# Patient Record
Sex: Female | Born: 1965 | Race: White | Hispanic: No | Marital: Married | Smoking: Former smoker
Health system: Southern US, Community
[De-identification: ages and names within clinical notes are randomized; demographics above are authoritative.]

## PROBLEM LIST (undated history)

## (undated) DIAGNOSIS — R55 Syncope and collapse: Secondary | ICD-10-CM

## (undated) DIAGNOSIS — F32A Depression, unspecified: Secondary | ICD-10-CM

## (undated) DIAGNOSIS — G4700 Insomnia, unspecified: Secondary | ICD-10-CM

## (undated) DIAGNOSIS — M1990 Unspecified osteoarthritis, unspecified site: Secondary | ICD-10-CM

## (undated) DIAGNOSIS — E7800 Pure hypercholesterolemia, unspecified: Secondary | ICD-10-CM

## (undated) DIAGNOSIS — R569 Unspecified convulsions: Secondary | ICD-10-CM

## (undated) DIAGNOSIS — E039 Hypothyroidism, unspecified: Secondary | ICD-10-CM

## (undated) DIAGNOSIS — C539 Malignant neoplasm of cervix uteri, unspecified: Secondary | ICD-10-CM

## (undated) DIAGNOSIS — F419 Anxiety disorder, unspecified: Secondary | ICD-10-CM

## (undated) DIAGNOSIS — Z923 Personal history of irradiation: Secondary | ICD-10-CM

## (undated) DIAGNOSIS — M19079 Primary osteoarthritis, unspecified ankle and foot: Secondary | ICD-10-CM

## (undated) HISTORY — PX: ABDOMINAL HYSTERECTOMY: SHX81

## (undated) HISTORY — DX: Personal history of irradiation: Z92.3

## (undated) HISTORY — PX: WISDOM TOOTH EXTRACTION: SHX21

## (undated) HISTORY — PX: ANKLE SURGERY: SHX546

## (undated) HISTORY — DX: Depression, unspecified: F32.A

## (undated) HISTORY — DX: Insomnia, unspecified: G47.00

## (undated) HISTORY — DX: Primary osteoarthritis, unspecified ankle and foot: M19.079

## (undated) HISTORY — DX: Anxiety disorder, unspecified: F41.9

## (undated) HISTORY — DX: Pure hypercholesterolemia, unspecified: E78.00

## (undated) HISTORY — DX: Hypothyroidism, unspecified: E03.9

## (undated) HISTORY — DX: Syncope and collapse: R55

---

## 1998-03-06 ENCOUNTER — Other Ambulatory Visit: Admission: RE | Admit: 1998-03-06 | Discharge: 1998-03-06 | Payer: Self-pay | Admitting: Gynecology

## 2000-02-20 ENCOUNTER — Other Ambulatory Visit: Admission: RE | Admit: 2000-02-20 | Discharge: 2000-02-20 | Payer: Self-pay | Admitting: Gynecology

## 2001-03-11 ENCOUNTER — Encounter: Payer: Self-pay | Admitting: Internal Medicine

## 2001-03-11 ENCOUNTER — Encounter: Admission: RE | Admit: 2001-03-11 | Discharge: 2001-03-11 | Payer: Self-pay | Admitting: Internal Medicine

## 2001-11-03 ENCOUNTER — Other Ambulatory Visit: Admission: RE | Admit: 2001-11-03 | Discharge: 2001-11-03 | Payer: Self-pay | Admitting: Gynecology

## 2003-03-15 ENCOUNTER — Other Ambulatory Visit: Admission: RE | Admit: 2003-03-15 | Discharge: 2003-03-15 | Payer: Self-pay | Admitting: Gynecology

## 2004-04-11 ENCOUNTER — Other Ambulatory Visit: Admission: RE | Admit: 2004-04-11 | Discharge: 2004-04-11 | Payer: Self-pay | Admitting: Gynecology

## 2005-02-14 ENCOUNTER — Encounter: Admission: RE | Admit: 2005-02-14 | Discharge: 2005-02-14 | Payer: Self-pay | Admitting: Internal Medicine

## 2008-08-08 ENCOUNTER — Ambulatory Visit: Payer: Self-pay | Admitting: Internal Medicine

## 2010-04-25 ENCOUNTER — Encounter: Admission: RE | Admit: 2010-04-25 | Discharge: 2010-04-25 | Payer: Self-pay | Admitting: Obstetrics and Gynecology

## 2010-06-12 ENCOUNTER — Encounter: Payer: Self-pay | Admitting: Internal Medicine

## 2010-11-17 ENCOUNTER — Encounter (HOSPITAL_COMMUNITY): Payer: Self-pay | Admitting: Obstetrics and Gynecology

## 2010-11-26 NOTE — Miscellaneous (Signed)
Summary: Orders/[facility]  Orders/[facility]   Imported By: Lester  06/17/2010 08:42:47  _____________________________________________________________________  External Attachment:    Type:   Image     Comment:   External Document

## 2011-04-14 ENCOUNTER — Ambulatory Visit (INDEPENDENT_AMBULATORY_CARE_PROVIDER_SITE_OTHER): Payer: BC Managed Care – PPO | Admitting: Internal Medicine

## 2011-04-14 ENCOUNTER — Encounter: Payer: Self-pay | Admitting: Internal Medicine

## 2011-04-14 VITALS — BP 124/86 | HR 82 | Temp 97.7°F | Ht 69.0 in | Wt 167.0 lb

## 2011-04-14 DIAGNOSIS — G4700 Insomnia, unspecified: Secondary | ICD-10-CM

## 2011-04-23 ENCOUNTER — Encounter: Payer: Self-pay | Admitting: Internal Medicine

## 2011-04-23 DIAGNOSIS — G4700 Insomnia, unspecified: Secondary | ICD-10-CM | POA: Insufficient documentation

## 2011-04-23 NOTE — Progress Notes (Signed)
  Subjective:    Patient ID: Ashley Hammond, female    DOB: 1966-02-24, 45 y.o.   MRN: [id number]  HPI not seen since 2009. In today with complaint of cough. No documented fever or chills. Started with recent respiratory infection. Patient initially thought it could be allergy related. Over-the-counter medications have not helped. Cough is nonproductive. Has been going on a couple of weeks. Also continues to have trouble with insomnia. Asking for refill on Ambien    Review of Systems     Objective:   Physical Exam HEENT: Pharynx is clear TMs are clear neck is supple without adenopathy chest clear to auscultation without rales or wheezing        Assessment & Plan:  Bronchitis  Insomnia  Samples of Zutipro cough syrup- one tsp hs for 7-10 days as needed. Prescription for Biaxin 500 mg bid for 10 days. Refill Ambien 10mg  (#30) with no refill 1/2-1 tab hs prn sleep. Pharmacy is [facility] [phone]

## 2011-04-23 NOTE — Patient Instructions (Signed)
Take meds as prescribed- call if not better in 2 weeks or sooner for

## 2013-07-26 ENCOUNTER — Encounter: Payer: Self-pay | Admitting: Family Medicine

## 2013-07-26 ENCOUNTER — Ambulatory Visit (INDEPENDENT_AMBULATORY_CARE_PROVIDER_SITE_OTHER): Payer: No Typology Code available for payment source | Admitting: Family Medicine

## 2013-07-26 VITALS — BP 147/75 | HR 94 | Ht 69.0 in | Wt 175.0 lb

## 2013-07-26 DIAGNOSIS — M25579 Pain in unspecified ankle and joints of unspecified foot: Secondary | ICD-10-CM

## 2013-07-26 DIAGNOSIS — M25571 Pain in right ankle and joints of right foot: Secondary | ICD-10-CM

## 2013-07-26 NOTE — Patient Instructions (Addendum)
You have ankle instability. Do home ankle strengthening exercises 3 sets of 10 once a day at least the next 6 weeks. Icing as needed 15 minutes at a time 3-4 times a day. Ibuprofen 600mg  three times a day with food OR aleve 2 tabs twice a day with food for pain and inflammation. Wear orthotics regularly when up and walking around. Small heel lift on right side - see if this makes a difference over the next few weeks. If the cyst is bothering you we can aspirate and inject it. Consider Dr. Jari Sportsman active series insoles.  We could also make you new custom orthotics when these ones break down (I suspect it will be about 6 more months before they break down). Consider formal physical therapy. Follow up with me as needed.

## 2013-07-27 ENCOUNTER — Encounter: Payer: Self-pay | Admitting: Family Medicine

## 2013-07-27 DIAGNOSIS — M25571 Pain in right ankle and joints of right foot: Secondary | ICD-10-CM | POA: Insufficient documentation

## 2013-07-27 NOTE — Assessment & Plan Note (Signed)
exam benign currently except for instability.  Pain is likely related to this, mild overpronation with ankle subluxation.  Start by using orthotics regularly (current ones ok but likely to break down over next 4-6 months).  Home exercise program reviewed for ankle.  Small heel lift for right shoe.  Nsaids, icing, rest as much as possible.  MSK u/s showed swollen area is a cyst, not bony.  Consider formal PT, new orthotics if not improving.

## 2013-07-27 NOTE — Progress Notes (Signed)
Patient ID: Ashley Hammond, female   DOB: 07/12/1966, 47 y.o.   MRN: [id number]  PCP: No primary provider on file.  Subjective:   HPI: Patient is a 47 y.o. female here for right ankle pain.  Patient reports she has had over 4 years of right ankle/foot pain. She stands a lot at work and notices pain is worse after work but not in one specific place. Can be in different locations of foot/ankle. New swollen area medial ankle past 2 months but not hurting here currently. Was having knee pain this side also but this has resolved. Wears custom orthotics made June 2013 - some benefit with these. Has not tried physical therapy, other treatments.  History reviewed. No pertinent past medical history.  No current outpatient prescriptions on file prior to visit.   No current facility-administered medications on file prior to visit.    History reviewed. No pertinent past surgical history.  No Known Allergies  History   Social History  . Marital Status: Single    Spouse Name: N/A    Number of Children: N/A  . Years of Education: N/A   Occupational History  . Not on file.   Social History Main Topics  . Smoking status: Current Every Day Smoker -- 0.30 packs/day    Types: Cigarettes  . Smokeless tobacco: Never Used  . Alcohol Use: 4.2 oz/week    7 Glasses of wine per week  . Drug Use: Not on file  . Sexual Activity: Not on file   Other Topics Concern  . Not on file   Social History Narrative  . No narrative on file    Family History  Problem Relation Age of Onset  . Diabetes Brother   . Sudden death Neg Hx   . Hyperlipidemia Neg Hx   . Heart attack Neg Hx   . Hypertension Neg Hx     BP 147/75  Pulse 94  Ht 5\' 9"  (1.753 m)  Wt 175 lb (79.379 kg)  BMI 25.83 kg/m2  Review of Systems: See HPI above.    Objective:  Physical Exam:  Gen: NAD  Right ankle/foot: Firm circumferential swollen area over medial malleolus.  No other bruising, deformity.  Ankle with  mild subluxation when standing.  Mild overpronation. FROM without pain.  5/5 strength. No TTP currently throughout foot/ankle. 1+ ant drawer and talar tilt.   Negative syndesmotic compression. Thompsons test negative. NV intact distally. Right leg 92cm, left 92.5cm    Assessment & Plan:  1. Right ankle/foot pain - exam benign currently except for instability.  Pain is likely related to this, mild overpronation with ankle subluxation.  Start by using orthotics regularly (current ones ok but likely to break down over next 4-6 months).  Home exercise program reviewed for ankle.  Small heel lift for right shoe.  Nsaids, icing, rest as much as possible.  MSK u/s showed swollen area is a cyst, not bony.  Consider formal PT, new orthotics if not improving.

## 2014-01-17 ENCOUNTER — Ambulatory Visit: Payer: Self-pay | Admitting: Internal Medicine

## 2014-01-17 ENCOUNTER — Telehealth: Payer: Self-pay | Admitting: Internal Medicine

## 2014-01-17 NOTE — Telephone Encounter (Signed)
Not seen since 2012. Made appt today for sinus infection symptoms then called later in the morning to cancel appt.

## 2017-05-04 ENCOUNTER — Ambulatory Visit
Admission: RE | Admit: 2017-05-04 | Discharge: 2017-05-04 | Disposition: A | Discharge status: Home / Self Care | Payer: 59 | Source: Ambulatory Visit | Attending: Sports Medicine | Admitting: Sports Medicine

## 2017-05-04 ENCOUNTER — Encounter: Payer: Self-pay | Admitting: Sports Medicine

## 2017-05-04 ENCOUNTER — Ambulatory Visit (INDEPENDENT_AMBULATORY_CARE_PROVIDER_SITE_OTHER): Payer: 59 | Admitting: Sports Medicine

## 2017-05-04 VITALS — BP 132/88 | Ht 69.0 in | Wt 180.0 lb

## 2017-05-04 DIAGNOSIS — M25512 Pain in left shoulder: Secondary | ICD-10-CM

## 2017-05-04 DIAGNOSIS — M25571 Pain in right ankle and joints of right foot: Secondary | ICD-10-CM

## 2017-05-04 NOTE — Progress Notes (Signed)
Subjective:    Patient ID: Ashley Hammond , female   DOB: September 27, 1966 , 51 y.o..   MRN: [id number]  HPI  CYANNA NEACE is here for:  1. Right ankle pain/swelling: Chronic and intermittent. Patient has a history of intermittient right foot/ankle pain and swelling x 8 years. No previous trauma. Was last seen in 2014 for right ankle pain and was given shoe inserts. She has not ever tried any physical therapy. Patient notes that at one point she had a cyst on the medial aspect of her ankle but this disappeared years ago. She notes that sometimes she feels like her foot catches and goes out that she has never actually lost her balance. Her pain and swelling is better with rest and ice. 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 She does wear shoe inserts in her sneakers but these are very old and she doesn't think that they help anymore. Denies any weakness, tingling, numbness.  2. Left great Toe deviation: Started 1 year ago. Patient notes that the left great toe has been gradually moving medially. At one point her toe was rubbing against her shoes often that her toenail fell off. She denies any pain, weakness, numbness, tingling. She was just worried that all of a sudden her toe started moving away from the rest.  3. Left shoulder pain: Started about 1 year ago. Patient notes that the pain is intermittent and occurs several times a week. No trauma or inciting event. 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 She notes that the pain will worsen with increased level of activity. The pain gets better when she is not lifting heavy objects. Admits to some intermittent neck pain but is not clearly related to the shoulder. Denies any weakness, numbness, tingling.  Review of Systems: Per HPI.   Past Medical History: Patient Active Problem List   Diagnosis Date Noted  . Right ankle pain 07/27/2013  . Insomnia 04/23/2011     Medications: None  Social Hx:  reports that she has been smoking Cigarettes.  She has been smoking about 0.30 packs per day. She has never used smokeless tobacco.   Objective:   BP 132/88   Ht 5\' 9"  (1.753 m)   Wt 180 lb (81.6 kg)   BMI 26.58 kg/m  Physical Exam  Gen: NAD, alert, cooperative with exam, well-appearing  Left Shoulder: Inspection reveals no abnormalities, atrophy or asymmetry. Palpation is normal with no tenderness over AC joint or bicipital groove. ROM is full in all planes. Rotator cuff strength normal throughout with exception of 4-5 strength on external rotation  No signs of impingement with negative Neer and Hawkin's tests, empty can sign. Speeds and Yergason's tests normal. Normal scapular function observed. No painful arc and no drop arm sign.  Right Ankle & Foot: Visible swelling in medial and lateral aspects of ankle. Foot tends to supinate. No ecchymosis, erythema, ulcers, calluses, blister Arch: Slight Pes cavus  Toes: no claw or hammer deformity  Nontender throughout Full in plantarflexion, dorsiflexion, inversion, and eversion of the foot; flexion and extension of the toes Strength: 5/5 in all directions. Sensation: intact Stable lateral and medial ligaments; Negative Anterior drawer test Vascular: intact w/ dorsalis pedis & posterior tibialis pulses 2+  Left Ankle & Foot: Great toe medially deviated. Foot tends to supinate.  No visible swelling, ecchymosis, erythema, ulcers, calluses, blister Arch: Slight  Pes cavus  Toes: no claw or hammer deformity  Nontender throughout Toes: no claw or hammer deformity  Nontender throughout Full in plantarflexion, dorsiflexion, inversion, and eversion of the foot; flexion and extension of the toes Strength: 5/5 in all directions. Sensation: intact Stable lateral and medial ligaments; Negative Anterior drawer test Vascular: intact w/ dorsalis pedis & posterior tibialis pulses 2+  Assessment & Plan:     1. Right ankle pain and swelling: Exam showing swelling in medial and lateral compartments with a slightly supinated foot. Suspect that this pain and swelling is coming from foot supination deviation.  -Patient will need custom foot orthotics. Will come in to the office in 1 month to receive custom orthotic pads with bilateral fifth ray post's  - We'll obtain a right ankle x-ray -Discussed wearing supportive shoes -Right ankle compression sleeve  -NSAIDS PRN  2. Left great toe medial deviation: Patient has collapsible transverse arch on left foot causing left great toe medial deviation.  This could be due to her slight supination deviation of the foot but unclear.  -Patient will need custom foot orthotics. Will come in to the office in 1 month to receive custom orthotic pads with bilateral fifth ray post's as well as a left metatarsal pad.  -Discussed wearing supportive shoes  3. Left shoulder pain: Likely secondary to some infraspinatus tendinopathy -NSAIDS PRN -Avoid heavy lifting if possible -Discussed strengthening exercises -Return in 1 month  Smitty Cords, MD [place], PGY-3  Patient seen and evaluated with the resident. I agree with the above plan of care. We will get an x-ray of her right ankle. She'll try a compression sleeve and she will follow-up in one month for custom orthotics. We will need to do a gait analysis as I suspect she supinates and will need bilateral fifth ray posts. Her left shoulder pain is likely secondary to some infraspinatus tendinopathy. We've given her a Jobe home exercise program and we will reevaluate this again at follow-up.

## 2017-05-27 ENCOUNTER — Encounter: Payer: 59 | Admitting: Family Medicine

## 2017-06-01 ENCOUNTER — Encounter: Payer: 59 | Admitting: Sports Medicine

## 2017-06-15 ENCOUNTER — Ambulatory Visit (INDEPENDENT_AMBULATORY_CARE_PROVIDER_SITE_OTHER): Payer: 59 | Admitting: Sports Medicine

## 2017-06-15 ENCOUNTER — Encounter: Payer: Self-pay | Admitting: Sports Medicine

## 2017-06-15 VITALS — BP 118/86 | Ht 69.0 in | Wt 180.0 lb

## 2017-06-15 DIAGNOSIS — M25571 Pain in right ankle and joints of right foot: Secondary | ICD-10-CM

## 2017-06-15 DIAGNOSIS — G8929 Other chronic pain: Secondary | ICD-10-CM

## 2017-06-15 NOTE — Progress Notes (Signed)
   [facility] [address] [place], [place] [address] Phone: [phone] Fax: [phone]   Patient Name: Ashley Hammond Date of Birth: 23-Apr-1966 Medical Record Number: [id number] Gender: female Date of Encounter: 06/15/2017  History of Present Illness:  Ashley Hammond is a 51 y.o. very pleasant female patient who presents with the following:  Right ankle pain: Patient was seen in July for +23yr history of right ankle swelling and pain. At the time, plan was to obtain Xray of R ankle, use NSAIDs PRN and use a R ankle compression sleeve. XR showed lateral ligamentous disruption. She returns today for f/u and for custom orthotics. Patient states that she has had custom orthotics for the last couple of years which do provide some relief of her pain - she wears them even in the house to provide relief with daily activities. Patient infrequently uses NSAIDs with minimal relief. She has not used ice consistently. She did not tolerate the ankle compression sleeve. She denies injury to ankle since last visit or significant change.   Past Medical, Surgical, Social, and Family History Reviewed. Medications and Allergies reviewed and all updated if necessary.  Review of Systems:  Per HPI  Physical Examination: Vitals:   06/15/17 1346  BP: 118/86   Vitals:   06/15/17 1346  Weight: 180 lb (81.6 kg)  Height: 5\' 9"  (1.753 m)   Body mass index is 26.58 kg/m.  Constitutional: NAD, pleasant Left LE: 1+ talar tilt; arch collapse Right LE: mild soft tissue swelling at ankle; no point tenderness; negative anterior drawer test at ATF; 1+ talar tilt  Assessment and Plan:  Right ankle pain and swelling: Patient with multiple years of right ankle pain and swelling with XR evidence of lateral ligamentous laxity, though exam is much less remarkable. At this time, patient's symptoms and exam do not warrant surgical intervention, however we did discuss  possibility in the future if symptoms were to become intolerable with supportive care. She was fitted with size 10 custom orthotics today in office with added cushion.  Plan: --advised on consistent use of orthotics and supportive shoes when able --advised on use of ice --f/u PRN  Alphonzo Grieve, MD   Patient seen and evaluated with the resident. I agree with the above plan of care. Custom orthotics were constructed for the patient today. A total of 30 minutes was spent with the patient with greater than 50% of the time spent in face-to-face consultation discussing orthotic construction, instruction, and fitting. Patient's x-ray shows evidence of chronic lateral ankle laxity/instability but she does not have significant lateral instability on clinical exam. She is not interested in surgery. Hopefully the custom orthotics will help. Follow-up as needed.

## 2017-06-15 NOTE — Assessment & Plan Note (Signed)
Patient with multiple years of right ankle pain and swelling with XR evidence of lateral ligamentous laxity, though exam is much less remarkable. At this time, patient's symptoms and exam do not warrant surgical intervention, however we did discuss possibility in the future if symptoms were to become intolerable with supportive care. She was fitted with size 10 custom orthotics today in office with added cushion.  Plan: --advised on consistent use of orthotics and supportive shoes when able --advised on use of ice --f/u PRN

## 2018-03-02 ENCOUNTER — Telehealth: Payer: Self-pay

## 2018-03-02 NOTE — Telephone Encounter (Signed)
CB# [phone] (M)

## 2018-03-02 NOTE — Telephone Encounter (Signed)
Patient called is wanting to come in to see Dr. Renold Genta she c/o fatigue and "is having trouble taking in a deep breath". Spoke with Dr. Renold Genta about it and she agreed to see her tomorrow at 4:00pm called patient with no answer.

## 2018-03-10 ENCOUNTER — Ambulatory Visit (INDEPENDENT_AMBULATORY_CARE_PROVIDER_SITE_OTHER): Payer: No Typology Code available for payment source | Admitting: Internal Medicine

## 2018-03-10 ENCOUNTER — Encounter: Payer: Self-pay | Admitting: Internal Medicine

## 2018-03-10 VITALS — BP 126/98 | HR 87 | Ht 69.0 in

## 2018-03-10 DIAGNOSIS — R748 Abnormal levels of other serum enzymes: Secondary | ICD-10-CM

## 2018-03-10 DIAGNOSIS — E559 Vitamin D deficiency, unspecified: Secondary | ICD-10-CM | POA: Diagnosis not present

## 2018-03-10 DIAGNOSIS — R7989 Other specified abnormal findings of blood chemistry: Secondary | ICD-10-CM | POA: Diagnosis not present

## 2018-03-10 DIAGNOSIS — Z0000 Encounter for general adult medical examination without abnormal findings: Secondary | ICD-10-CM

## 2018-03-10 DIAGNOSIS — E7800 Pure hypercholesterolemia, unspecified: Secondary | ICD-10-CM

## 2018-03-10 DIAGNOSIS — Z1231 Encounter for screening mammogram for malignant neoplasm of breast: Secondary | ICD-10-CM | POA: Diagnosis not present

## 2018-03-10 DIAGNOSIS — R5383 Other fatigue: Secondary | ICD-10-CM

## 2018-03-10 DIAGNOSIS — G4709 Other insomnia: Secondary | ICD-10-CM

## 2018-03-10 MED ORDER — BUPROPION HCL ER (XL) 150 MG PO TB24: 150.0000 mg | ORAL_TABLET | Freq: Every day | ORAL | 0 refills | Status: DC

## 2018-03-10 NOTE — Progress Notes (Signed)
   Subjective:    Patient ID: Ashley Hammond, female    DOB: 1966/01/31, 52 y.o.   MRN: [id number]  HPI 52 year old White Female not seen here since June 2012 in today for physical examination and evaluation of fatigue.  She also has chronic right ankle issues.  Says her husband is retired.  He previously worked for [employer].  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  She has been in business for about 26 years.  Says husband had onset of heart failure last fall and was hospitalized.  They had large medical bills as a result despite having medical insurance.  Has seen Dr. Gaetano Net for GYN exam in the past..  No surgeries.  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  Completed 4 years of college.  History of smoking about 5 cigarettes daily.  Social alcohol consumption.  Family history: One brother with history of diabetes.  Another brother alive and well.  She has a daughter and a son in good health.      Review of Systems issues recently with fatigue and malaise which she thinks may be due to depression and the fact that her ankle hurts.  She cannot exercise as much as she would like to.  Wants to see a specialist regarding ankle issue.  Issues with insomnia.  Her husband has had some heart issues and she thinks that also contributed to stress and depression.  They had a large hospital bill and had to pay out of pocket.     Objective:   Physical Exam        Assessment & Plan:  Fatigue-possibly depression-trial of Wellbutrin and follow-up in 4 to 6 weeks  Chronic right ankle pain-patient thinks she would like to see Dr. Doran [facility] at [facility]  Mild elevation of SGOT/SGPT.  Could be due to alcohol consumption.  Does not take excessive amount of analgesics.  Elevated LDL cholesterol-watch diet.  Cannot exercise due to ankle  pain.  Will need follow-up in 6 months.  She has a high HDL cholesterol  Health maintenance-have mammogram  Insomnia-prescription for Ambien 10 mg at bedtime  Vitamin D deficiency-take 2000 units vitamin D3 daily  Chronic right ankle pain-is seen Dr. Micheline Chapman.  Was prescribed custom orthotics but has not helped her right ankle pain.  X-ray of ankle July 2018 was unremarkable other than mild soft tissue swelling medially and laterally.  Thought to have lateral ligamentous disruption.

## 2018-03-12 ENCOUNTER — Other Ambulatory Visit: Payer: Self-pay | Admitting: Internal Medicine

## 2018-03-12 DIAGNOSIS — R5383 Other fatigue: Secondary | ICD-10-CM

## 2018-03-15 LAB — COMPLETE METABOLIC PANEL WITH GFR
AG RATIO: 1.7 (calc) (ref 1.0–2.5)
ALBUMIN MSPROF: 4.6 g/dL (ref 3.6–5.1)
ALT: 74 U/L — ABNORMAL HIGH (ref 6–29)
AST: 46 U/L — AB (ref 10–35)
Alkaline phosphatase (APISO): 93 U/L (ref 33–130)
BUN: 10 mg/dL (ref 7–25)
CALCIUM: 9.8 mg/dL (ref 8.6–10.4)
CHLORIDE: 103 mmol/L (ref 98–110)
CO2: 27 mmol/L (ref 20–32)
Creat: 0.67 mg/dL (ref 0.50–1.05)
GFR, EST NON AFRICAN AMERICAN: 100 mL/min/{1.73_m2} (ref >=60)
GFR, Est African American: 116 mL/min/{1.73_m2} (ref >=60)
GLOBULIN: 2.7 g/dL (ref 1.9–3.7)
Glucose, Bld: 100 mg/dL — ABNORMAL HIGH (ref 65–99)
Potassium: 4.6 mmol/L (ref 3.5–5.3)
SODIUM: 141 mmol/L (ref 135–146)
Total Bilirubin: 0.7 mg/dL (ref 0.2–1.2)
Total Protein: 7.3 g/dL (ref 6.1–8.1)

## 2018-03-15 LAB — CBC WITH DIFFERENTIAL/PLATELET
BASOS ABS: 47 {cells}/uL (ref 0–200)
Basophils Relative: 0.7 %
EOS ABS: 101 {cells}/uL (ref 15–500)
EOS PCT: 1.5 %
HEMATOCRIT: 46.4 % — AB (ref 35.0–45.0)
HEMOGLOBIN: 16.1 g/dL — AB (ref 11.7–15.5)
LYMPHS ABS: 2104 {cells}/uL (ref 850–3900)
MCH: 32.7 pg (ref 27.0–33.0)
MCHC: 34.7 g/dL (ref 32.0–36.0)
MCV: 94.1 fL (ref 80.0–100.0)
MPV: 10.3 fL (ref 7.5–12.5)
Monocytes Relative: 8.4 %
NEUTROS ABS: 3886 {cells}/uL (ref 1500–7800)
NEUTROS PCT: 58 %
Platelets: 244 10*3/uL (ref 140–400)
RBC: 4.93 10*6/uL (ref 3.80–5.10)
RDW: 11.4 % (ref 11.0–15.0)
Total Lymphocyte: 31.4 %
WBC: 6.7 10*3/uL (ref 3.8–10.8)
WBCMIX: 563 {cells}/uL (ref 200–950)

## 2018-03-15 LAB — LIPID PANEL
CHOL/HDL RATIO: 2.4 (calc) (ref <5.0)
CHOLESTEROL: 270 mg/dL — AB (ref <200)
HDL: 112 mg/dL (ref >50)
LDL Cholesterol (Calc): 142 mg/dL (calc) — ABNORMAL HIGH
Non-HDL Cholesterol (Calc): 158 mg/dL (calc) — ABNORMAL HIGH (ref <130)
Triglycerides: 72 mg/dL (ref <150)

## 2018-03-15 LAB — TEST AUTHORIZATION 2

## 2018-03-15 LAB — VITAMIN B12: Vitamin B-12: 382 pg/mL (ref 200–1100)

## 2018-03-15 LAB — TSH: TSH: 3.82 m[IU]/L

## 2018-03-15 LAB — VITAMIN D 25 HYDROXY (VIT D DEFICIENCY, FRACTURES): VIT D 25 HYDROXY: 22 ng/mL — AB (ref 30–100)

## 2018-03-24 ENCOUNTER — Encounter: Payer: Self-pay | Admitting: Internal Medicine

## 2018-03-24 NOTE — Patient Instructions (Addendum)
Take Ambien 10 mg at bedtime for sleep and start Wellbutrin XL 150 mg daily.  Follow-up in 4 to 6 weeks.  Take 2000 units vitamin D3 daily.  We need to repeat liver functions upon your return.

## 2018-04-02 DIAGNOSIS — M19071 Primary osteoarthritis, right ankle and foot: Secondary | ICD-10-CM | POA: Insufficient documentation

## 2018-04-02 DIAGNOSIS — M2032 Hallux varus (acquired), left foot: Secondary | ICD-10-CM | POA: Insufficient documentation

## 2018-04-05 ENCOUNTER — Telehealth: Payer: Self-pay

## 2018-04-05 NOTE — Telephone Encounter (Signed)
Patient walked in this afternoon.  States that she was at her eye doctor and thought it would just be better if she just came by to state what she needed.  States that she is thinking she may need an increase in her Wellbutrin XL 150mg .  She has never been on this medication before.  She is still really tired and we are almost 1 month into the medication.    Provided med appointment for Thursday, 6/13 @ 4pm.

## 2018-04-08 ENCOUNTER — Encounter: Payer: Self-pay | Admitting: Internal Medicine

## 2018-04-08 ENCOUNTER — Ambulatory Visit (INDEPENDENT_AMBULATORY_CARE_PROVIDER_SITE_OTHER): Payer: No Typology Code available for payment source | Admitting: Internal Medicine

## 2018-04-08 VITALS — BP 128/90 | HR 74 | Temp 98.6°F | Wt 200.0 lb

## 2018-04-08 DIAGNOSIS — M25571 Pain in right ankle and joints of right foot: Secondary | ICD-10-CM

## 2018-04-08 DIAGNOSIS — G4709 Other insomnia: Secondary | ICD-10-CM

## 2018-04-08 DIAGNOSIS — E7800 Pure hypercholesterolemia, unspecified: Secondary | ICD-10-CM | POA: Diagnosis not present

## 2018-04-08 DIAGNOSIS — F324 Major depressive disorder, single episode, in partial remission: Secondary | ICD-10-CM | POA: Diagnosis not present

## 2018-04-08 DIAGNOSIS — R5383 Other fatigue: Secondary | ICD-10-CM | POA: Diagnosis not present

## 2018-04-08 DIAGNOSIS — G8929 Other chronic pain: Secondary | ICD-10-CM

## 2018-04-08 MED ORDER — BUPROPION HCL ER (XL) 300 MG PO TB24: 300.0000 mg | ORAL_TABLET | Freq: Every day | ORAL | 1 refills | Status: DC

## 2018-04-08 NOTE — Progress Notes (Signed)
   Subjective:    Patient ID: Ashley Hammond, female    DOB: 01-Mar-1966, 53 y.o.   MRN: [id number]  HPI Patient presented for physical exam and evaluation of anxiety depression in May.  She has been having chronic ankle pain for some time and recently saw Dr. Doran Durand who is referring her to do you to see a specialist regarding arthroplasty of foot and ankle.   At last visit, she was started on Wellbutrin 150 mg XL and began to feel better fairly quickly.  She takes  Ambien for sleep.  She is looking into getting different medical insurance which will cover her employees as well as she and her husband.  She would like to go up on the Wellbutrin.  She was also found to have significant hyperlipidemia with total cholesterol of 270 and LDL cholesterol of 142.  She feels this is due to inactivity due to her ankle pain.  Mentioned to her she can take Crestor 3 times weekly but she declines at this time.   Review of Systems     Objective:   Physical Exam  Not examined but spent 20 minutes speaking with her about these issues      Assessment & Plan:  Chronic right ankle pain-will be referred to [facility] by local orthopedist  Insomnia-continue with Ambien  Depression-improved as well as fatigue with Wellbutrin XL 150 mg daily so we will go up to XL 300 mg daily.  She will let me know how things are going by phone.  Pure hypercholesterolemia- she cannot exercise as much as she would like.  We will recheck lipid panel in 6 months.  She does not want to be on statin medication at this time.

## 2018-04-24 DIAGNOSIS — E7800 Pure hypercholesterolemia, unspecified: Secondary | ICD-10-CM | POA: Insufficient documentation

## 2018-04-24 NOTE — Patient Instructions (Signed)
Continue to work on diet and try to exercise when possible.  Increase Wellbutrin XL to 300 mg daily.  Repeat lipid panel in 6 months.

## 2018-06-04 ENCOUNTER — Other Ambulatory Visit: Payer: Self-pay | Admitting: Internal Medicine

## 2018-09-01 ENCOUNTER — Telehealth: Payer: Self-pay | Admitting: Internal Medicine

## 2018-09-01 NOTE — Telephone Encounter (Signed)
Verbal order to refill patients 300mg  Wellbutrin.  However, patient last seen in June, 2019.  Patient needs to have 6 month visit with Lipid panel done for pure hypercholersterolemia.  And 6 month OV.    Thank you.

## 2018-09-01 NOTE — Telephone Encounter (Signed)
LM for patient to call the office to set up appointment for 6 month follow up and fasting labs.  Once she does so, we are happy to refill medication.

## 2018-09-02 MED ORDER — BUPROPION HCL ER (XL) 300 MG PO TB24: 300.0000 mg | ORAL_TABLET | Freq: Every day | ORAL | 0 refills | Status: DC

## 2018-09-02 NOTE — Telephone Encounter (Signed)
Sent electronically 

## 2018-09-02 NOTE — Telephone Encounter (Signed)
Patient called back to book 6 month recheck.  Labs 12/3 @ 11:30 and 6 month recheck on 12/5 @ 3:45 p.m.    Please send medication to her pharmacy (enough to get her to her appointment).    Thank you.

## 2018-09-02 NOTE — Addendum Note (Signed)
Addended by: Mady Haagensen on: 09/02/2018 03:24 PM   Modules accepted: Orders

## 2018-09-28 ENCOUNTER — Other Ambulatory Visit: Payer: No Typology Code available for payment source | Admitting: Internal Medicine

## 2018-09-28 DIAGNOSIS — E7800 Pure hypercholesterolemia, unspecified: Secondary | ICD-10-CM

## 2018-09-28 LAB — LIPID PANEL
Cholesterol: 242 mg/dL — ABNORMAL HIGH (ref <200)
HDL: 121 mg/dL (ref >50)
LDL CHOLESTEROL (CALC): 106 mg/dL — AB
Non-HDL Cholesterol (Calc): 121 mg/dL (calc) (ref <130)
Total CHOL/HDL Ratio: 2 (calc) (ref <5.0)
Triglycerides: 61 mg/dL (ref <150)

## 2018-10-01 ENCOUNTER — Encounter: Payer: Self-pay | Admitting: Internal Medicine

## 2018-10-01 ENCOUNTER — Ambulatory Visit (INDEPENDENT_AMBULATORY_CARE_PROVIDER_SITE_OTHER): Payer: No Typology Code available for payment source | Admitting: Internal Medicine

## 2018-10-01 VITALS — BP 140/90 | HR 90 | Ht 69.0 in | Wt 188.0 lb

## 2018-10-01 DIAGNOSIS — G4709 Other insomnia: Secondary | ICD-10-CM | POA: Diagnosis not present

## 2018-10-01 DIAGNOSIS — F324 Major depressive disorder, single episode, in partial remission: Secondary | ICD-10-CM

## 2018-10-01 DIAGNOSIS — F411 Generalized anxiety disorder: Secondary | ICD-10-CM

## 2018-10-01 DIAGNOSIS — R5383 Other fatigue: Secondary | ICD-10-CM | POA: Diagnosis not present

## 2018-10-01 MED ORDER — ZOLPIDEM TARTRATE 10 MG PO TABS: ORAL_TABLET | ORAL | 2 refills | Status: DC

## 2018-10-01 MED ORDER — ALPRAZOLAM 0.5 MG PO TABS: 0.5000 mg | ORAL_TABLET | Freq: Every evening | ORAL | 0 refills | Status: DC | PRN

## 2018-10-01 MED ORDER — FLUOXETINE HCL 10 MG PO CAPS: 10.0000 mg | ORAL_CAPSULE | Freq: Every day | ORAL | 0 refills | Status: DC

## 2018-10-01 NOTE — Patient Instructions (Signed)
Xanax 0.5 mg twice a day as needed for anxiety surrounding surgery and labile hypertension. Continue Wellbutrin. Add Prozac 10 mg daily. Call with progress report around week of Christmas.

## 2018-10-25 NOTE — Progress Notes (Signed)
   Subjective:    Patient ID: Ashley Hammond, female    DOB: 04-16-1966, 52 y.o.   MRN: [id number]  HPI 52 year old Female in today to follow-up on several medical issues.  She is anxious about upcoming right ankle surgery at [facility].  Wellbutrin has worked well for her.  She felt more energetic and less depressed but would like to try something for anxiety.  Have given her Xanax to take for anxiety.  She will be started on Prozac 10 mg daily.  This SSRI might will help anxiety.  Ambien has been refilled.    Review of Systems remarks that her blood pressures been elevated in doctor's offices over the past several visits.  Likely due to anxiety.  Xanax may help with diet if she will take it prior to office visits.     Objective:   Physical Exam  Spent 15 minutes speaking with her about these issues and deciding on SSRI.  She wants to avoid weight gain.      Assessment & Plan:  Anxiety regarding upcoming ankle surgery at [facility].  Xanax prescribed  Depression-stable on Wellbutrin but will add Prozac for anxiety issues  Insomnia-has Ambien for sleep  Plan: She will call  with progress report regarding Prozac around week of Christmas.  Otherwise plan to see her May 2020.  Continue Wellbutrin.  Watch blood pressure.

## 2018-10-27 ENCOUNTER — Other Ambulatory Visit: Payer: Self-pay | Admitting: Internal Medicine

## 2018-10-27 DIAGNOSIS — C539 Malignant neoplasm of cervix uteri, unspecified: Secondary | ICD-10-CM

## 2018-10-27 HISTORY — DX: Malignant neoplasm of cervix uteri, unspecified: C53.9

## 2018-11-25 ENCOUNTER — Telehealth: Payer: Self-pay | Admitting: Internal Medicine

## 2018-11-25 ENCOUNTER — Encounter: Payer: Self-pay | Admitting: Internal Medicine

## 2018-11-25 MED ORDER — FLUOXETINE HCL 10 MG PO CAPS: ORAL_CAPSULE | ORAL | 1 refills | Status: DC

## 2018-11-25 NOTE — Telephone Encounter (Signed)
Generic Prozac refilled x 6 months

## 2018-12-31 ENCOUNTER — Other Ambulatory Visit: Payer: Self-pay | Admitting: Internal Medicine

## 2019-01-02 ENCOUNTER — Other Ambulatory Visit: Payer: Self-pay | Admitting: Internal Medicine

## 2019-01-02 NOTE — Telephone Encounter (Signed)
Needs CPE after May 15. Please book before refilling

## 2019-01-03 NOTE — Telephone Encounter (Signed)
Left message letting her know that she needs to schedule CPE before we can approve this.

## 2019-02-01 ENCOUNTER — Other Ambulatory Visit: Payer: Self-pay

## 2019-02-01 MED ORDER — ALPRAZOLAM 0.5 MG PO TABS: 0.5000 mg | ORAL_TABLET | Freq: Every evening | ORAL | 1 refills | Status: DC | PRN

## 2019-02-01 NOTE — Telephone Encounter (Signed)
Patient called back to schedule CPE in May and is requesting a refill.

## 2019-02-02 ENCOUNTER — Other Ambulatory Visit: Payer: Self-pay

## 2019-02-02 MED ORDER — BUPROPION HCL ER (XL) 300 MG PO TB24: ORAL_TABLET | ORAL | 2 refills | Status: DC

## 2019-03-22 ENCOUNTER — Other Ambulatory Visit: Payer: Self-pay

## 2019-03-22 ENCOUNTER — Other Ambulatory Visit: Payer: No Typology Code available for payment source | Admitting: Internal Medicine

## 2019-03-22 VITALS — Temp 97.9°F

## 2019-03-22 DIAGNOSIS — E7800 Pure hypercholesterolemia, unspecified: Secondary | ICD-10-CM

## 2019-03-22 DIAGNOSIS — E559 Vitamin D deficiency, unspecified: Secondary | ICD-10-CM

## 2019-03-22 DIAGNOSIS — G8929 Other chronic pain: Secondary | ICD-10-CM

## 2019-03-22 DIAGNOSIS — M25571 Pain in right ankle and joints of right foot: Secondary | ICD-10-CM

## 2019-03-22 DIAGNOSIS — F411 Generalized anxiety disorder: Secondary | ICD-10-CM

## 2019-03-22 DIAGNOSIS — Z0000 Encounter for general adult medical examination without abnormal findings: Secondary | ICD-10-CM

## 2019-03-22 DIAGNOSIS — F325 Major depressive disorder, single episode, in full remission: Secondary | ICD-10-CM

## 2019-03-22 DIAGNOSIS — G4709 Other insomnia: Secondary | ICD-10-CM

## 2019-03-24 ENCOUNTER — Ambulatory Visit (INDEPENDENT_AMBULATORY_CARE_PROVIDER_SITE_OTHER): Payer: 59 | Admitting: Internal Medicine

## 2019-03-24 ENCOUNTER — Other Ambulatory Visit: Payer: Self-pay

## 2019-03-24 ENCOUNTER — Encounter: Payer: Self-pay | Admitting: Internal Medicine

## 2019-03-24 VITALS — BP 140/90 | HR 104 | Ht 69.0 in | Wt 171.0 lb

## 2019-03-24 DIAGNOSIS — R748 Abnormal levels of other serum enzymes: Secondary | ICD-10-CM

## 2019-03-24 DIAGNOSIS — F419 Anxiety disorder, unspecified: Secondary | ICD-10-CM

## 2019-03-24 DIAGNOSIS — E039 Hypothyroidism, unspecified: Secondary | ICD-10-CM

## 2019-03-24 DIAGNOSIS — Z0000 Encounter for general adult medical examination without abnormal findings: Secondary | ICD-10-CM

## 2019-03-24 DIAGNOSIS — F32A Depression, unspecified: Secondary | ICD-10-CM

## 2019-03-24 DIAGNOSIS — F329 Major depressive disorder, single episode, unspecified: Secondary | ICD-10-CM

## 2019-03-24 DIAGNOSIS — E7800 Pure hypercholesterolemia, unspecified: Secondary | ICD-10-CM

## 2019-03-24 LAB — POCT URINALYSIS DIPSTICK
Appearance: NEGATIVE
Bilirubin, UA: NEGATIVE
Blood, UA: NEGATIVE
Glucose, UA: NEGATIVE
Ketones, UA: NEGATIVE
Leukocytes, UA: NEGATIVE
Nitrite, UA: NEGATIVE
Odor: NEGATIVE
Protein, UA: NEGATIVE
Spec Grav, UA: 1.015 (ref 1.010–1.025)
Urobilinogen, UA: 0.2 E.U./dL
pH, UA: 6.5 (ref 5.0–8.0)

## 2019-03-24 NOTE — Progress Notes (Signed)
   Subjective:    Patient ID: Ashley Hammond, female    DOB: April 28, 1966, 53 y.o.   MRN: [id number]  HPI 53 year old Female for health maintenance exam and evaluation of medical issues.  In January, had right ankle lateral ligament reconstruction surgery at [facility].  He has done well postoperatively.  No known drug allergies  History of depression, insomnia and vitamin D deficiency.  Recently went to the beach and consumed alcohol.  Now vitamin D level is high at 111.  Needs to cut back on amount of supplementation.  Her liver functions are elevated.  SGOT is 181 and SGPT is 162 and previously these were 46 and 74 a year ago.  She has significant hyperlipidemia with total cholesterol 273 and LDL cholesterol 117.  She has an elevated TSH of 4.54 and will be started on thyroid replacement therapy.  Hepatitis C antibody is negative.  CBC is within normal limits.  Was started on Prozac 10 mg daily in December 2019 because she was anxious about upcoming surgery.  Wellbutrin worked well for her fatigue and depression.  She is on 300 mg XL daily.  She has Ambien for insomnia.    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  24 years of college.  History of smoking about 5 cigarettes daily.  Family history: 1 brother with history of diabetes.  Another brother alive and.   Review of Systems continued issues with fatigue and malaise     Objective:   Physical Exam Vital signs reviewed.  Skin warm and dry.  Nodes none.  TMs and pharynx are clear.  Neck is supple without thyromegaly.  No carotid bruits.  Chest clear to auscultation.  Cardiac exam regular rate and rhythm normal S1 and S2 2.  Abdomen no hepatosplenomegaly masses or tenderness.  Extremities without edema.  Neuro no gross focal deficits.       Assessment & Plan:  Elevated liver functions-Hepatitis C  antibody obtained and is negative.  I am assuming her elevated liver functions are due to her recent beach trip with alcohol consumption and apparently was fairly heavy.  Follow-up in late June.  Cut back on alcohol consumption.  Discussion about this today.  Hypothyroidism-has elevated TSH and will be started on thyroid replacement therapy with follow-up in late June.  Anxiety and depression- continue current medications  Insomnia-has Ambien for sleep  Pure hypercholesterolemia-has not wanted to be on statin therapy.  May need to consider statin therapy if no improvement.  Needs to watch diet and exercise.  Status post right ankle reconstruction surgery at [facility] which appears to have been successful.  Elevated vitamin D level-cut back on vitamin D replacement.  Need to repeat level.    Plan: Follow-up here late June with repeat TSH, liver functions, lipid panel and office visit.  If depression not improving, she may need counseling.  Watch alcohol consumption.  Counseling provided.

## 2019-03-25 LAB — CBC WITH DIFFERENTIAL/PLATELET
Absolute Monocytes: 600 cells/uL (ref 200–950)
Basophils Absolute: 48 cells/uL (ref 0–200)
Basophils Relative: 0.6 %
Eosinophils Absolute: 88 cells/uL (ref 15–500)
Eosinophils Relative: 1.1 %
HCT: 44.8 % (ref 35.0–45.0)
Hemoglobin: 15.3 g/dL (ref 11.7–15.5)
Lymphs Abs: 1584 cells/uL (ref 850–3900)
MCH: 34.1 pg — ABNORMAL HIGH (ref 27.0–33.0)
MCHC: 34.2 g/dL (ref 32.0–36.0)
MCV: 99.8 fL (ref 80.0–100.0)
MPV: 10.3 fL (ref 7.5–12.5)
Monocytes Relative: 7.5 %
Neutro Abs: 5680 cells/uL (ref 1500–7800)
Neutrophils Relative %: 71 %
Platelets: 227 10*3/uL (ref 140–400)
RBC: 4.49 10*6/uL (ref 3.80–5.10)
RDW: 11.9 % (ref 11.0–15.0)
Total Lymphocyte: 19.8 %
WBC: 8 10*3/uL (ref 3.8–10.8)

## 2019-03-25 LAB — LIPID PANEL
Cholesterol: 273 mg/dL — ABNORMAL HIGH (ref <200)
HDL: 140 mg/dL (ref >=50)
LDL Cholesterol (Calc): 117 mg/dL (calc) — ABNORMAL HIGH
Non-HDL Cholesterol (Calc): 133 mg/dL (calc) — ABNORMAL HIGH (ref <130)
Total CHOL/HDL Ratio: 2 (calc) (ref <5.0)
Triglycerides: 68 mg/dL (ref <150)

## 2019-03-25 LAB — COMPLETE METABOLIC PANEL WITH GFR
AG Ratio: 1.8 (calc) (ref 1.0–2.5)
ALT: 162 U/L — ABNORMAL HIGH (ref 6–29)
AST: 181 U/L — ABNORMAL HIGH (ref 10–35)
Albumin: 4.5 g/dL (ref 3.6–5.1)
Alkaline phosphatase (APISO): 86 U/L (ref 37–153)
BUN: 9 mg/dL (ref 7–25)
CO2: 29 mmol/L (ref 20–32)
Calcium: 9.8 mg/dL (ref 8.6–10.4)
Chloride: 103 mmol/L (ref 98–110)
Creat: 0.75 mg/dL (ref 0.50–1.05)
GFR, Est African American: 105 mL/min/{1.73_m2} (ref >=60)
GFR, Est Non African American: 90 mL/min/{1.73_m2} (ref >=60)
Globulin: 2.5 g/dL (calc) (ref 1.9–3.7)
Glucose, Bld: 83 mg/dL (ref 65–99)
Potassium: 4.2 mmol/L (ref 3.5–5.3)
Sodium: 141 mmol/L (ref 135–146)
Total Bilirubin: 0.5 mg/dL (ref 0.2–1.2)
Total Protein: 7 g/dL (ref 6.1–8.1)

## 2019-03-25 LAB — TEST AUTHORIZATION

## 2019-03-25 LAB — VITAMIN D 25 HYDROXY (VIT D DEFICIENCY, FRACTURES): Vit D, 25-Hydroxy: 111 ng/mL — ABNORMAL HIGH (ref 30–100)

## 2019-03-25 LAB — HEPATITIS C ANTIBODY
Hepatitis C Ab: NONREACTIVE
SIGNAL TO CUT-OFF: 0.01 (ref <1.00)

## 2019-03-25 LAB — TSH: TSH: 4.56 mIU/L — ABNORMAL HIGH

## 2019-03-28 ENCOUNTER — Telehealth: Payer: Self-pay | Admitting: Certified Nurse Midwife

## 2019-03-28 NOTE — Telephone Encounter (Signed)
Called and left a message for patient to call back to schedule a new patient doctor referral appointment with our office to see ANY PROVIDER re: Pelvic Exam.

## 2019-03-29 ENCOUNTER — Telehealth: Payer: Self-pay | Admitting: Internal Medicine

## 2019-03-29 MED ORDER — LEVOTHYROXINE SODIUM 50 MCG PO TABS: 50.0000 ug | ORAL_TABLET | Freq: Every day | ORAL | 0 refills | Status: DC

## 2019-03-29 NOTE — Telephone Encounter (Signed)
Pt called in about Gynecology referral and wanted to know the name of the Dr that Dr Renold Genta recommend as well as she said Dr Renold Genta said something about starting thyroid medicine but didn't send anything

## 2019-03-29 NOTE — Telephone Encounter (Signed)
Levothyroxine was e-scribed, left message for patient.

## 2019-03-29 NOTE — Addendum Note (Signed)
Addended by: Mady Haagensen on: 03/29/2019 03:35 PM   Modules accepted: Orders

## 2019-03-29 NOTE — Telephone Encounter (Signed)
Call in Levothyroxine 0.05 mg daily #90 Take on empty stomach. The GYN is Dr. Talbert Nan [phone]

## 2019-04-06 NOTE — Telephone Encounter (Signed)
Called and left a message for patient to call back to schedule a new patient doctor referral appointment with our office to see ANY PROVIDER re: Pelvic Exam.

## 2019-04-17 DIAGNOSIS — R748 Abnormal levels of other serum enzymes: Secondary | ICD-10-CM | POA: Insufficient documentation

## 2019-04-17 DIAGNOSIS — E039 Hypothyroidism, unspecified: Secondary | ICD-10-CM | POA: Insufficient documentation

## 2019-04-17 DIAGNOSIS — F32A Depression, unspecified: Secondary | ICD-10-CM | POA: Insufficient documentation

## 2019-04-17 DIAGNOSIS — F329 Major depressive disorder, single episode, unspecified: Secondary | ICD-10-CM | POA: Insufficient documentation

## 2019-04-17 NOTE — Patient Instructions (Addendum)
Liver functions are elevated.  Watch alcohol consumption.  Follow-up in late June.  Begin thyroid replacement therapy.  May need to consider statin therapy for elevated cholesterol

## 2019-04-18 ENCOUNTER — Ambulatory Visit: Payer: 59 | Admitting: Obstetrics and Gynecology

## 2019-04-22 ENCOUNTER — Other Ambulatory Visit: Payer: 59 | Admitting: Internal Medicine

## 2019-04-22 ENCOUNTER — Other Ambulatory Visit: Payer: Self-pay

## 2019-04-22 DIAGNOSIS — E039 Hypothyroidism, unspecified: Secondary | ICD-10-CM

## 2019-04-22 DIAGNOSIS — R748 Abnormal levels of other serum enzymes: Secondary | ICD-10-CM

## 2019-04-22 DIAGNOSIS — E559 Vitamin D deficiency, unspecified: Secondary | ICD-10-CM

## 2019-04-22 NOTE — Progress Notes (Signed)
Labs only

## 2019-04-23 LAB — COMPLETE METABOLIC PANEL WITH GFR
AG Ratio: 1.8 (calc) (ref 1.0–2.5)
ALT: 20 U/L (ref 6–29)
AST: 27 U/L (ref 10–35)
Albumin: 4.4 g/dL (ref 3.6–5.1)
Alkaline phosphatase (APISO): 71 U/L (ref 37–153)
BUN: 10 mg/dL (ref 7–25)
CO2: 25 mmol/L (ref 20–32)
Calcium: 9.9 mg/dL (ref 8.6–10.4)
Chloride: 102 mmol/L (ref 98–110)
Creat: 1.01 mg/dL (ref 0.50–1.05)
GFR, Est African American: 73 mL/min/{1.73_m2} (ref >=60)
GFR, Est Non African American: 63 mL/min/{1.73_m2} (ref >=60)
Globulin: 2.5 g/dL (calc) (ref 1.9–3.7)
Glucose, Bld: 81 mg/dL (ref 65–99)
Potassium: 4.1 mmol/L (ref 3.5–5.3)
Sodium: 139 mmol/L (ref 135–146)
Total Bilirubin: 0.3 mg/dL (ref 0.2–1.2)
Total Protein: 6.9 g/dL (ref 6.1–8.1)

## 2019-04-23 LAB — T4, FREE: Free T4: 1.4 ng/dL (ref 0.8–1.8)

## 2019-04-23 LAB — VITAMIN D 25 HYDROXY (VIT D DEFICIENCY, FRACTURES): Vit D, 25-Hydroxy: 108 ng/mL — ABNORMAL HIGH (ref 30–100)

## 2019-04-23 LAB — TSH: TSH: 2.2 mIU/L

## 2019-04-25 ENCOUNTER — Telehealth: Payer: Self-pay

## 2019-04-25 ENCOUNTER — Telehealth: Payer: Self-pay | Admitting: Internal Medicine

## 2019-04-25 DIAGNOSIS — Z20822 Contact with and (suspected) exposure to covid-19: Secondary | ICD-10-CM

## 2019-04-25 NOTE — Telephone Encounter (Signed)
Called [facility] to have them scheduled COVID testing and also set up virtual visit.

## 2019-04-25 NOTE — Telephone Encounter (Signed)
Schedule Covid 19 testing and virtual visit

## 2019-04-25 NOTE — Telephone Encounter (Signed)
Ashley Hammond [phone]  [name] called to say she seen her lab results on Mychart, so does she still need to come in for her office visit, however while talking with her she stated that she has loss of smell, congestion and productive cough.

## 2019-04-25 NOTE — Telephone Encounter (Signed)
Ashley Hammond, Emergency planning/management officer called to refer the patient from covid testing for Dr. Cresenciano Lick. [name]. I called the patient and advised of the request for testing, she verbalized understanding. Appointment scheduled for tomorrow at 52 at [facility], advised of location and to wear a mask for everyone in the vehicle, she verbalized understanding. Order placed.

## 2019-04-26 ENCOUNTER — Ambulatory Visit (INDEPENDENT_AMBULATORY_CARE_PROVIDER_SITE_OTHER): Payer: 59 | Admitting: Internal Medicine

## 2019-04-26 ENCOUNTER — Other Ambulatory Visit: Payer: 59

## 2019-04-26 ENCOUNTER — Other Ambulatory Visit: Payer: Self-pay

## 2019-04-26 DIAGNOSIS — U071 COVID-19: Secondary | ICD-10-CM | POA: Diagnosis not present

## 2019-04-26 DIAGNOSIS — E7800 Pure hypercholesterolemia, unspecified: Secondary | ICD-10-CM

## 2019-04-26 DIAGNOSIS — Z20822 Contact with and (suspected) exposure to covid-19: Secondary | ICD-10-CM

## 2019-04-26 DIAGNOSIS — E039 Hypothyroidism, unspecified: Secondary | ICD-10-CM

## 2019-04-26 DIAGNOSIS — F329 Major depressive disorder, single episode, unspecified: Secondary | ICD-10-CM

## 2019-04-26 DIAGNOSIS — R432 Parageusia: Secondary | ICD-10-CM | POA: Diagnosis not present

## 2019-04-26 DIAGNOSIS — F419 Anxiety disorder, unspecified: Secondary | ICD-10-CM | POA: Diagnosis not present

## 2019-04-26 DIAGNOSIS — J22 Unspecified acute lower respiratory infection: Secondary | ICD-10-CM

## 2019-04-26 DIAGNOSIS — R748 Abnormal levels of other serum enzymes: Secondary | ICD-10-CM

## 2019-04-26 DIAGNOSIS — Z20828 Contact with and (suspected) exposure to other viral communicable diseases: Secondary | ICD-10-CM

## 2019-04-26 MED ORDER — BENZONATATE 100 MG PO CAPS: 100.0000 mg | ORAL_CAPSULE | Freq: Three times a day (TID) | ORAL | 1 refills | Status: DC | PRN

## 2019-04-26 NOTE — Progress Notes (Signed)
   Subjective:    Patient ID: Ashley Hammond, female    DOB: [birth year], 53 y.o.   MRN: [id number]  HPI 2 Female seen by interactive audio and video telecommunications due to the coronavirus pandemic.  She is agreeable to visit in this format today.  She is identified using 2 identifiers as Ashley Hammond, a patient in this practice.  This format was chosen because patient called to say she had cough and dysgeusia.  She was originally scheduled to come in to discuss hypothyroidism, elevated vitamin D level, and elevated liver functions.  However due to concern for COVID-19 symptoms she was seen virtually.  She is agreeable to visit in this format.  Patient was here on June 26 and was not having any symptoms of COVID-19.  Apparently her daughter came to visit her from [place] and subsequently went on to a party at an apartment in [place].  Daughter subsequently went back to [place] and was tested and was found to be positive for COVID-19.  Patient now complaining of mild symptoms of dysgeusia and cough.  She did go into work for a short period of time and was in a business office with one other individual.  I recommended that she contact [place] for advice regarding her work situation and exposure of employees.  She is going to need to self quarantine for 10 days if positive.  With regard to vitamin D level, she decreased her dosage of oral supplementation and level went from 111 to 108.  We will recheck this in a few months.  With regard to liver functions being elevated, it was likely due to a beach trip with heavy alcohol consumption.  Liver functions are now normal.  With regard to elevated TSH 1 month ago at 4.56.  She was started on thyroid replacement and TSH is now normal.      Review of Systems complains of dysgeusia and cough     Objective:   Physical Exam Seen virtually in no acute distress.  Able to give a clear concise history.  Not  heard to be coughing.  Addendum: May 02, 2019- COVID-19 test is positive.  Patient informed.  Still remains basically asymptomatic.     Assessment & Plan:  COVID-19 exposure  COVID-19 positivity  Hypothyroidism-stable  Elevated vitamin D level-continue with lower dose of vitamin D supplement as this has improved over the past few weeks.  Elevated liver functions-now normal with decreasing alcohol consumption.  Dysgeusia-this is a symptom of COVID-19.  Plan: Patient will notify me if symptoms worsen.  Otherwise should follow-up here in 6 months for recheck on hypothyroidism.  She will take Zithromax Z-PAK for cough and will need to self quarantine for 10 days if COVID positive.  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  35 minutes spent in virtual visit, time spent arranging COVID-19 testing, time spent delivering results to patient and offering advice

## 2019-04-26 NOTE — Patient Instructions (Addendum)
COVID-19 testing arranged.  Zithromax Z-PAK take 2 p.o. day 1 followed by 1 p.o. days 2 through 5.  Patient will need to self quarantine for 10 days.  Advised her to contact health department for advice regarding possible employee exposure recently.

## 2019-04-27 ENCOUNTER — Telehealth: Payer: Self-pay

## 2019-04-27 MED ORDER — BUPROPION HCL ER (XL) 300 MG PO TB24: ORAL_TABLET | ORAL | 11 refills | Status: DC

## 2019-05-02 ENCOUNTER — Ambulatory Visit (INDEPENDENT_AMBULATORY_CARE_PROVIDER_SITE_OTHER): Payer: 59 | Admitting: Internal Medicine

## 2019-05-02 ENCOUNTER — Other Ambulatory Visit: Payer: Self-pay

## 2019-05-02 ENCOUNTER — Telehealth: Payer: Self-pay

## 2019-05-02 DIAGNOSIS — U071 COVID-19: Secondary | ICD-10-CM | POA: Diagnosis not present

## 2019-05-02 LAB — NOVEL CORONAVIRUS, NAA: SARS-CoV-2, NAA: DETECTED — AB

## 2019-05-02 NOTE — Telephone Encounter (Signed)
Set up virtual visit 

## 2019-05-02 NOTE — Telephone Encounter (Signed)
COVID is test positive per call center [place].

## 2019-05-02 NOTE — Telephone Encounter (Signed)
Patient needs to be verified.

## 2019-05-14 ENCOUNTER — Encounter: Payer: Self-pay | Admitting: Internal Medicine

## 2019-05-15 ENCOUNTER — Encounter: Payer: Self-pay | Admitting: Internal Medicine

## 2019-05-15 NOTE — Progress Notes (Signed)
   Subjective:    Patient ID: Ashley Hammond, female    DOB: Feb 10, 1966, 53 y.o.   MRN: [id number]  HPI 53 year old Female seen by interactive audio and video telecommunications due to the Coronavirus pandemic.  She is identified using 2 identifiers as Ashley Hammond, a patient in this practice.  She is agreeable to visit in this format today.  She was notified her COVID-19 test is positive.  She basically is asymptomatic.  She may have a slight headache but no fever, chills, nausea vomiting.  Has had dysgeusia.  Was placed on Zithromax June 30 because she had a slight cough.  She has been quarantining at home.  Her daughter apparently found out that she was COVID-19 positive when she returned to [place] from a visit here.  Daughter had attended a party at an apartment in [place]  while she was visiting here.      Review of Systems new complaints     Objective:   Physical Exam Reports she is afebrile.  Seen on video in no acute distress.       Assessment & Plan:  COVID-19 positive with minimal symptoms  Plan: Recommend 10-day quarantine from date of test.  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  She will call if symptoms worsen.

## 2019-05-15 NOTE — Patient Instructions (Signed)
Finish 10-day quarantine.  Call if any concerns.

## 2019-05-18 ENCOUNTER — Ambulatory Visit: Payer: 59

## 2019-05-23 ENCOUNTER — Other Ambulatory Visit: Payer: Self-pay | Admitting: Internal Medicine

## 2019-05-23 MED ORDER — FLUOXETINE HCL 10 MG PO CAPS: ORAL_CAPSULE | ORAL | 1 refills | Status: DC

## 2019-05-23 NOTE — Telephone Encounter (Signed)
Received Fax RX request from  [facility]  Medication - FLUoxetine (PROZAC) 10 MG capsule   Last Refill - 02-19-19  Last OV - 05-02-19  Last CPE - 03-24-19

## 2019-05-25 ENCOUNTER — Ambulatory Visit: Payer: 59 | Admitting: Obstetrics and Gynecology

## 2019-06-09 ENCOUNTER — Ambulatory Visit (INDEPENDENT_AMBULATORY_CARE_PROVIDER_SITE_OTHER): Payer: 59 | Admitting: Internal Medicine

## 2019-06-09 ENCOUNTER — Encounter: Payer: Self-pay | Admitting: Internal Medicine

## 2019-06-09 ENCOUNTER — Other Ambulatory Visit: Payer: 59 | Admitting: Internal Medicine

## 2019-06-09 ENCOUNTER — Other Ambulatory Visit: Payer: Self-pay

## 2019-06-09 VITALS — BP 120/80 | HR 91 | Temp 98.2°F | Ht 69.0 in | Wt 173.0 lb

## 2019-06-09 DIAGNOSIS — U071 COVID-19: Secondary | ICD-10-CM

## 2019-06-10 LAB — SAR COV2 SEROLOGY (COVID19)AB(IGG),IA: SARS CoV2 AB IGG: POSITIVE — AB

## 2019-06-10 NOTE — Progress Notes (Signed)
   Subjective:    Patient ID: Ashley Hammond, female    DOB: 1966/01/04, 53 y.o.   MRN: [id number]  HPI 53 year old Female exposed to Covid -43 by her daughter who tested positive in late June.  Patient was tested June 30,2020.  This was a PCR test done by [facility] and proved to be positive.  She was complaining of call.  Patient says she recovered within a short period of time and has not had any further symptoms.  Today she wants to be tested for COVID 19 antibody because she might consider donating plasma if it would be of some help to another patient.    Review of Systems no new complaints     Objective:   Physical Exam  Not examined spent 10 minutes speaking with her about this issue      Assessment & Plan:  COVID-19 exposure with positive PCR test.  Now wants COVID-19 antibody drawn.  This will be done in office today  Addendum: June 10, 2019.  Patient has COVID-19 IgG antibody.  Message left on voicemail.

## 2019-06-11 NOTE — Patient Instructions (Signed)
COVID-19 antibody testing performed and is positive

## 2019-06-27 ENCOUNTER — Other Ambulatory Visit: Payer: Self-pay

## 2019-06-27 MED ORDER — LEVOTHYROXINE SODIUM 50 MCG PO TABS: 50.0000 ug | ORAL_TABLET | Freq: Every day | ORAL | 1 refills | Status: DC

## 2019-06-29 ENCOUNTER — Other Ambulatory Visit: Payer: Self-pay

## 2019-06-29 ENCOUNTER — Ambulatory Visit
Admission: RE | Admit: 2019-06-29 | Discharge: 2019-06-29 | Disposition: A | Discharge status: Home / Self Care | Payer: 59 | Source: Ambulatory Visit | Attending: Internal Medicine | Admitting: Internal Medicine

## 2019-06-30 ENCOUNTER — Other Ambulatory Visit: Payer: Self-pay

## 2019-06-30 ENCOUNTER — Other Ambulatory Visit (HOSPITAL_COMMUNITY)
Admission: RE | Admit: 2019-06-30 | Discharge: 2019-06-30 | Disposition: A | Discharge status: Home / Self Care | Payer: 59 | Source: Ambulatory Visit | Attending: Obstetrics and Gynecology | Admitting: Obstetrics and Gynecology

## 2019-06-30 ENCOUNTER — Encounter: Payer: Self-pay | Admitting: Obstetrics and Gynecology

## 2019-06-30 ENCOUNTER — Ambulatory Visit (INDEPENDENT_AMBULATORY_CARE_PROVIDER_SITE_OTHER): Payer: 59 | Admitting: Obstetrics and Gynecology

## 2019-06-30 VITALS — BP 112/74 | HR 84 | Temp 97.2°F | Ht 69.0 in | Wt 174.0 lb

## 2019-06-30 DIAGNOSIS — Z01419 Encounter for gynecological examination (general) (routine) without abnormal findings: Secondary | ICD-10-CM

## 2019-06-30 DIAGNOSIS — Z30432 Encounter for removal of intrauterine contraceptive device: Secondary | ICD-10-CM

## 2019-06-30 DIAGNOSIS — Z1211 Encounter for screening for malignant neoplasm of colon: Secondary | ICD-10-CM | POA: Diagnosis not present

## 2019-06-30 DIAGNOSIS — N912 Amenorrhea, unspecified: Secondary | ICD-10-CM | POA: Diagnosis not present

## 2019-06-30 DIAGNOSIS — Z124 Encounter for screening for malignant neoplasm of cervix: Secondary | ICD-10-CM | POA: Diagnosis not present

## 2019-06-30 MED ORDER — MEDROXYPROGESTERONE ACETATE 5 MG PO TABS: ORAL_TABLET | ORAL | 0 refills | Status: DC

## 2019-06-30 NOTE — Progress Notes (Signed)
53 y.o. VS:[id number] Single White or Caucasian Not Hispanic or Latino female here for annual exam.  She has a mirena that is overdue for removal.  She has had the mirena for 7 years. She had an elevated FSH ~3-4 years ago. Not sexually active. She has only had mild vasomotor symptoms ever.      No LMP recorded. Patient is postmenopausal.          Sexually active: No.  The current method of family planning is post menopausal status,  Mirena IUD 53 years old Exercising: No.  The patient does not participate in regular exercise at present. Smoker:  no  Health Maintenance: Pap:  2017 WNL per patient History of abnormal Pap:  Yes, 25 years ago follow up was normal MMG:  06/29/2019 Birads 1 negative Colonoscopy: Never TDaP:  Unsure, declines today Gardasil: N/A   reports that she has never smoked. She has never used smokeless tobacco. She reports current alcohol use of about 10.0 standard drinks of alcohol per week. She reports that she does not use drugs. Kids are 24 and 25. She owns all pets consider.   History reviewed. No pertinent past medical history.  H/o depression/anxiety well controlled   Past Surgical History:  Procedure Laterality Date  . ANKLE SURGERY      Current Outpatient Medications  Medication Sig Dispense Refill  . ALPRAZolam (XANAX) 0.5 MG tablet Take 1 tablet (0.5 mg total) by mouth at bedtime as needed for anxiety. 60 tablet 1  . buPROPion (WELLBUTRIN XL) 300 MG 24 hr tablet TAKE 1 TABLET(300 MG) BY MOUTH DAILY 30 tablet 11  . FLUoxetine (PROZAC) 10 MG capsule TAKE 1 CAPSULE(10 MG) BY MOUTH DAILY 90 capsule 1  . levonorgestrel (MIRENA) 20 MCG/24HR IUD 1 each by Intrauterine route once.    Marland Kitchen levothyroxine (SYNTHROID) 50 MCG tablet Take 1 tablet (50 mcg total) by mouth daily. Take it on an empty stomach 30 minutes before breakfast. 90 tablet 1  . magnesium 30 MG tablet Take 30 mg by mouth 2 (two) times daily.    . pseudoephedrine (SUDAFED) 30 MG tablet Take 30 mg by mouth  every 4 (four) hours as needed for congestion.    Marland Kitchen zolpidem (AMBIEN) 10 MG tablet TK 1 T PO QHS PRN 30 tablet 2   No current facility-administered medications for this visit.     Family History  Problem Relation Age of Onset  . Diabetes Brother   . Heart attack Father   . Sudden death Neg Hx   . Hyperlipidemia Neg Hx   . Hypertension Neg Hx   . Breast cancer Neg Hx     Review of Systems  Constitutional: Negative.   HENT: Negative.   Eyes: Negative.   Respiratory: Negative.   Cardiovascular: Negative.   Gastrointestinal: Negative.   Endocrine: Negative.   Genitourinary: Negative.   Musculoskeletal: Negative.   Skin: Negative.   Allergic/Immunologic: Negative.   Neurological: Negative.   Hematological: Negative.   Psychiatric/Behavioral: Negative.     Exam:   BP 112/74 (BP Location: Right Arm, Patient Position: Sitting, Cuff Size: Large)   Pulse 84   Temp (!) 97.2 F (36.2 C) (Skin)   Ht 5\' 9"  (1.753 m)   Wt 174 lb (78.9 kg)   BMI 25.70 kg/m   Weight change: @WEIGHTCHANGE @ Height:   Height: 5\' 9"  (175.3 cm)  Ht Readings from Last 3 Encounters:  06/30/19 5\' 9"  (1.753 m)  06/09/19 5\' 9"  (1.753 m)  03/24/19 5\' 9"  (  1.753 m)    General appearance: alert, cooperative and appears stated age Head: Normocephalic, without obvious abnormality, atraumatic Neck: no adenopathy, supple, symmetrical, trachea midline and thyroid normal to inspection and palpation Lungs: clear to auscultation bilaterally Cardiovascular: regular rate and rhythm Breasts: normal appearance, no masses or tenderness Abdomen: soft, non-tender; non distended,  no masses,  no organomegaly Extremities: extremities normal, atraumatic, no cyanosis or edema Skin: Skin color, texture, turgor normal. No rashes or lesions Lymph nodes: Cervical, supraclavicular, and axillary nodes normal. No abnormal inguinal nodes palpated Neurologic: Grossly normal   Pelvic: External genitalia:  no lesions               Urethra:  normal appearing urethra with no masses, tenderness or lesions              Bartholins and Skenes: normal                 Vagina: very mildly atrophic appearing vagina with normal color and discharge, no lesions              Cervix: no lesions and IUD strings 4 cm. IUD removed with ringed forceps               Bimanual Exam:  Uterus:  normal size, contour, position, consistency, mobility, non-tender              Adnexa: no mass, fullness, tenderness               Rectovaginal: Confirms               Anus:  normal sphincter tone, no lesions  Chaperone was present for exam.  A:  Well Woman with normal exam  IUD removal  Peri vs postmenopausal    P:   Pap with hpv  Mammogram just done  Colonoscopy referral placed  Discussed breast self exam  Discussed calcium and vit D intake  FSH  Provera x 5 days in 11/20, call with or without bleeding

## 2019-06-30 NOTE — Patient Instructions (Addendum)
EXERCISE AND DIET:  We recommended that you start or continue a regular exercise program for good health. Regular exercise means any activity that makes your heart beat faster and makes you sweat.  We recommend exercising at least 30 minutes per day at least 3 days a week, preferably 4 or 5.  We also recommend a diet low in fat and sugar.  Inactivity, poor dietary choices and obesity can cause diabetes, heart attack, stroke, and kidney damage, among others.    ALCOHOL AND SMOKING:  Women should limit their alcohol intake to no more than 7 drinks/beers/glasses of wine (combined, not each!) per week. Moderation of alcohol intake to this level decreases your risk of breast cancer and liver damage. And of course, no recreational drugs are part of a healthy lifestyle.  And absolutely no smoking or even second hand smoke. Most people know smoking can cause heart and lung diseases, but did you know it also contributes to weakening of your bones? Aging of your skin?  Yellowing of your teeth and nails?  CALCIUM AND VITAMIN D:  Adequate intake of calcium and Vitamin D are recommended.  The recommendations for exact amounts of these supplements seem to change often, but generally speaking 1,200 mg of calcium (between diet and supplement) and 800 units of Vitamin D per day seems prudent. Certain women may benefit from higher intake of Vitamin D.  If you are among these women, your doctor will have told you during your visit.    PAP SMEARS:  Pap smears, to check for cervical cancer or precancers,  have traditionally been done yearly, although recent scientific advances have shown that most women can have pap smears less often.  However, every woman still should have a physical exam from her gynecologist every year. It will include a breast check, inspection of the vulva and vagina to check for abnormal growths or skin changes, a visual exam of the cervix, and then an exam to evaluate the size and shape of the uterus and  ovaries.  And after 55 years of age, a rectal exam is indicated to check for rectal cancers. We will also provide age appropriate advice regarding health maintenance, like when you should have certain vaccines, screening for sexually transmitted diseases, bone density testing, colonoscopy, mammograms, etc.   MAMMOGRAMS:  All women over 40 years old should have a yearly mammogram. Many facilities now offer a "3D" mammogram, which may cost around $50 extra out of pocket. If possible,  we recommend you accept the option to have the 3D mammogram performed.  It both reduces the number of women who will be called back for extra views which then turn out to be normal, and it is better than the routine mammogram at detecting truly abnormal areas.    COLON CANCER SCREENING: Now recommend starting at age 45. At this time colonoscopy is not covered for routine screening until 50. There are take home tests that can be done between 45-49.   COLONOSCOPY:  Colonoscopy to screen for colon cancer is recommended for all women at age 50.  We know, you hate the idea of the prep.  We agree, BUT, having colon cancer and not knowing it is worse!!  Colon cancer so often starts as a polyp that can be seen and removed at colonscopy, which can quite literally save your life!  And if your first colonoscopy is normal and you have no family history of colon cancer, most women don't have to have it again for   10 years.  Once every ten years, you can do something that may end up saving your life, right?  We will be happy to help you get it scheduled when you are ready.  Be sure to check your insurance coverage so you understand how much it will cost.  It may be covered as a preventative service at no cost, but you should check your particular policy.      Breast Self-Awareness Breast self-awareness means being familiar with how your breasts look and feel. It involves checking your breasts regularly and reporting any changes to your  health care provider. Practicing breast self-awareness is important. A change in your breasts can be a sign of a serious medical problem. Being familiar with how your breasts look and feel allows you to find any problems early, when treatment is more likely to be successful. All women should practice breast self-awareness, including women who have had breast implants. How to do a breast self-exam One way to learn what is normal for your breasts and whether your breasts are changing is to do a breast self-exam. To do a breast self-exam: Look for Changes  1. Remove all the clothing above your waist. 2. Stand in front of a mirror in a room with good lighting. 3. Put your hands on your hips. 4. Push your hands firmly downward. 5. Compare your breasts in the mirror. Look for differences between them (asymmetry), such as: ? Differences in shape. ? Differences in size. ? Puckers, dips, and bumps in one breast and not the other. 6. Look at each breast for changes in your skin, such as: ? Redness. ? Scaly areas. 7. Look for changes in your nipples, such as: ? Discharge. ? Bleeding. ? Dimpling. ? Redness. ? A change in position. Feel for Changes Carefully feel your breasts for lumps and changes. It is best to do this while lying on your back on the floor and again while sitting or standing in the shower or tub with soapy water on your skin. Feel each breast in the following way:  Place the arm on the side of the breast you are examining above your head.  Feel your breast with the other hand.  Start in the nipple area and make  inch (2 cm) overlapping circles to feel your breast. Use the pads of your three middle fingers to do this. Apply light pressure, then medium pressure, then firm pressure. The light pressure will allow you to feel the tissue closest to the skin. The medium pressure will allow you to feel the tissue that is a little deeper. The firm pressure will allow you to feel the tissue  close to the ribs.  Continue the overlapping circles, moving downward over the breast until you feel your ribs below your breast.  Move one finger-width toward the center of the body. Continue to use the  inch (2 cm) overlapping circles to feel your breast as you move slowly up toward your collarbone.  Continue the up and down exam using all three pressures until you reach your armpit.  Write Down What You Find  Write down what is normal for each breast and any changes that you find. Keep a written record with breast changes or normal findings for each breast. By writing this information down, you do not need to depend only on memory for size, tenderness, or location. Write down where you are in your menstrual cycle, if you are still menstruating. If you are having trouble noticing differences   in your breasts, do not get discouraged. With time you will become more familiar with the variations in your breasts and more comfortable with the exam. How often should I examine my breasts? Examine your breasts every month. If you are breastfeeding, the best time to examine your breasts is after a feeding or after using a breast pump. If you menstruate, the best time to examine your breasts is 5-7 days after your period is over. During your period, your breasts are lumpier, and it may be more difficult to notice changes. When should I see my health care provider? See your health care provider if you notice:  A change in shape or size of your breasts or nipples.  A change in the skin of your breast or nipples, such as a reddened or scaly area.  Unusual discharge from your nipples.  A lump or thick area that was not there before.  Pain in your breasts.  Anything that concerns you.   Menopause Menopause is the normal time of life when menstrual periods stop completely. It is usually confirmed by 12 months without a menstrual period. The transition to menopause (perimenopause) most often happens  between the ages of 45 and 55. During perimenopause, hormone levels change in your body, which can cause symptoms and affect your health. Menopause may increase your risk for:  Loss of bone (osteoporosis), which causes bone breaks (fractures).  Depression.  Hardening and narrowing of the arteries (atherosclerosis), which can cause heart attacks and strokes. What are the causes? This condition is usually caused by a natural change in hormone levels that happens as you get older. The condition may also be caused by surgery to remove both ovaries (bilateral oophorectomy). What increases the risk? This condition is more likely to start at an earlier age if you have certain medical conditions or treatments, including:  A tumor of the pituitary gland in the brain.  A disease that affects the ovaries and hormone production.  Radiation treatment for cancer.  Certain cancer treatments, such as chemotherapy or hormone (anti-estrogen) therapy.  Heavy smoking and excessive alcohol use.  Family history of early menopause. This condition is also more likely to develop earlier in women who are very thin. What are the signs or symptoms? Symptoms of this condition include:  Hot flashes.  Irregular menstrual periods.  Night sweats.  Changes in feelings about sex. This could be a decrease in sex drive or an increased comfort around your sexuality.  Vaginal dryness and thinning of the vaginal walls. This may cause painful intercourse.  Dryness of the skin and development of wrinkles.  Headaches.  Problems sleeping (insomnia).  Mood swings or irritability.  Memory problems.  Weight gain.  Hair growth on the face and chest.  Bladder infections or problems with urinating. How is this diagnosed? This condition is diagnosed based on your medical history, a physical exam, your age, your menstrual history, and your symptoms. Hormone tests may also be done. How is this treated? In some  cases, no treatment is needed. You and your health care provider should make a decision together about whether treatment is necessary. Treatment will be based on your individual condition and preferences. Treatment for this condition focuses on managing symptoms. Treatment may include:  Menopausal hormone therapy (MHT).  Medicines to treat specific symptoms or complications.  Acupuncture.  Vitamin or herbal supplements. Before starting treatment, make sure to let your health care provider know if you have a personal or family history of:  Heart   disease.  Breast cancer.  Blood clots.  Diabetes.  Osteoporosis. Follow these instructions at home: Lifestyle  Do not use any products that contain nicotine or tobacco, such as cigarettes and e-cigarettes. If you need help quitting, ask your health care provider.  Get at least 30 minutes of physical activity on 5 or more days each week.  Avoid alcoholic and caffeinated beverages, as well as spicy foods. This may help prevent hot flashes.  Get 7-8 hours of sleep each night.  If you have hot flashes, try: ? Dressing in layers. ? Avoiding things that may trigger hot flashes, such as spicy food, warm places, or stress. ? Taking slow, deep breaths when a hot flash starts. ? Keeping a fan in your home and office.  Find ways to manage stress, such as deep breathing, meditation, or journaling.  Consider going to group therapy with other women who are having menopause symptoms. Ask your health care provider about recommended group therapy meetings. Eating and drinking  Eat a healthy, balanced diet that contains whole grains, lean protein, low-fat dairy, and plenty of fruits and vegetables.  Your health care provider may recommend adding more soy to your diet. Foods that contain soy include tofu, tempeh, and soy milk.  Eat plenty of foods that contain calcium and vitamin D for bone health. Items that are rich in calcium include low-fat  milk, yogurt, beans, almonds, sardines, broccoli, and kale. Medicines  Take over-the-counter and prescription medicines only as told by your health care provider.  Talk with your health care provider before starting any herbal supplements. If prescribed, take vitamins and supplements as told by your health care provider. These may include: ? Calcium. Women age 51 and older should get 1,200 mg (milligrams) of calcium every day. ? Vitamin D. Women need 600-800 International Units of vitamin D each day. ? Vitamins B12 and B6. Aim for 50 micrograms of B12 and 1.5 mg of B6 each day. General instructions  Keep track of your menstrual periods, including: ? When they occur. ? How heavy they are and how long they last. ? How much time passes between periods.  Keep track of your symptoms, noting when they start, how often you have them, and how long they last.  Use vaginal lubricants or moisturizers to help with vaginal dryness and improve comfort during sex.  Keep all follow-up visits as told by your health care provider. This is important. This includes any group therapy or counseling. Contact a health care provider if:  You are still having menstrual periods after age 55.  You have pain during sex.  You have not had a period for 12 months and you develop vaginal bleeding. Get help right away if:  You have: ? Severe depression. ? Excessive vaginal bleeding. ? Pain when you urinate. ? A fast or irregular heart beat (palpitations). ? Severe headaches. ? Abdomen (abdominal) pain or severe indigestion.  You fell and you think you have a broken bone.  You develop leg or chest pain.  You develop vision problems.  You feel a lump in your breast. Summary  Menopause is the normal time of life when menstrual periods stop completely. It is usually confirmed by 12 months without a menstrual period.  The transition to menopause (perimenopause) most often happens between the ages of 45 and  55.  Symptoms can be managed through medicines, lifestyle changes, and complementary therapies such as acupuncture.  Eat a balanced diet that is rich in nutrients to promote bone health   and heart health and to manage symptoms during menopause. This information is not intended to replace advice given to you by your health care provider. Make sure you discuss any questions you have with your health care provider. Document Released: 01/03/2004 Document Revised: 09/25/2017 Document Reviewed: 11/15/2016 Elsevier Patient Education  2020 Elsevier Inc.  

## 2019-07-01 LAB — FOLLICLE STIMULATING HORMONE: FSH: 86.6 m[IU]/mL

## 2019-07-05 ENCOUNTER — Telehealth: Payer: Self-pay

## 2019-07-05 LAB — CYTOLOGY - PAP
Diagnosis: NEGATIVE
HPV 16/18/45 genotyping: NEGATIVE
HPV: DETECTED — AB

## 2019-07-05 NOTE — Telephone Encounter (Signed)
Left message to call Kaitlyn at [phone]. 

## 2019-07-05 NOTE — Telephone Encounter (Signed)
-----   Message from Salvadore Dom, MD sent at 07/05/2019  2:02 PM EDT ----- Please add on HPV 16/18/45 and let the patient know

## 2019-07-19 NOTE — Telephone Encounter (Signed)
Notes recorded by Gwendlyn Deutscher, RN on 07/18/2019 at 8:33 AM EDT  12 month recall entered.  ------   Notes recorded by Salvadore Dom, MD on 07/18/2019 at 8:02 AM EDT  12 recall  Results and any recommendations were sent via [place].    Hi Adelisa,  Your hpv subtyping is negative for the more aggressive forms of HPV. You should have another pap next year. Please call the office with any questions.  Sumner Boast

## 2019-07-25 ENCOUNTER — Telehealth: Payer: Self-pay | Admitting: Internal Medicine

## 2019-07-25 NOTE — Telephone Encounter (Signed)
Pt called and has questions about her levothyroxine (SYNTHROID) 50 MCG tablet prescription, she was told by the pharmacy to call us

## 2019-08-19 ENCOUNTER — Other Ambulatory Visit: Payer: 59 | Admitting: Internal Medicine

## 2019-08-23 ENCOUNTER — Ambulatory Visit: Payer: 59 | Admitting: Internal Medicine

## 2019-08-29 ENCOUNTER — Encounter: Payer: Self-pay | Admitting: Obstetrics and Gynecology

## 2019-09-09 ENCOUNTER — Other Ambulatory Visit: Payer: 59 | Admitting: Internal Medicine

## 2019-09-13 ENCOUNTER — Ambulatory Visit: Payer: 59 | Admitting: Internal Medicine

## 2019-10-27 NOTE — Progress Notes (Unsigned)
No show for labs in November. No follow up on book. Will have staff call her.

## 2019-10-31 ENCOUNTER — Telehealth: Payer: Self-pay | Admitting: Internal Medicine

## 2019-10-31 NOTE — Telephone Encounter (Signed)
LVM to CB and schedule 6 month follow up with labs prior, lipid, liver, TSH

## 2019-11-03 NOTE — Telephone Encounter (Signed)
Patient called back to schedule

## 2019-11-10 ENCOUNTER — Other Ambulatory Visit: Payer: Self-pay

## 2019-11-10 ENCOUNTER — Other Ambulatory Visit: Payer: 59 | Admitting: Internal Medicine

## 2019-11-10 DIAGNOSIS — E039 Hypothyroidism, unspecified: Secondary | ICD-10-CM

## 2019-11-10 DIAGNOSIS — E559 Vitamin D deficiency, unspecified: Secondary | ICD-10-CM

## 2019-11-10 DIAGNOSIS — E7800 Pure hypercholesterolemia, unspecified: Secondary | ICD-10-CM

## 2019-11-10 LAB — HEPATIC FUNCTION PANEL
AG Ratio: 1.8 (calc) (ref 1.0–2.5)
ALT: 45 U/L — ABNORMAL HIGH (ref 6–29)
AST: 36 U/L — ABNORMAL HIGH (ref 10–35)
Albumin: 4.3 g/dL (ref 3.6–5.1)
Alkaline phosphatase (APISO): 73 U/L (ref 37–153)
Bilirubin, Direct: 0.1 mg/dL (ref 0.0–0.2)
Globulin: 2.4 g/dL (calc) (ref 1.9–3.7)
Indirect Bilirubin: 0.4 mg/dL (calc) (ref 0.2–1.2)
Total Bilirubin: 0.5 mg/dL (ref 0.2–1.2)
Total Protein: 6.7 g/dL (ref 6.1–8.1)

## 2019-11-10 LAB — VITAMIN D 25 HYDROXY (VIT D DEFICIENCY, FRACTURES): Vit D, 25-Hydroxy: 44 ng/mL (ref 30–100)

## 2019-11-10 LAB — LIPID PANEL
Cholesterol: 270 mg/dL — ABNORMAL HIGH (ref <200)
HDL: 113 mg/dL (ref >=50)
LDL Cholesterol (Calc): 140 mg/dL (calc) — ABNORMAL HIGH
Non-HDL Cholesterol (Calc): 157 mg/dL (calc) — ABNORMAL HIGH (ref <130)
Total CHOL/HDL Ratio: 2.4 (calc) (ref <5.0)
Triglycerides: 77 mg/dL (ref <150)

## 2019-11-10 LAB — TSH: TSH: 3.03 mIU/L

## 2019-11-15 ENCOUNTER — Other Ambulatory Visit: Payer: Self-pay

## 2019-11-15 ENCOUNTER — Encounter: Payer: Self-pay | Admitting: Internal Medicine

## 2019-11-15 ENCOUNTER — Ambulatory Visit (INDEPENDENT_AMBULATORY_CARE_PROVIDER_SITE_OTHER): Payer: 59 | Admitting: Internal Medicine

## 2019-11-15 VITALS — BP 150/90 | HR 90 | Temp 98.0°F | Ht 69.0 in | Wt 175.0 lb

## 2019-11-15 DIAGNOSIS — E039 Hypothyroidism, unspecified: Secondary | ICD-10-CM

## 2019-11-15 DIAGNOSIS — F5101 Primary insomnia: Secondary | ICD-10-CM | POA: Diagnosis not present

## 2019-11-15 DIAGNOSIS — R7989 Other specified abnormal findings of blood chemistry: Secondary | ICD-10-CM

## 2019-11-15 DIAGNOSIS — Z0184 Encounter for antibody response examination: Secondary | ICD-10-CM

## 2019-11-15 DIAGNOSIS — Z8639 Personal history of other endocrine, nutritional and metabolic disease: Secondary | ICD-10-CM

## 2019-11-15 DIAGNOSIS — R768 Other specified abnormal immunological findings in serum: Secondary | ICD-10-CM

## 2019-11-15 DIAGNOSIS — E7800 Pure hypercholesterolemia, unspecified: Secondary | ICD-10-CM

## 2019-11-15 MED ORDER — BENZONATATE 100 MG PO CAPS: 100.0000 mg | ORAL_CAPSULE | Freq: Three times a day (TID) | ORAL | 0 refills | Status: DC

## 2019-11-15 MED ORDER — ZOLPIDEM TARTRATE 10 MG PO TABS: ORAL_TABLET | ORAL | 2 refills | Status: DC

## 2019-11-15 MED ORDER — ROSUVASTATIN CALCIUM 5 MG PO TABS: ORAL_TABLET | ORAL | 1 refills | Status: DC

## 2019-11-15 NOTE — Patient Instructions (Signed)
Begin Crestor 5 mg 3 times a week. Follow up at time of CPE [place] [address]. Ambien refilled. Watch diet, alcohol intake, and try to exercise more.

## 2019-11-28 ENCOUNTER — Telehealth: Payer: Self-pay | Admitting: Internal Medicine

## 2019-11-28 MED ORDER — LEVOTHYROXINE SODIUM 50 MCG PO TABS: 50.0000 ug | ORAL_TABLET | Freq: Every day | ORAL | 1 refills | Status: DC

## 2019-11-28 NOTE — Telephone Encounter (Signed)
Ashley Hammond [phone]  Ashley Hammond called to say that the pharmacy told her medicine was denied and she needed to contact PCP for below medication.  levothyroxine (SYNTHROID) 50 MCG tablet  [id number] - [place], [place] - [address] Phone:  [phone]  Fax:  [phone]

## 2019-11-29 ENCOUNTER — Other Ambulatory Visit: Payer: Self-pay

## 2019-11-29 MED ORDER — FLUOXETINE HCL 10 MG PO CAPS: ORAL_CAPSULE | ORAL | 1 refills | Status: DC

## 2019-12-20 NOTE — Progress Notes (Signed)
   Subjective:    Patient ID: Ashley Hammond, female    DOB: [birth year], 54 y.o.   MRN: [id number]  HPI 54 year old Female in today for 37-month follow-up.  History of depression, insomnia and vitamin D deficiency.  History of hyperlipidemia and hypothyroidism.  Tested positive for COVID-19 in June.  Had mild illness.  Has recovered without issues.  Has positive Covid antibody.  History of elevated liver enzymes.    Review of Systems     Objective:   Physical Exam Blood pressure 150/90, BMI 25.84, pulse 90, pulse oximetry 99% temperature 98 degrees orally weight 175 pounds  Skin warm and dry.  Nodes none.  Neck is supple.  No thyromegaly.  Chest clear to auscultation.  Cardiac exam regular rate and rhythm normal S1 and S2.  Extremities without edema.  Affect is normal.       Assessment & Plan:  History of vitamin D deficiency-level is 44.  Continue over-the-counter supplement.  History of elevated liver functions-these are only mildly elevated.  SGOT is 36 and SGPT 45.  Previously were much higher if 9 months ago but she has cut back on alcohol consumption.  Hyperlipidemia-LDL is 148 and total cholesterol is 270 with an HDL of 113.  She is being started on Crestor 5 mg 3 times a week with follow-up in June at time of physical examination.  Insomnia-takes Ambien at bedtime  Hypothyroidism-stable on levothyroxine 0.05 mg daily  History of COVID-19 infection and has antibody  Plan: Return June 2021 for health maintenance exam and fasting labs.  Continue current medications.  Watch diet and get exercise.  Begin Crestor 5 mg 3 times a week and follow-up at time of CPE.  Ambien refilled.

## 2020-01-16 ENCOUNTER — Encounter: Payer: Self-pay | Admitting: Certified Nurse Midwife

## 2020-04-12 ENCOUNTER — Other Ambulatory Visit: Payer: Self-pay

## 2020-04-12 ENCOUNTER — Other Ambulatory Visit: Payer: 59 | Admitting: Internal Medicine

## 2020-04-12 DIAGNOSIS — Z1321 Encounter for screening for nutritional disorder: Secondary | ICD-10-CM

## 2020-04-12 DIAGNOSIS — E039 Hypothyroidism, unspecified: Secondary | ICD-10-CM

## 2020-04-12 DIAGNOSIS — E7800 Pure hypercholesterolemia, unspecified: Secondary | ICD-10-CM

## 2020-04-12 DIAGNOSIS — F419 Anxiety disorder, unspecified: Secondary | ICD-10-CM

## 2020-04-12 DIAGNOSIS — Z0000 Encounter for general adult medical examination without abnormal findings: Secondary | ICD-10-CM

## 2020-04-13 ENCOUNTER — Ambulatory Visit (INDEPENDENT_AMBULATORY_CARE_PROVIDER_SITE_OTHER): Payer: 59 | Admitting: Internal Medicine

## 2020-04-13 ENCOUNTER — Encounter: Payer: Self-pay | Admitting: Internal Medicine

## 2020-04-13 ENCOUNTER — Other Ambulatory Visit: Payer: Self-pay

## 2020-04-13 VITALS — BP 120/80 | HR 78 | Ht 69.0 in | Wt 170.0 lb

## 2020-04-13 DIAGNOSIS — Z8616 Personal history of COVID-19: Secondary | ICD-10-CM | POA: Diagnosis not present

## 2020-04-13 DIAGNOSIS — Z0000 Encounter for general adult medical examination without abnormal findings: Secondary | ICD-10-CM | POA: Diagnosis not present

## 2020-04-13 DIAGNOSIS — F419 Anxiety disorder, unspecified: Secondary | ICD-10-CM

## 2020-04-13 DIAGNOSIS — E7800 Pure hypercholesterolemia, unspecified: Secondary | ICD-10-CM | POA: Diagnosis not present

## 2020-04-13 DIAGNOSIS — R7989 Other specified abnormal findings of blood chemistry: Secondary | ICD-10-CM

## 2020-04-13 DIAGNOSIS — F5101 Primary insomnia: Secondary | ICD-10-CM

## 2020-04-13 DIAGNOSIS — Z0184 Encounter for antibody response examination: Secondary | ICD-10-CM

## 2020-04-13 DIAGNOSIS — R748 Abnormal levels of other serum enzymes: Secondary | ICD-10-CM

## 2020-04-13 DIAGNOSIS — Z20822 Contact with and (suspected) exposure to covid-19: Secondary | ICD-10-CM

## 2020-04-13 DIAGNOSIS — Z8639 Personal history of other endocrine, nutritional and metabolic disease: Secondary | ICD-10-CM | POA: Diagnosis not present

## 2020-04-13 DIAGNOSIS — E039 Hypothyroidism, unspecified: Secondary | ICD-10-CM

## 2020-04-13 DIAGNOSIS — Z1211 Encounter for screening for malignant neoplasm of colon: Secondary | ICD-10-CM

## 2020-04-13 DIAGNOSIS — F329 Major depressive disorder, single episode, unspecified: Secondary | ICD-10-CM

## 2020-04-13 LAB — COMPLETE METABOLIC PANEL WITH GFR
AG Ratio: 1.9 (calc) (ref 1.0–2.5)
ALT: 23 U/L (ref 6–29)
AST: 21 U/L (ref 10–35)
Albumin: 4.6 g/dL (ref 3.6–5.1)
Alkaline phosphatase (APISO): 76 U/L (ref 37–153)
BUN: 9 mg/dL (ref 7–25)
CO2: 29 mmol/L (ref 20–32)
Calcium: 9.8 mg/dL (ref 8.6–10.4)
Chloride: 103 mmol/L (ref 98–110)
Creat: 0.7 mg/dL (ref 0.50–1.05)
GFR, Est African American: 113 mL/min/{1.73_m2} (ref >=60)
GFR, Est Non African American: 98 mL/min/{1.73_m2} (ref >=60)
Globulin: 2.4 g/dL (calc) (ref 1.9–3.7)
Glucose, Bld: 89 mg/dL (ref 65–99)
Potassium: 4.6 mmol/L (ref 3.5–5.3)
Sodium: 141 mmol/L (ref 135–146)
Total Bilirubin: 0.5 mg/dL (ref 0.2–1.2)
Total Protein: 7 g/dL (ref 6.1–8.1)

## 2020-04-13 LAB — CBC WITH DIFFERENTIAL/PLATELET
Absolute Monocytes: 533 cells/uL (ref 200–950)
Basophils Absolute: 50 cells/uL (ref 0–200)
Basophils Relative: 0.8 %
Eosinophils Absolute: 161 cells/uL (ref 15–500)
Eosinophils Relative: 2.6 %
HCT: 43.8 % (ref 35.0–45.0)
Hemoglobin: 15 g/dL (ref 11.7–15.5)
Lymphs Abs: 2065 cells/uL (ref 850–3900)
MCH: 33 pg (ref 27.0–33.0)
MCHC: 34.2 g/dL (ref 32.0–36.0)
MCV: 96.5 fL (ref 80.0–100.0)
MPV: 10.1 fL (ref 7.5–12.5)
Monocytes Relative: 8.6 %
Neutro Abs: 3391 cells/uL (ref 1500–7800)
Neutrophils Relative %: 54.7 %
Platelets: 286 10*3/uL (ref 140–400)
RBC: 4.54 10*6/uL (ref 3.80–5.10)
RDW: 11.8 % (ref 11.0–15.0)
Total Lymphocyte: 33.3 %
WBC: 6.2 10*3/uL (ref 3.8–10.8)

## 2020-04-13 LAB — TSH: TSH: 2.64 mIU/L

## 2020-04-13 LAB — LIPID PANEL
Cholesterol: 274 mg/dL — ABNORMAL HIGH (ref <200)
HDL: 104 mg/dL (ref >=50)
LDL Cholesterol (Calc): 148 mg/dL (calc) — ABNORMAL HIGH
Non-HDL Cholesterol (Calc): 170 mg/dL (calc) — ABNORMAL HIGH (ref <130)
Total CHOL/HDL Ratio: 2.6 (calc) (ref <5.0)
Triglycerides: 102 mg/dL (ref <150)

## 2020-04-13 NOTE — Patient Instructions (Addendum)
It was a pleasure to see you today.  Cholesterol remains elevated.  Follow-up in 6 months.  Medications refilled.  Continue same dose of thyroid medication.  Watch alcohol consumption.  Watch diet.  Exercise recommended.  COVID-19 antibody is negative.  You will need COVID-19 vaccine.

## 2020-04-16 ENCOUNTER — Telehealth: Payer: Self-pay | Admitting: Internal Medicine

## 2020-04-16 LAB — SARS-COV-2 ANTIBODY(IGG)SPIKE,SEMI-QUANTITATIVE: SARS COV1 AB(IGG)SPIKE,SEMI QN: <1 index (ref <1.00)

## 2020-04-16 MED ORDER — LEVOTHYROXINE SODIUM 50 MCG PO TABS: 50.0000 ug | ORAL_TABLET | Freq: Every day | ORAL | 0 refills | Status: DC

## 2020-04-16 NOTE — Telephone Encounter (Signed)
RX refilled, yes she said she got your message.

## 2020-04-16 NOTE — Telephone Encounter (Signed)
Ashley Hammond [phone]  [name] needs a 90 day supply sent to new pharmacy   New Pharmacy for these medications  [facility] [address], [place] Phone:  [phone]  Fax:  [phone]       levothyroxine (SYNTHROID) 50 MCG tablet 90 Day supply  zolpidem (AMBIEN) 10 MG tablet

## 2020-04-16 NOTE — Telephone Encounter (Signed)
Fill these for 90 days. Did she get my message? Has no Covid antibody and needs vaccine.

## 2020-04-22 ENCOUNTER — Encounter: Payer: Self-pay | Admitting: Internal Medicine

## 2020-04-22 MED ORDER — FLUOXETINE HCL 10 MG PO CAPS: ORAL_CAPSULE | ORAL | 3 refills | Status: DC

## 2020-04-22 MED ORDER — BUPROPION HCL ER (XL) 300 MG PO TB24: ORAL_TABLET | ORAL | 3 refills | Status: DC

## 2020-04-22 MED ORDER — ALPRAZOLAM 0.5 MG PO TABS: 0.5000 mg | ORAL_TABLET | Freq: Every evening | ORAL | 1 refills | Status: DC | PRN

## 2020-04-22 MED ORDER — ZOLPIDEM TARTRATE 10 MG PO TABS: ORAL_TABLET | ORAL | 5 refills | Status: DC

## 2020-04-22 NOTE — Progress Notes (Signed)
   Subjective:    Patient ID: Ashley Hammond, female    DOB: [birth year], 54 y.o.   MRN: [id number]  HPI 54 year old Female seen for health maintenance exam and evaluation of medical issues.  Tested positive for COVID-19 in June 2020.  She was seen here June 26 and was not having any symptoms of COVID-19.  Her daughter came to visit her from [place] and subsequently went on to a party and an apartment in [place].  Daughter subsequently tested positive for COVID-19 when she moved back to [place].  Patient was seen virtually here on June 30.  Was sent to test center and tested positive for COVID-19.  She would like to have antibody testing today.  Has not been vaccinated for COVID-19.  History of depression, insomnia and vitamin D deficiency.  No known drug allergies.  In January 2020 had right ankle lateral ligament reconstruction surgery at [facility] and has done well postoperatively.  History of hyperlipidemia.  Started on thyroid replacement in May 2020.  Had elevated TSH of 4.54.  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  History of smoking about 5 cigarettes daily.  Social alcohol consumption.  Family history: 1 brother with history of diabetes.  Another brother alive and well.    Review of Systems  Constitutional: Negative.   HENT: Negative.   Respiratory: Negative.   Cardiovascular: Negative.   Gastrointestinal: Negative.   Genitourinary: Negative.   Neurological: Negative.   Psychiatric/Behavioral:       History of anxiety depression and insomnia       Objective:   Physical Exam She is afebrile.  Blood pressure 120/80 pulse 78, pulse oximetry 98% weight 170 pounds BMI 25.10 height 5 feet 9 inches Skin warm and dry.  Nodes none.  TMs are clear.  Neck is supple without JVD thyromegaly or carotid bruits.  Chest clear to auscultation.  Breast without masses.  Cardiac exam regular rate and rhythm normal S1 and S2 without murmurs.   Abdomen soft nondistended without hepatosplenomegaly masses or tenderness.  No lower extremity edema.  Neuro intact without focal deficits.  Affect thought and judgment are normal.      Assessment & Plan:  History of vitamin D deficiency-vitamin D level is now 44 and stable on over-the-counter thyroid replacement  Insomnia treated with Ambien.  This has been refilled for 6 months.  Hypothyroidism-TSH normal on levothyroxine 50 mcg daily.  This can be refilled  Anxiety depression treated with Wellbutrin and Prozac  History of COVID-19 in June 2020.  Currently has not had COVID-19 vaccines.  She asked for antibody test and it proved to be negative so she will need to get vaccinated.  Pure hypercholesterolemia-has not wanted to be on statin medication.  Total cholesterol was 270 and LDL cholesterol 140.  Her HDL is  113 and triglycerides are normal at 77  Elevated liver functions-SGOT 36 and SGPT 45.  Previously were much higher in May 2020.  Seems to be related to alcohol consumption.  She will watch this.  Return to clinic in 6 months and will need liver functions at that time as well as lipid panel.

## 2020-05-12 ENCOUNTER — Other Ambulatory Visit: Payer: Self-pay | Admitting: Internal Medicine

## 2020-07-24 ENCOUNTER — Other Ambulatory Visit: Payer: Self-pay | Admitting: Internal Medicine

## 2020-08-29 ENCOUNTER — Telehealth: Payer: Self-pay | Admitting: Internal Medicine
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
## 2020-08-30 NOTE — Telephone Encounter (Signed)
Called patient and let her know she would need to go to Emergency room to be evaluated, she verbalized understanding.

## 2020-08-30 NOTE — Telephone Encounter (Signed)
Attempted to call patient back, her mail box was full, unable to leave voice mail.

## 2020-08-30 NOTE — Telephone Encounter (Signed)
Please go to ED where she can get proper emergency evaluation like imaging of her brain.
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
## 2020-09-12 ENCOUNTER — Ambulatory Visit (INDEPENDENT_AMBULATORY_CARE_PROVIDER_SITE_OTHER): Payer: 59 | Admitting: Internal Medicine

## 2020-09-12 ENCOUNTER — Encounter: Payer: Self-pay | Admitting: Internal Medicine

## 2020-09-12 ENCOUNTER — Other Ambulatory Visit: Payer: Self-pay

## 2020-09-12 VITALS — BP 120/70 | HR 94 | Ht 69.0 in | Wt 170.0 lb

## 2020-09-12 DIAGNOSIS — R55 Syncope and collapse: Secondary | ICD-10-CM

## 2020-09-12 NOTE — Patient Instructions (Signed)
Please do not drive long distances without someone in the car with you.  If you feel unwell in any way please pull over and do not continue to drive.  Neurology and Cardiology evaluations are being requested.  Labs drawn and are pending.
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
## 2020-09-13 LAB — COMPLETE METABOLIC PANEL WITH GFR
AG Ratio: 2.1 (calc) (ref 1.0–2.5)
ALT: 14 U/L (ref 6–29)
AST: 19 U/L (ref 10–35)
Albumin: 4.5 g/dL (ref 3.6–5.1)
Alkaline phosphatase (APISO): 68 U/L (ref 37–153)
BUN: 14 mg/dL (ref 7–25)
CO2: 30 mmol/L (ref 20–32)
Calcium: 10.2 mg/dL (ref 8.6–10.4)
Chloride: 104 mmol/L (ref 98–110)
Creat: 0.82 mg/dL (ref 0.50–1.05)
GFR, Est African American: 93 mL/min/{1.73_m2} (ref >=60)
GFR, Est Non African American: 80 mL/min/{1.73_m2} (ref >=60)
Globulin: 2.1 g/dL (calc) (ref 1.9–3.7)
Glucose, Bld: 110 mg/dL — ABNORMAL HIGH (ref 65–99)
Potassium: 5.5 mmol/L — ABNORMAL HIGH (ref 3.5–5.3)
Sodium: 142 mmol/L (ref 135–146)
Total Bilirubin: 0.4 mg/dL (ref 0.2–1.2)
Total Protein: 6.6 g/dL (ref 6.1–8.1)

## 2020-09-13 LAB — CBC WITH DIFFERENTIAL/PLATELET
Absolute Monocytes: 495 cells/uL (ref 200–950)
Basophils Absolute: 39 cells/uL (ref 0–200)
Basophils Relative: 0.7 %
Eosinophils Absolute: 143 cells/uL (ref 15–500)
Eosinophils Relative: 2.6 %
HCT: 43.3 % (ref 35.0–45.0)
Hemoglobin: 14.8 g/dL (ref 11.7–15.5)
Lymphs Abs: 1689 cells/uL (ref 850–3900)
MCH: 33.2 pg — ABNORMAL HIGH (ref 27.0–33.0)
MCHC: 34.2 g/dL (ref 32.0–36.0)
MCV: 97.1 fL (ref 80.0–100.0)
MPV: 10.1 fL (ref 7.5–12.5)
Monocytes Relative: 9 %
Neutro Abs: 3135 cells/uL (ref 1500–7800)
Neutrophils Relative %: 57 %
Platelets: 271 10*3/uL (ref 140–400)
RBC: 4.46 10*6/uL (ref 3.80–5.10)
RDW: 11.2 % (ref 11.0–15.0)
Total Lymphocyte: 30.7 %
WBC: 5.5 10*3/uL (ref 3.8–10.8)

## 2020-09-13 LAB — POCT URINALYSIS DIPSTICK
Appearance: NEGATIVE
Bilirubin, UA: NEGATIVE
Blood, UA: NEGATIVE
Glucose, UA: NEGATIVE
Ketones, UA: NEGATIVE
Leukocytes, UA: NEGATIVE
Nitrite, UA: NEGATIVE
Odor: NEGATIVE
Protein, UA: NEGATIVE
Spec Grav, UA: 1.01 (ref 1.010–1.025)
Urobilinogen, UA: 0.2 E.U./dL
pH, UA: 6.5 (ref 5.0–8.0)

## 2020-09-13 LAB — T4, FREE: Free T4: 1.3 ng/dL (ref 0.8–1.8)

## 2020-09-13 LAB — SEDIMENTATION RATE: Sed Rate: 9 mm/h (ref 0–30)

## 2020-09-13 LAB — TSH: TSH: 1.95 mIU/L (ref 0.40–4.50)

## 2020-09-15 LAB — DRUG MONITORING, PANEL 8 WITH CONFIRMATION, URINE
6 Acetylmorphine: NEGATIVE ng/mL (ref <10)
Alcohol Metabolites: POSITIVE ng/mL — AB
Amphetamines: NEGATIVE ng/mL (ref <500)
Benzodiazepines: NEGATIVE ng/mL (ref <100)
Buprenorphine, Urine: NEGATIVE ng/mL (ref <5)
Cocaine Metabolite: NEGATIVE ng/mL (ref <150)
Creatinine: 45.7 mg/dL
Ethyl Glucuronide (ETG): 19225 ng/mL — ABNORMAL HIGH (ref <500)
Ethyl Sulfate (ETS): 4407 ng/mL — ABNORMAL HIGH (ref <100)
MDMA: NEGATIVE ng/mL (ref <500)
Marijuana Metabolite: 139 ng/mL — ABNORMAL HIGH (ref <5)
Marijuana Metabolite: POSITIVE ng/mL — AB (ref <20)
Opiates: NEGATIVE ng/mL (ref <100)
Oxidant: NEGATIVE ug/mL
Oxycodone: NEGATIVE ng/mL (ref <100)
pH: 7.5 (ref 4.5–9.0)

## 2020-09-15 LAB — DM TEMPLATE
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
## 2020-10-02 ENCOUNTER — Encounter: Payer: Self-pay | Admitting: *Deleted

## 2020-10-02 ENCOUNTER — Other Ambulatory Visit: Payer: Self-pay

## 2020-10-02 ENCOUNTER — Encounter: Payer: Self-pay | Admitting: Cardiology

## 2020-10-02 ENCOUNTER — Encounter: Payer: Self-pay | Admitting: Neurology

## 2020-10-02 ENCOUNTER — Ambulatory Visit (INDEPENDENT_AMBULATORY_CARE_PROVIDER_SITE_OTHER): Payer: 59 | Admitting: Cardiology

## 2020-10-02 ENCOUNTER — Ambulatory Visit (INDEPENDENT_AMBULATORY_CARE_PROVIDER_SITE_OTHER): Payer: 59 | Admitting: Neurology

## 2020-10-02 VITALS — BP 143/89 | HR 82 | Ht 69.0 in | Wt 171.0 lb

## 2020-10-02 VITALS — BP 122/90 | HR 84 | Ht 69.0 in | Wt 172.4 lb

## 2020-10-02 DIAGNOSIS — R55 Syncope and collapse: Secondary | ICD-10-CM

## 2020-10-02 NOTE — Patient Instructions (Signed)
Medication Instructions:  Your physician recommends that you continue on your current medications as directed. Please refer to the Current Medication list given to you today.  *If you need a refill on your cardiac medications before your next appointment, please call your pharmacy*   Lab Work: none If you have labs (blood work) drawn today and your tests are completely normal, you will receive your results only by: Marland Kitchen MyChart Message (if you have MyChart) OR . A paper copy in the mail If you have any lab test that is abnormal or we need to change your treatment, we will call you to review the results.   Testing/Procedures: Your physician has requested that you have an echocardiogram. Echocardiography is a painless test that uses sound waves to create images of your heart. It provides your doctor with information about the size and shape of your heart and how well your heart's chambers and valves are working. This procedure takes approximately one hour. There are no restrictions for this procedure.   Your physician has recommended that you wear an event monitor. Event monitors are medical devices that record the heart's electrical activity. Doctors most often [place] these monitors to diagnose arrhythmias. Arrhythmias are problems with the speed or rhythm of the heartbeat. The monitor is a small, portable device. You can wear one while you do your normal daily activities. This is usually used to diagnose what is causing palpitations/syncope (passing out).  Dr Johney Frame has recommended you have a Calcium Score CT  Follow-Up: At [facility], you and your health needs are our priority.  As part of our continuing mission to provide you with exceptional heart care, we have created designated Provider Care Teams.  These Care Teams include your primary Cardiologist (physician) and Advanced Practice Providers (APPs -  Physician Assistants and Nurse Practitioners) who all work together to provide you with  the care you need, when you need it.  We recommend signing up for the patient portal called "MyChart".  Sign up information is provided on this After Visit Summary.  MyChart is used to connect with patients for Virtual Visits (Telemedicine).  Patients are able to view lab/test results, encounter notes, upcoming appointments, etc.  Non-urgent messages can be sent to your provider as well.   To learn more about what you can do with MyChart, go to [email or web address].    Your next appointment:   2 month(s)  The format for your next appointment:   In Person  Provider:   Gwyndolyn Kaufman, MD   Other Instructions   [employer] Cardiac Event Monitor Instructions Your physician has requested you wear your cardiac event monitor for _30____ days, (1-30). [employer] may call or text to confirm a shipping address. The monitor will be sent to a land address via UPS. [employer] will not ship a monitor to a PO BOX. It typically takes 3-5 days to receive your monitor after it has been enrolled. [employer] will assist with USPS tracking if your package is delayed. The telephone number for [employer] is [phone]. Once you have received your monitor, please review the enclosed instructions. Instruction tutorials can also be viewed under help and settings on the enclosed cell phone. Your monitor has already been registered assigning a specific monitor serial # to you.  Applying the monitor Remove cell phone from case and turn it on. The cell phone works as Dealer and needs to be within [employer] of you at all times. The cell phone will need to be charged on a  daily basis. We recommend you plug the cell phone into the enclosed charger at your bedside table every night.  Monitor batteries: You will receive two monitor batteries labelled #1 and #2. These are your recorders. Plug battery #2 onto the second connection on the enclosed charger. Keep one battery on the charger at all  times. This will keep the monitor battery deactivated. It will also keep it fully charged for when you need to switch your monitor batteries. A small light will be blinking on the battery emblem when it is charging. The light on the battery emblem will remain on when the battery is fully charged.  Open package of a Monitor strip. Insert battery #1 into black hood on strip and gently squeeze monitor battery onto connection as indicated in instruction booklet. Set aside while preparing skin.  Choose location for your strip, vertical or horizontal, as indicated in the instruction booklet. Shave to remove all hair from location. There cannot be any lotions, oils, powders, or colognes on skin where monitor is to be applied. Wipe skin clean with enclosed Saline wipe. Dry skin completely.  Peel paper labeled #1 off the back of the Monitor strip exposing the adhesive. Place the monitor on the chest in the vertical or horizontal position shown in the instruction booklet. One arrow on the monitor strip must be pointing upward. Carefully remove paper labeled #2, attaching remainder of strip to your skin. Try not to create any folds or wrinkles in the strip as you apply it.  Firmly press and release the circle in the center of the monitor battery. You will hear a small beep. This is turning the monitor battery on. The heart emblem on the monitor battery will light up every 5 seconds if the monitor battery in turned on and connected to the patient securely. Do not push and hold the circle down as this turns the monitor battery off. The cell phone will locate the monitor battery. A screen will appear on the cell phone checking the connection of your monitor strip. This may read poor connection initially but change to good connection within the next minute. Once your monitor accepts the connection you will hear a series of 3 beeps followed by a climbing crescendo of beeps. A screen will appear on the cell  phone showing the two monitor strip placement options. Touch the picture that demonstrates where you applied the monitor strip.  Your monitor strip and battery are waterproof. You are able to shower, bathe, or swim with the monitor on. They just ask you do not submerge deeper than 3 feet underwater. We recommend removing the monitor if you are swimming in a lake, river, or ocean.  Your monitor battery will need to be switched to a fully charged monitor battery approximately once a week. The cell phone will alert you of an action which needs to be made.  On the cell phone, tap for details to reveal connection status, monitor battery status, and cell phone battery status. The green dots indicates your monitor is in good status. A red dot indicates there is something that needs your attention.  To record a symptom, click the circle on the monitor battery. In 30-60 seconds a list of symptoms will appear on the cell phone. Select your symptom and tap save. Your monitor will record a sustained or significant arrhythmia regardless of you clicking the button. Some patients do not feel the heart rhythm irregularities. [employer] will notify us of any serious or critical events.  Refer to instruction booklet for instructions on switching batteries, changing strips, the Do not disturb or Pause features, or any additional questions.  Call [employer] at [phone], to confirm your monitor is transmitting and record your baseline. They will answer any questions you may have regarding the monitor instructions at that time.  Returning the monitor to [place] all equipment back into blue box. Peel off strip of paper to expose adhesive and close box securely. There is a prepaid UPS shipping label on this box. Drop in a UPS drop box, or at a UPS facility like [employer]. You may also contact [employer] to arrange UPS to pick up monitor package at your home.

## 2020-10-02 NOTE — Patient Instructions (Signed)
     Syncope Syncope is when you pass out (faint) for a short time. It is caused by a sudden decrease in blood flow to the brain. Signs that you may be about to pass out include:  Feeling dizzy or light-headed.  Feeling sick to your stomach (nauseous).  Seeing all white or all black.  Having cold, clammy skin. If you pass out, get help right away. Call your local emergency services (911 in the [place] Do not drive yourself to the hospital. Follow these instructions at home: Watch for any changes in your symptoms. Take these actions to stay safe and help with your symptoms: Lifestyle  Do not drive, use machinery, or play sports until your doctor says it is okay.  Do not drink alcohol.  Do not use any products that contain nicotine or tobacco, such as cigarettes and e-cigarettes. If you need help quitting, ask your doctor.  Drink enough fluid to keep your pee (urine) pale yellow. General instructions  Take over-the-counter and prescription medicines only as told by your doctor.  If you are taking blood pressure or heart medicine, sit up and stand up slowly. Spend a few minutes getting ready to sit and then stand. This can help you feel less dizzy.  Have someone stay with you until you feel stable.  If you start to feel like you might pass out, lie down right away and raise (elevate) your feet above the level of your heart. Breathe deeply and steadily. Wait until all of the symptoms are gone.  Keep all follow-up visits as told by your doctor. This is important. Get help right away if:  You have a very bad headache.  You pass out once or more than once.  You have pain in your chest, belly, or back.  You have a very fast or uneven heartbeat (palpitations).  It hurts to breathe.  You are bleeding from your mouth or your bottom (rectum).  You have black or tarry poop (stool).  You have jerky movements that you cannot control (seizure).  You are confused.  You have  trouble walking.  You are very weak.  You have vision problems. These symptoms may be an emergency. Do not wait to see if the symptoms will go away. Get medical help right away. Call your local emergency services (911 in the [place] Do not drive yourself to the hospital. Summary  Syncope is when you pass out (faint) for a short time. It is caused by a sudden decrease in blood flow to the brain.  Signs that you may be about to faint include feeling dizzy, light-headed, or sick to your stomach, seeing all white or all black, or having cold, clammy skin.  If you start to feel like you might pass out, lie down right away and raise (elevate) your feet above the level of your heart. Breathe deeply and steadily. Wait until all of the symptoms are gone. This information is not intended to replace advice given to you by your health care provider. Make sure you discuss any questions you have with your health care provider. Document Revised: 11/25/2017 Document Reviewed: 11/25/2017 Elsevier Patient Education  2020 Elsevier Inc.  

## 2020-10-02 NOTE — Progress Notes (Signed)
Patient ID: Ashley Hammond, female   DOB: 25-Sep-1966, 54 y.o.   MRN: [id number] Patient enrolled for [employer] to ship a 30 day cardiac event monitor to her home.
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
## 2020-10-03 ENCOUNTER — Telehealth: Payer: Self-pay | Admitting: Neurology

## 2020-10-03 ENCOUNTER — Ambulatory Visit (INDEPENDENT_AMBULATORY_CARE_PROVIDER_SITE_OTHER): Payer: 59 | Admitting: Neurology

## 2020-10-03 DIAGNOSIS — R55 Syncope and collapse: Secondary | ICD-10-CM

## 2020-10-03 NOTE — Telephone Encounter (Signed)
[employer] order sent to GI. They will obtain the auth and reach out to the patient to schedule.  

## 2020-10-04 NOTE — Progress Notes (Signed)
Thank you for your careful evaluation. Recent drug screen positive for alcohol and marijuana.

## 2020-10-04 NOTE — Progress Notes (Signed)
This is an unusual EEG recording : The bifrontal slowing was striking but is also symmetrical and not associated with epileptiform discharges , I would still consider it abnormal.  Bifrontal slowing can sometimes be seen after a hygroma formation over a previous frontal head injury, it can be the result of a high dose of GABAergic medication or long-term use of some substances.   At this time, I would like to correlate the EEG with a future MRI result.  Brain MRI with and without contrast is pending at this time.
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
## 2020-10-07 ENCOUNTER — Encounter (INDEPENDENT_AMBULATORY_CARE_PROVIDER_SITE_OTHER): Payer: 59

## 2020-10-07 DIAGNOSIS — R55 Syncope and collapse: Secondary | ICD-10-CM | POA: Diagnosis not present

## 2020-10-08 ENCOUNTER — Encounter: Payer: Self-pay | Admitting: Neurology

## 2020-10-22 ENCOUNTER — Other Ambulatory Visit: Payer: Self-pay | Admitting: Internal Medicine

## 2020-10-23 ENCOUNTER — Other Ambulatory Visit: Payer: Self-pay

## 2020-10-23 ENCOUNTER — Ambulatory Visit
Admission: RE | Admit: 2020-10-23 | Discharge: 2020-10-23 | Disposition: A | Discharge status: Home / Self Care | Payer: 59 | Source: Ambulatory Visit | Attending: Neurology | Admitting: Neurology

## 2020-10-23 DIAGNOSIS — R55 Syncope and collapse: Secondary | ICD-10-CM

## 2020-10-23 MED ORDER — GADOBENATE DIMEGLUMINE 529 MG/ML IV SOLN
15.0000 mL | Freq: Once | INTRAVENOUS | Status: AC | PRN
  Administered 2020-10-23: 15 mL via INTRAVENOUS

## 2020-10-25 ENCOUNTER — Ambulatory Visit (HOSPITAL_COMMUNITY): Payer: 59 | Attending: Cardiology

## 2020-10-25 ENCOUNTER — Other Ambulatory Visit: Payer: Self-pay

## 2020-10-25 ENCOUNTER — Ambulatory Visit
Admission: RE | Admit: 2020-10-25 | Discharge: 2020-10-25 | Disposition: A | Discharge status: Home / Self Care | Payer: Self-pay | Source: Ambulatory Visit | Attending: Cardiology | Admitting: Cardiology

## 2020-10-25 DIAGNOSIS — R55 Syncope and collapse: Secondary | ICD-10-CM | POA: Insufficient documentation

## 2020-10-25 LAB — ECHOCARDIOGRAM COMPLETE
Area-P 1/2: 3.1 cm2
P 1/2 time: 582 msec
S' Lateral: 2.9 cm

## 2020-10-29 ENCOUNTER — Telehealth: Payer: Self-pay

## 2020-10-29 ENCOUNTER — Telehealth: Payer: Self-pay | Admitting: Neurology

## 2020-10-29 DIAGNOSIS — E785 Hyperlipidemia, unspecified: Secondary | ICD-10-CM

## 2020-10-29 NOTE — Telephone Encounter (Signed)
Called and reviewed the MRI of the brain results. Advised there was small vessel disease changes noted. Reviewed the risk factors for the cause behind these changes. Advised there was no evidence of stroke, tumor or bleed. Patient verbalized understanding

## 2020-10-29 NOTE — Telephone Encounter (Signed)
-----   Message from Melvyn Novas, MD sent at 10/29/2020  1:50 PM EST ----- Remote scattered parenchymal changes, origin of small vessel disease is most likely. No stroke, bleed, tumor. No explanation for the syncope or LOC. Risk factors for small vessel disease are smoking, high BP, DM and migraine .

## 2020-10-29 NOTE — Telephone Encounter (Signed)
Left the patient a message to call office back regarding results.

## 2020-10-29 NOTE — Progress Notes (Signed)
Remote scattered parenchymal changes, origin of small vessel disease is most likely. No stroke, bleed, tumor. No explanation for the syncope or LOC. Risk factors for small vessel disease are smoking, high BP, DM and migraine .

## 2020-10-29 NOTE — Telephone Encounter (Signed)
-----   Message from Meriam Sprague, MD sent at 10/26/2020 11:06 AM EST ----- Ashley Hammond coronary calcium score is 94 which is the 94% for age and sex matched controls. This means that she has some plaque in Ashley Hammond heart arteries and we need to be more aggressive in lowering Ashley Hammond cholesterol levels to prevent this from getting worse. Can we start Ashley Hammond on Crestor 10mg  daily and repeat lipid panel in 6-8weeks? Otherwise there were no acute findings.

## 2020-10-30 MED ORDER — ROSUVASTATIN CALCIUM 10 MG PO TABS: 10.0000 mg | ORAL_TABLET | Freq: Every day | ORAL | 3 refills | Status: DC

## 2020-10-30 NOTE — Telephone Encounter (Signed)
Pt aware of CT Ca scoring and recommendations. She agrees to begin Crestor 10mg  qhs and repeat FLP's in March 2022. We also reviewed echo results and recommendations discussing regular BP monitoring and when to notify the office of HTN.   She had no additional needs or questions.

## 2020-10-30 NOTE — Telephone Encounter (Signed)
-----   Message from Heather E Pemberton, MD sent at 10/26/2020 11:06 AM EST ----- Her coronary calcium score is 94 which is the 94% for age and sex matched controls. This means that she has some plaque in her heart arteries and we need to be more aggressive in lowering her cholesterol levels to prevent this from getting worse. Can we start her on Crestor 10mg daily and repeat lipid panel in 6-8weeks? Otherwise there were no acute findings. 

## 2020-11-14 ENCOUNTER — Other Ambulatory Visit: Payer: 59
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
## 2020-12-04 ENCOUNTER — Ambulatory Visit (INDEPENDENT_AMBULATORY_CARE_PROVIDER_SITE_OTHER): Payer: 59 | Admitting: Cardiology

## 2020-12-04 ENCOUNTER — Other Ambulatory Visit: Payer: Self-pay

## 2020-12-04 ENCOUNTER — Encounter: Payer: Self-pay | Admitting: Cardiology

## 2020-12-04 VITALS — BP 134/82 | HR 87 | Ht 69.0 in | Wt 166.6 lb

## 2020-12-04 DIAGNOSIS — E785 Hyperlipidemia, unspecified: Secondary | ICD-10-CM

## 2020-12-04 DIAGNOSIS — I2510 Atherosclerotic heart disease of native coronary artery without angina pectoris: Secondary | ICD-10-CM

## 2020-12-04 DIAGNOSIS — I77810 Thoracic aortic ectasia: Secondary | ICD-10-CM | POA: Diagnosis not present

## 2020-12-04 DIAGNOSIS — R55 Syncope and collapse: Secondary | ICD-10-CM | POA: Diagnosis not present

## 2020-12-04 MED ORDER — ASPIRIN EC 81 MG PO TBEC: 81.0000 mg | DELAYED_RELEASE_TABLET | Freq: Every day | ORAL | 3 refills | Status: DC

## 2020-12-04 NOTE — Patient Instructions (Addendum)
Medication Instructions:  Your physician has recommended you make the following change in your medication: Start aspirin 81 mg by mouth daily  *If you need a refill on your cardiac medications before your next appointment, please call your pharmacy*   Lab Work: none If you have labs (blood work) drawn today and your tests are completely normal, you will receive your results only by: Marland Kitchen MyChart Message (if you have MyChart) OR . A paper copy in the mail If you have any lab test that is abnormal or we need to change your treatment, we will call you to review the results.   Testing/Procedures: none   Follow-Up: At [facility], you and your health needs are our priority.  As part of our continuing mission to provide you with exceptional heart care, we have created designated Provider Care Teams.  These Care Teams include your primary Cardiologist (physician) and Advanced Practice Providers (APPs -  Physician Assistants and Nurse Practitioners) who all work together to provide you with the care you need, when you need it.  We recommend signing up for the patient portal called "MyChart".  Sign up information is provided on this After Visit Summary.  MyChart is used to connect with patients for Virtual Visits (Telemedicine).  Patients are able to view lab/test results, encounter notes, upcoming appointments, etc.  Non-urgent messages can be sent to your provider as well.   To learn more about what you can do with MyChart, go to [email or web address].    Your next appointment:   6 month(s)  The format for your next appointment:   In Person  Provider:   You may see Freada Bergeron, MD or one of the following Advanced Practice Providers on your designated Care Team:    Richardson Dopp, PA-C  Robbie Lis, [place]    Other Instructions

## 2020-12-06 ENCOUNTER — Encounter: Payer: Self-pay | Admitting: Neurology

## 2020-12-06 ENCOUNTER — Ambulatory Visit (INDEPENDENT_AMBULATORY_CARE_PROVIDER_SITE_OTHER): Payer: 59 | Admitting: Neurology

## 2020-12-06 VITALS — BP 124/80 | HR 88 | Ht 69.0 in | Wt 166.0 lb

## 2020-12-06 DIAGNOSIS — R55 Syncope and collapse: Secondary | ICD-10-CM | POA: Diagnosis not present
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
## 2020-12-06 NOTE — Patient Instructions (Signed)
Seizure precautions:  Seizure, Adult A seizure is a sudden burst of abnormal electrical and chemical activity in the brain. Seizures usually last from 30 seconds to 2 minutes.  What are the causes? Common causes of this condition include:  Fever or infection.  Problems that affect the brain. These may include: ? A brain or head injury. ? Bleeding in the brain. ? A brain tumor.  Low levels of blood sugar or salt.  Kidney problems or liver problems.  Conditions that are passed from parent to child (are inherited).  Problems with a substance, such as: ? Having a reaction to a drug or a medicine. ? Stopping the use of a substance all of a sudden (withdrawal).  A stroke.  Disorders that affect how you develop. Sometimes, the cause may not be known.  What increases the risk?  Having someone in your family who has epilepsy. In this condition, seizures happen again and again over time. They have no clear cause.  Having had a tonic-clonic seizure before. This type of seizure causes you to: ? Tighten the muscles of the whole body. ? Lose consciousness.  Having had a head injury or strokes before.  Having had a lack of oxygen at birth. What are the signs or symptoms? There are many types of seizures. The symptoms vary depending on the type of seizure you have. Symptoms during a seizure  Shaking that you cannot control (convulsions) with fast, jerky movements of muscles.  Stiffness of the body.  Breathing problems.  Feeling mixed up (confused).  Staring or not responding to sound or touch.  Head nodding.  Eyes that blink, flutter, or move fast.  Drooling, grunting, or making clicking sounds with your mouth  Losing control of when you pee or poop. Symptoms before a seizure  Feeling afraid, nervous, or worried.  Feeling like you may vomit.  Feeling like: ? You are moving when you are not. ? Things around you are moving when they are not.  Feeling like you saw  or heard something before (dj vu).  Odd tastes or smells.  Changes in how you see. You may see flashing lights or spots. Symptoms after a seizure  Feeling confused.  Feeling sleepy.  Headache.  Sore muscles. How is this treated? If your seizure stops on its own, you will not need treatment. If your seizure lasts longer than 5 minutes, you will normally need treatment. Treatment may include:  Medicines given through an IV tube.  Avoiding things, such as medicines, that are known to cause your seizures.  Medicines to prevent seizures.  A device to prevent or control seizures.  Surgery.  A diet low in carbohydrates and high in fat (ketogenic diet). Follow these instructions at home: Medicines  Take over-the-counter and prescription medicines only as told by your doctor.  Avoid foods or drinks that may keep your medicine from working, such as alcohol. Activity  Follow instructions about driving, swimming, or doing things that would be dangerous if you had another seizure. Wait until your doctor says it is safe for you to do these things.  If you live in the [place] ask your local department of motor vehicles when you can drive.  Get a lot of rest. Teaching others  Teach friends and family what to do when you have a seizure. They should: ? Help you get down to the ground. ? Protect your head and body. ? Loosen any clothing around your neck. ? Turn you on your side. ? Know  whether or not you need emergency care. ? Stay with you until you are better.  Also, tell them what not to do if you have a seizure. Tell them: ? They should not hold you down. ? They should not put anything in your mouth.   General instructions  Avoid anything that gives you seizures.  Keep a seizure diary. Write down: ? What you remember about each seizure. ? What you think caused each seizure.  Keep all follow-up visits. Contact a doctor if:  You have another seizure or seizures. Call  the doctor each time you have a seizure.  The pattern of your seizures changes.  You keep having seizures with treatment.  You have symptoms of being sick or having an infection.  You are not able to take your medicine. Get help right away if:  You have any of these problems: ? A seizure that lasts longer than 5 minutes. ? Many seizures in a row and you do not feel better between seizures. ? A seizure that makes it harder to breathe. ? A seizure and you can no longer speak or use part of your body.  You do not wake up right after a seizure.  You get hurt during a seizure.  You feel confused or have pain right after a seizure. These symptoms may be an emergency. Get help right away. Call your local emergency services (911 in the [place]  Do not wait to see if the symptoms will go away.  Do not drive yourself to the hospital. Summary  A seizure is a sudden burst of abnormal electrical and chemical activity in the brain. Seizures normally last from 30 seconds to 2 minutes.  Causes of seizures include illness, injury to the head, low levels of blood sugar or salt, and certain conditions.  Most seizures will stop on their own in less than 5 minutes. Seizures that last longer than 5 minutes are a medical emergency and need treatment right away.  Many medicines are used to treat seizures. Take over-the-counter and prescription medicines only as told by your doctor. This information is not intended to replace advice given to you by your health care provider. Make sure you discuss any questions you have with your health care provider. Document Revised: 04/20/2020 Document Reviewed: 04/20/2020 Elsevier Patient Education  [place].

## 2021-01-02 ENCOUNTER — Other Ambulatory Visit: Payer: 59

## 2021-01-27 ENCOUNTER — Other Ambulatory Visit: Payer: Self-pay | Admitting: Internal Medicine

## 2021-01-27 NOTE — Telephone Encounter (Signed)
CPE due in June. Please book before refilling

## 2021-01-28 NOTE — Telephone Encounter (Signed)
Left detailed message.   

## 2021-02-04 NOTE — Telephone Encounter (Signed)
OK 

## 2021-02-04 NOTE — Telephone Encounter (Signed)
LVM to CB and schedule an appointment so we can refill medication

## 2021-04-22 DIAGNOSIS — Z0289 Encounter for other administrative examinations: Secondary | ICD-10-CM

## 2021-04-30 ENCOUNTER — Other Ambulatory Visit: Payer: Self-pay | Admitting: Internal Medicine

## 2021-05-07 ENCOUNTER — Other Ambulatory Visit: Payer: 59 | Admitting: Internal Medicine

## 2021-05-07 ENCOUNTER — Other Ambulatory Visit: Payer: Self-pay

## 2021-05-07 DIAGNOSIS — E7800 Pure hypercholesterolemia, unspecified: Secondary | ICD-10-CM

## 2021-05-07 DIAGNOSIS — Z0000 Encounter for general adult medical examination without abnormal findings: Secondary | ICD-10-CM

## 2021-05-07 DIAGNOSIS — E039 Hypothyroidism, unspecified: Secondary | ICD-10-CM

## 2021-05-07 DIAGNOSIS — F5101 Primary insomnia: Secondary | ICD-10-CM

## 2021-05-07 LAB — CBC WITH DIFFERENTIAL/PLATELET
Absolute Monocytes: 423 cells/uL (ref 200–950)
Basophils Absolute: 31 cells/uL (ref 0–200)
Basophils Relative: 0.6 %
Eosinophils Absolute: 311 cells/uL (ref 15–500)
Eosinophils Relative: 6.1 %
HCT: 43.8 % (ref 35.0–45.0)
Hemoglobin: 14.5 g/dL (ref 11.7–15.5)
Lymphs Abs: 1413 cells/uL (ref 850–3900)
MCH: 32.7 pg (ref 27.0–33.0)
MCHC: 33.1 g/dL (ref 32.0–36.0)
MCV: 98.9 fL (ref 80.0–100.0)
MPV: 10.3 fL (ref 7.5–12.5)
Monocytes Relative: 8.3 %
Neutro Abs: 2922 cells/uL (ref 1500–7800)
Neutrophils Relative %: 57.3 %
Platelets: 257 10*3/uL (ref 140–400)
RBC: 4.43 10*6/uL (ref 3.80–5.10)
RDW: 11.6 % (ref 11.0–15.0)
Total Lymphocyte: 27.7 %
WBC: 5.1 10*3/uL (ref 3.8–10.8)

## 2021-05-07 LAB — COMPLETE METABOLIC PANEL WITH GFR
AG Ratio: 1.8 (calc) (ref 1.0–2.5)
ALT: 16 U/L (ref 6–29)
AST: 22 U/L (ref 10–35)
Albumin: 4.5 g/dL (ref 3.6–5.1)
Alkaline phosphatase (APISO): 73 U/L (ref 37–153)
BUN: 11 mg/dL (ref 7–25)
CO2: 29 mmol/L (ref 20–32)
Calcium: 10.1 mg/dL (ref 8.6–10.4)
Chloride: 103 mmol/L (ref 98–110)
Creat: 0.83 mg/dL (ref 0.50–1.03)
Globulin: 2.5 g/dL (calc) (ref 1.9–3.7)
Glucose, Bld: 102 mg/dL — ABNORMAL HIGH (ref 65–99)
Potassium: 5.2 mmol/L (ref 3.5–5.3)
Sodium: 142 mmol/L (ref 135–146)
Total Bilirubin: 0.5 mg/dL (ref 0.2–1.2)
Total Protein: 7 g/dL (ref 6.1–8.1)
eGFR: 83 mL/min/{1.73_m2} (ref >=60)

## 2021-05-07 LAB — LIPID PANEL
Cholesterol: 217 mg/dL — ABNORMAL HIGH (ref <200)
HDL: 141 mg/dL (ref >=50)
LDL Cholesterol (Calc): 63 mg/dL (calc)
Non-HDL Cholesterol (Calc): 76 mg/dL (calc) (ref <130)
Total CHOL/HDL Ratio: 1.5 (calc) (ref <5.0)
Triglycerides: 46 mg/dL (ref <150)

## 2021-05-07 LAB — TSH: TSH: 2.33 mIU/L (ref 0.40–4.50)

## 2021-05-09 ENCOUNTER — Ambulatory Visit (INDEPENDENT_AMBULATORY_CARE_PROVIDER_SITE_OTHER): Payer: 59 | Admitting: Internal Medicine

## 2021-05-09 ENCOUNTER — Encounter: Payer: Self-pay | Admitting: Internal Medicine

## 2021-05-09 ENCOUNTER — Other Ambulatory Visit (HOSPITAL_COMMUNITY)
Admission: RE | Admit: 2021-05-09 | Discharge: 2021-05-09 | Disposition: A | Discharge status: Home / Self Care | Payer: 59 | Source: Ambulatory Visit | Attending: Internal Medicine | Admitting: Internal Medicine

## 2021-05-09 ENCOUNTER — Other Ambulatory Visit: Payer: Self-pay

## 2021-05-09 VITALS — BP 140/90 | HR 86 | Ht 69.0 in | Wt 156.0 lb

## 2021-05-09 DIAGNOSIS — F439 Reaction to severe stress, unspecified: Secondary | ICD-10-CM

## 2021-05-09 DIAGNOSIS — M25562 Pain in left knee: Secondary | ICD-10-CM

## 2021-05-09 DIAGNOSIS — Z8616 Personal history of COVID-19: Secondary | ICD-10-CM

## 2021-05-09 DIAGNOSIS — R7989 Other specified abnormal findings of blood chemistry: Secondary | ICD-10-CM

## 2021-05-09 DIAGNOSIS — E039 Hypothyroidism, unspecified: Secondary | ICD-10-CM | POA: Diagnosis not present

## 2021-05-09 DIAGNOSIS — R55 Syncope and collapse: Secondary | ICD-10-CM

## 2021-05-09 DIAGNOSIS — F32A Depression, unspecified: Secondary | ICD-10-CM

## 2021-05-09 DIAGNOSIS — N939 Abnormal uterine and vaginal bleeding, unspecified: Secondary | ICD-10-CM

## 2021-05-09 DIAGNOSIS — E7800 Pure hypercholesterolemia, unspecified: Secondary | ICD-10-CM | POA: Diagnosis not present

## 2021-05-09 DIAGNOSIS — Z124 Encounter for screening for malignant neoplasm of cervix: Secondary | ICD-10-CM | POA: Diagnosis not present

## 2021-05-09 DIAGNOSIS — Z0000 Encounter for general adult medical examination without abnormal findings: Secondary | ICD-10-CM

## 2021-05-09 DIAGNOSIS — F5101 Primary insomnia: Secondary | ICD-10-CM | POA: Diagnosis not present

## 2021-05-09 DIAGNOSIS — F419 Anxiety disorder, unspecified: Secondary | ICD-10-CM

## 2021-05-09 LAB — POCT URINALYSIS DIPSTICK
Appearance: NEGATIVE
Bilirubin, UA: NEGATIVE
Blood, UA: NEGATIVE
Glucose, UA: NEGATIVE
Ketones, UA: NEGATIVE
Leukocytes, UA: NEGATIVE
Nitrite, UA: NEGATIVE
Odor: NEGATIVE
Protein, UA: NEGATIVE
Spec Grav, UA: 1.01 (ref 1.010–1.025)
Urobilinogen, UA: 0.2 E.U./dL
pH, UA: 6.5 (ref 5.0–8.0)
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
## 2021-05-09 NOTE — Patient Instructions (Signed)
Referral to [facility] for counseling and med consult. Back to Neurology regarding recent syncope and possible seizure. To see Dr. Berenice Primas regarding left knee pain. To see GYN regarding abnormal vaginal bleeding.Watch ETOH consumption.

## 2021-05-10 ENCOUNTER — Telehealth: Payer: Self-pay | Admitting: Internal Medicine

## 2021-05-10 ENCOUNTER — Other Ambulatory Visit: Payer: Self-pay | Admitting: Internal Medicine

## 2021-05-10 NOTE — Telephone Encounter (Signed)
Faxed referral to Noemi Chapel at [facility], [phone], phone [phone]

## 2021-05-11 ENCOUNTER — Other Ambulatory Visit: Payer: Self-pay | Admitting: Internal Medicine

## 2021-05-17 LAB — CYTOLOGY - PAP
Comment: NEGATIVE
Diagnosis: NEGATIVE
Diagnosis: REACTIVE
High risk HPV: POSITIVE — AB

## 2021-05-20 ENCOUNTER — Other Ambulatory Visit (HOSPITAL_COMMUNITY)
Admission: RE | Admit: 2021-05-20 | Discharge: 2021-05-20 | Disposition: A | Discharge status: Home / Self Care | Payer: 59 | Source: Ambulatory Visit | Attending: Obstetrics and Gynecology | Admitting: Obstetrics and Gynecology

## 2021-05-20 ENCOUNTER — Telehealth: Payer: Self-pay | Admitting: Internal Medicine

## 2021-05-20 ENCOUNTER — Ambulatory Visit (INDEPENDENT_AMBULATORY_CARE_PROVIDER_SITE_OTHER): Payer: 59 | Admitting: Obstetrics and Gynecology

## 2021-05-20 ENCOUNTER — Encounter: Payer: Self-pay | Admitting: Obstetrics and Gynecology

## 2021-05-20 ENCOUNTER — Other Ambulatory Visit: Payer: Self-pay

## 2021-05-20 VITALS — BP 136/72 | HR 66 | Ht 69.0 in | Wt 166.0 lb

## 2021-05-20 DIAGNOSIS — N882 Stricture and stenosis of cervix uteri: Secondary | ICD-10-CM | POA: Diagnosis not present

## 2021-05-20 DIAGNOSIS — N950 Postmenopausal bleeding: Secondary | ICD-10-CM | POA: Insufficient documentation

## 2021-05-20 DIAGNOSIS — A630 Anogenital (venereal) warts: Secondary | ICD-10-CM

## 2021-05-20 NOTE — Telephone Encounter (Signed)
-----   Message from Elby Showers, MD sent at 05/17/2021  8:08 PM EDT ----- Please book appointment to discuss Pap.  Reassure her she does not have cancer of the cervix. ----- Message ----- From: Inocencio Homes, [place]: 05/09/2021   5:21 PM EDT To: Elby Showers, MD

## 2021-05-20 NOTE — Telephone Encounter (Signed)
Called patient to schedule an appointment and she already had an appointment scheduled for today with GYN. So she was going to see if she could discuss with her, if not she will call back and schedule an appointment.

## 2021-05-20 NOTE — Progress Notes (Signed)
GYNECOLOGY  VISIT   HPI: 55 y.o.   Married White or Caucasian Not Hispanic or Latino  female   [phone] with No LMP recorded. Patient is postmenopausal.   here for postmenopausal bleeding on and off for 2-4 months. Patient had a pap done at PCP that shows HPV.   In 9/20, she had a mirena IUD removed. At that time her [facility] was 86.6. No bleeding after that visit until the several months. Small amounts, intermittently.   Pap from 05/09/21 returned as negative with +HPV Prior pap from 06/30/19 returned as negative with +HPV  GYNECOLOGIC HISTORY: No LMP recorded. Patient is postmenopausal. Contraception:postmenopausal  Menopausal hormone therapy: none         OB History     Gravida  2   Para  2   Term  2   Preterm      AB      Living  2      SAB      IAB      Ectopic      Multiple      Live Births  2              Patient Active Problem List   Diagnosis Date Noted   Episode of loss of consciousness 10/02/2020   Elevated liver enzymes 04/17/2019   Hypothyroidism 04/17/2019   Anxiety and depression 04/17/2019   Pure hypercholesterolemia 04/24/2018   Right ankle pain 07/27/2013   Insomnia 04/23/2011    No past medical history on file.  Past Surgical History:  Procedure Laterality Date   ANKLE SURGERY      Current Outpatient Medications  Medication Sig Dispense Refill   ALPRAZolam (XANAX) 0.5 MG tablet Take 1 tablet (0.5 mg total) by mouth at bedtime as needed for anxiety. 90 tablet 1   Ascorbic Acid (VITAMIN C) 100 MG tablet Take 200 mg by mouth daily.     aspirin EC 81 MG tablet Take 1 tablet (81 mg total) by mouth daily. Swallow whole. 90 tablet 3   buPROPion (WELLBUTRIN XL) 300 MG 24 hr tablet TAKE ONE TABLET BY MOUTH DAILY 90 tablet 1   FLUoxetine (PROZAC) 10 MG capsule TAKE ONE CAPSULE BY MOUTH DAILY 90 capsule 0   levothyroxine (SYNTHROID) 50 MCG tablet TAKE ONE TABLET BY MOUTH DAILY ON AN EMPTY STOMACH 30 MINUTES BEFORE BREAKFAST 90 tablet 0    magnesium 30 MG tablet Take 30 mg by mouth 2 (two) times daily.     zinc gluconate 50 MG tablet Take 50 mg by mouth daily.     zolpidem (AMBIEN) 10 MG tablet TAKE ONE TABLET BY MOUTH EVERY NIGHT AT BEDTIME AS NEEDED 30 tablet 0   rosuvastatin (CRESTOR) 10 MG tablet Take 1 tablet (10 mg total) by mouth daily. 90 tablet 3   No current facility-administered medications for this visit.     ALLERGIES: Patient has no known allergies.  Family History  Problem Relation Age of Onset   Diabetes Brother    Heart attack Father    Sudden death Neg Hx    Hyperlipidemia Neg Hx    Hypertension Neg Hx    Breast cancer Neg Hx     Social History   Socioeconomic History   Marital status: Married    Spouse name: Not on file   Number of children: Not on file   Years of education: Not on file   Highest education level: Not on file  Occupational History   Not on file  Tobacco Use   Smoking status: Former    Types: Cigarettes    Quit date: 2016    Years since quitting: 6.5   Smokeless tobacco: Never   Tobacco comments:    20+ yrs  Vaping Use   Vaping Use: Never used  Substance and Sexual Activity   Alcohol use: Yes    Alcohol/week: 10.0 standard drinks    Types: 10 Standard drinks or equivalent per week   Drug use: Never   Sexual activity: Not Currently    Birth control/protection: I.U.D., Post-menopausal  Other Topics Concern   Not on file  Social History Narrative   Not on file   Social Determinants of Health   Financial Resource Strain: Not on file  Food Insecurity: Not on file  Transportation Needs: Not on file  Physical Activity: Not on file  Stress: Not on file  Social Connections: Not on file  Intimate Partner Violence: Not on file    ROS  PHYSICAL EXAMINATION:    BP 136/72   Pulse 66   Ht '5\' 9"'$  (1.753 m)   Wt 166 lb (75.3 kg)   SpO2 100%   BMI 24.51 kg/m     General appearance: alert, cooperative and appears stated age  Pelvic: External genitalia:  no  lesions              Urethra:  normal appearing urethra with no masses, tenderness or lesions              Bartholins and Skenes: normal                 Vagina: normal appearing vagina with normal color and discharge, no lesions              Cervix: no lesions and stenotic and friable              Bimanual Exam:  Uterus:  normal size, contour, position, consistency, mobility, non-tender              Adnexa: no mass, fullness, tenderness               The risks of endometrial biopsy were reviewed and a consent was obtained.  A speculum was placed in the vagina and the cervix was cleansed with betadine. A tenaculum was placed on the cervix, the pipelle couldn't be passed through the cervix. The cervix was dilated with the mini-dilators to the #3 hagar dilator. The the uterine evacuator was placed into the endometrial cavity. The uterus sounded to 6 cm. The endometrial biopsy was performed, taking care to get a representative sample, sampling 360 degrees of the uterine cavity. Minimal tissue was obtained, the cavity had the characteristically gritty texture. The tenaculum and speculum were removed. There were no complications.   Chaperone was present for exam.  1. Postmenopausal bleeding - Surgical pathology( [place]/ [facility]). Given the minimal tissue, suspicious for an endometrial polyp as the source of the bleeding - US PELVIS TRANSVAGINAL NON-OB (TV ONLY); Future   2. Cervical stenosis (uterine cervix) Needed dilation  3. HPV (human papilloma virus) anogenital infection Reviewed recent pap with Dr Rexene Alberts and prior pap. Both pap's were negative with +HPV. She needs a colposcopy, reviewed with the patient.  - Colposcopy; Future

## 2021-05-21 ENCOUNTER — Telehealth: Payer: Self-pay

## 2021-05-21 NOTE — Telephone Encounter (Signed)
[facility] Triage "Dr Talbert Nan ordered an Ultrasound and a colposcopy and patient thought Dr Talbert Nan told her she would be asleep for the procedure. She wants to know which one? I informed her neither but she was sure that Dr.Jertson told her that."  I called and discussed with patient that both colposcopy and U/s are performed in the office and we are not able to offer anesthesia that "puts you to sleep".  I told her I thought that Dr. Talbert Nan may have been explaining hysteroscopy to her in the event she confirms endometrial polyp and needs to have outpatient surgery to have it removed and she would be "put to sleep".  We discussed the discomfort with colposcopy and that if Advil or Aleve is something she can take that she could take a dose 30 minutes prior to colpo to help with any residual cramping. Patient reassured.

## 2021-05-22 LAB — SURGICAL PATHOLOGY

## 2021-05-30 ENCOUNTER — Ambulatory Visit (INDEPENDENT_AMBULATORY_CARE_PROVIDER_SITE_OTHER): Payer: 59 | Admitting: Obstetrics and Gynecology

## 2021-05-30 ENCOUNTER — Other Ambulatory Visit: Payer: Self-pay

## 2021-05-30 ENCOUNTER — Encounter: Payer: Self-pay | Admitting: Obstetrics and Gynecology

## 2021-05-30 ENCOUNTER — Ambulatory Visit (INDEPENDENT_AMBULATORY_CARE_PROVIDER_SITE_OTHER): Payer: 59

## 2021-05-30 VITALS — BP 116/64 | HR 88 | Ht 69.0 in | Wt 166.0 lb

## 2021-05-30 DIAGNOSIS — N950 Postmenopausal bleeding: Secondary | ICD-10-CM

## 2021-05-30 DIAGNOSIS — N882 Stricture and stenosis of cervix uteri: Secondary | ICD-10-CM

## 2021-05-30 MED ORDER — MISOPROSTOL 200 MCG PO TABS: ORAL_TABLET | ORAL | 0 refills | Status: DC

## 2021-05-30 NOTE — Progress Notes (Signed)
GYNECOLOGY  VISIT   HPI: 55 y.o.   Married White or Caucasian Not Hispanic or Latino  female   [phone] with No LMP recorded. Patient is postmenopausal.   here for an ultrasound for PMP bleeding. She was having light bleeding that lasted for a month. Endometrial biopsy on 05/20/21 with benign inactive endometrium.   GYNECOLOGIC HISTORY: No LMP recorded. Patient is postmenopausal. Contraception:pmp Menopausal hormone therapy: none         OB History     Gravida  2   Para  2   Term  2   Preterm      AB      Living  2      SAB      IAB      Ectopic      Multiple      Live Births  2              Patient Active Problem List   Diagnosis Date Noted   Episode of loss of consciousness 10/02/2020   Elevated liver enzymes 04/17/2019   Hypothyroidism 04/17/2019   Anxiety and depression 04/17/2019   Pure hypercholesterolemia 04/24/2018   Acquired hallux varus of left foot 04/02/2018   Arthritis of right ankle 04/02/2018   Right ankle pain 07/27/2013   Insomnia 04/23/2011    No past medical history on file.  Past Surgical History:  Procedure Laterality Date   ANKLE SURGERY      Current Outpatient Medications  Medication Sig Dispense Refill   ALPRAZolam (XANAX) 0.5 MG tablet Take 1 tablet (0.5 mg total) by mouth at bedtime as needed for anxiety. 90 tablet 1   Ascorbic Acid (VITAMIN C) 100 MG tablet Take 200 mg by mouth daily.     aspirin EC 81 MG tablet Take 1 tablet (81 mg total) by mouth daily. Swallow whole. 90 tablet 3   buPROPion (WELLBUTRIN XL) 300 MG 24 hr tablet TAKE ONE TABLET BY MOUTH DAILY 90 tablet 1   FLUoxetine (PROZAC) 10 MG capsule TAKE ONE CAPSULE BY MOUTH DAILY 90 capsule 0   levothyroxine (SYNTHROID) 50 MCG tablet TAKE ONE TABLET BY MOUTH DAILY ON AN EMPTY STOMACH 30 MINUTES BEFORE BREAKFAST 90 tablet 0   magnesium 30 MG tablet Take 30 mg by mouth 2 (two) times daily.     rosuvastatin (CRESTOR) 10 MG tablet Take 1 tablet (10 mg total) by mouth  daily. 90 tablet 3   zinc gluconate 50 MG tablet Take 50 mg by mouth daily.     zolpidem (AMBIEN) 10 MG tablet TAKE ONE TABLET BY MOUTH EVERY NIGHT AT BEDTIME AS NEEDED 30 tablet 0   No current facility-administered medications for this visit.     ALLERGIES: Patient has no known allergies.  Family History  Problem Relation Age of Onset   Diabetes Brother    Heart attack Father    Sudden death Neg Hx    Hyperlipidemia Neg Hx    Hypertension Neg Hx    Breast cancer Neg Hx     Social History   Socioeconomic History   Marital status: Married    Spouse name: Not on file   Number of children: Not on file   Years of education: Not on file   Highest education level: Not on file  Occupational History   Not on file  Tobacco Use   Smoking status: Former    Types: Cigarettes    Quit date: 2016    Years since quitting: 6.5  Smokeless tobacco: Never   Tobacco comments:    20+ yrs  Vaping Use   Vaping Use: Never used  Substance and Sexual Activity   Alcohol use: Yes    Alcohol/week: 10.0 standard drinks    Types: 10 Standard drinks or equivalent per week   Drug use: Never   Sexual activity: Not Currently    Birth control/protection: I.U.D., Post-menopausal  Other Topics Concern   Not on file  Social History Narrative   Not on file   Social Determinants of Health   Financial Resource Strain: Not on file  Food Insecurity: Not on file  Transportation Needs: Not on file  Physical Activity: Not on file  Stress: Not on file  Social Connections: Not on file  Intimate Partner Violence: Not on file    Review of Systems  All other systems reviewed and are negative.  PHYSICAL EXAMINATION:    There were no vitals taken for this visit.    General appearance: alert, cooperative and appears stated age  Pelvic ultrasound  Indications:   Findings:  Uterus 5.77 x 3.06 x 2.66 cm  Endometrium 1.56 mm, thin, symmetric  Left ovary 2.01 x 1.39 x 1.31 cm  Right ovary 2.01  x 1.13 x 1.24 cm  No free fluid  Impression:  Small anteverted uterus Thin, symmetrical endometrium Normal ovaries bilaterally No free fluid.  1. Postmenopausal bleeding Inactive endometrium on biopsy, thin endometrium on ultrasound. We discussed that this is very reassuring. Her bleeding does seem more than you would expect from atrophy. If her bleeding persists over the next few months, would recommend hysteroscopy  2. Cervical stenosis (uterine cervix) Returning for a colposcopy later this month. Will pretreat with cytotec.  - misoprostol (CYTOTEC) 200 MCG tablet; Place 2 tablets vaginally 6-12 hours prior to your appointment.  Dispense: 2 tablet; Refill: 0

## 2021-06-06 NOTE — Telephone Encounter (Signed)
LVM with Dr Lauree Chandler message and advise patient to call DR Graves office since this was about her knee and if she had an other question to call office.

## 2021-06-17 ENCOUNTER — Other Ambulatory Visit: Payer: Self-pay

## 2021-06-17 ENCOUNTER — Other Ambulatory Visit (HOSPITAL_COMMUNITY)
Admission: RE | Admit: 2021-06-17 | Discharge: 2021-06-17 | Disposition: A | Discharge status: Home / Self Care | Payer: 59 | Source: Ambulatory Visit | Attending: Obstetrics and Gynecology | Admitting: Obstetrics and Gynecology

## 2021-06-17 ENCOUNTER — Ambulatory Visit (INDEPENDENT_AMBULATORY_CARE_PROVIDER_SITE_OTHER): Payer: 59 | Admitting: Obstetrics and Gynecology

## 2021-06-17 ENCOUNTER — Encounter: Payer: Self-pay | Admitting: Obstetrics and Gynecology

## 2021-06-17 VITALS — BP 130/84 | HR 95 | Ht 69.0 in | Wt 164.0 lb

## 2021-06-17 DIAGNOSIS — C539 Malignant neoplasm of cervix uteri, unspecified: Secondary | ICD-10-CM | POA: Diagnosis not present

## 2021-06-17 DIAGNOSIS — A630 Anogenital (venereal) warts: Secondary | ICD-10-CM

## 2021-06-17 DIAGNOSIS — N888 Other specified noninflammatory disorders of cervix uteri: Secondary | ICD-10-CM

## 2021-06-17 NOTE — Patient Instructions (Signed)

## 2021-06-17 NOTE — Progress Notes (Signed)
GYNECOLOGY  VISIT   HPI: 55 y.o.   Married White or Caucasian Not Hispanic or Latino  female   [phone] with No LMP recorded. Patient is postmenopausal.   here for colposcopy.    Pap from 05/09/21 returned as negative with +HPV Prior pap from 06/30/19 returned as negative with +HPV  Patient has a h/o cervical stenosis, she did not use the cytotec that was prescribed.   The patient has been evaluated in the last month for PMP bleeding. Inactive endometrium on biopsy and thin endometrium on ultrasound. She has continued to bleed since her last visit in early August. Currently the bleeding is light, at times it is very heavy.  GYNECOLOGIC HISTORY: No LMP recorded. Patient is postmenopausal. Contraception:PMP  Menopausal hormone therapy: none         OB History     Gravida  2   Para  2   Term  2   Preterm      AB      Living  2      SAB      IAB      Ectopic      Multiple      Live Births  2              Patient Active Problem List   Diagnosis Date Noted   Episode of loss of consciousness 10/02/2020   Elevated liver enzymes 04/17/2019   Hypothyroidism 04/17/2019   Anxiety and depression 04/17/2019   Pure hypercholesterolemia 04/24/2018   Acquired hallux varus of left foot 04/02/2018   Arthritis of right ankle 04/02/2018   Right ankle pain 07/27/2013   Insomnia 04/23/2011    No past medical history on file.  Past Surgical History:  Procedure Laterality Date   ANKLE SURGERY      Current Outpatient Medications  Medication Sig Dispense Refill   ALPRAZolam (XANAX) 0.5 MG tablet Take 1 tablet (0.5 mg total) by mouth at bedtime as needed for anxiety. 90 tablet 1   Ascorbic Acid (VITAMIN C) 100 MG tablet Take 200 mg by mouth daily.     aspirin EC 81 MG tablet Take 1 tablet (81 mg total) by mouth daily. Swallow whole. 90 tablet 3   buPROPion (WELLBUTRIN XL) 300 MG 24 hr tablet TAKE ONE TABLET BY MOUTH DAILY 90 tablet 1   FLUoxetine (PROZAC) 10 MG capsule  TAKE ONE CAPSULE BY MOUTH DAILY 90 capsule 0   levothyroxine (SYNTHROID) 50 MCG tablet TAKE ONE TABLET BY MOUTH DAILY ON AN EMPTY STOMACH 30 MINUTES BEFORE BREAKFAST 90 tablet 0   magnesium 30 MG tablet Take 30 mg by mouth 2 (two) times daily.     misoprostol (CYTOTEC) 200 MCG tablet Place 2 tablets vaginally 6-12 hours prior to your appointment. 2 tablet 0   zinc gluconate 50 MG tablet Take 50 mg by mouth daily.     zolpidem (AMBIEN) 10 MG tablet TAKE ONE TABLET BY MOUTH EVERY NIGHT AT BEDTIME AS NEEDED 30 tablet 0   rosuvastatin (CRESTOR) 10 MG tablet Take 1 tablet (10 mg total) by mouth daily. 90 tablet 3   No current facility-administered medications for this visit.     ALLERGIES: Patient has no known allergies.  Family History  Problem Relation Age of Onset   Diabetes Brother    Heart attack Father    Sudden death Neg Hx    Hyperlipidemia Neg Hx    Hypertension Neg Hx    Breast cancer Neg Hx  Social History   Socioeconomic History   Marital status: Married    Spouse name: Not on file   Number of children: Not on file   Years of education: Not on file   Highest education level: Not on file  Occupational History   Not on file  Tobacco Use   Smoking status: Former    Types: Cigarettes    Quit date: 2016    Years since quitting: 6.6   Smokeless tobacco: Never   Tobacco comments:    20+ yrs  Vaping Use   Vaping Use: Never used  Substance and Sexual Activity   Alcohol use: Yes    Alcohol/week: 10.0 standard drinks    Types: 10 Standard drinks or equivalent per week   Drug use: Never   Sexual activity: Not Currently    Birth control/protection: I.U.D., Post-menopausal  Other Topics Concern   Not on file  Social History Narrative   Not on file   Social Determinants of Health   Financial Resource Strain: Not on file  Food Insecurity: Not on file  Transportation Needs: Not on file  Physical Activity: Not on file  Stress: Not on file  Social Connections: Not  on file  Intimate Partner Violence: Not on file    Review of Systems  All other systems reviewed and are negative.  PHYSICAL EXAMINATION:    BP 130/84   Pulse 95   Ht '5\' 9"'$  (1.753 m)   Wt 164 lb (74.4 kg)   SpO2 99%   BMI 24.22 kg/m     General appearance: alert, cooperative and appears stated age  Pelvic: External genitalia:  no lesions              Urethra:  normal appearing urethra with no masses, tenderness or lesions              Bartholins and Skenes: normal                 Vagina: normal appearing vagina with normal color and discharge, no lesions, moderate to large amount of blood in the vagina.               Cervix:  very friable, unable to tell if the blood is coming from her cervix or from within her uterus. As soon as I wipe the blood away it wells back up.               Colposcopy: unsatisfactory, limited by active bleeding which ultimately appears to be coming from the cervix. Acetic acid placed, ? Mild aceto-white posteriorly, clearly bleeding from the posterior lip. ECC done without difficulty. A biopsy was taken from the cervix at 6 o'clock. Silver nitrate and pressure were used on the friable portion of the cervix (360 degrees around the external os). Ultimately hemostasis was obtained. Negative lugols examination of the upper vagina.   Chaperone was present for exam.  1. HPV (human papilloma virus) anogenital infection Unsatisfactory - Colposcopy - Surgical pathology( [place]/ [facility])  2. Friable cervix Negative pap with +HPV, colposcopy with biopsy and ECC today. -Treated with silver nitrate -Can consider trial of boric acid or cautery of the cervix -If she continues to bleed she will need further evaluation/treatment.

## 2021-06-20 ENCOUNTER — Ambulatory Visit (INDEPENDENT_AMBULATORY_CARE_PROVIDER_SITE_OTHER): Payer: 59 | Admitting: Obstetrics and Gynecology

## 2021-06-20 ENCOUNTER — Other Ambulatory Visit: Payer: Self-pay

## 2021-06-20 ENCOUNTER — Encounter: Payer: Self-pay | Admitting: Obstetrics and Gynecology

## 2021-06-20 VITALS — BP 130/80 | HR 85 | Ht 69.0 in | Wt 164.0 lb

## 2021-06-20 DIAGNOSIS — C539 Malignant neoplasm of cervix uteri, unspecified: Secondary | ICD-10-CM | POA: Diagnosis not present

## 2021-06-20 NOTE — Progress Notes (Signed)
GYNECOLOGY  VISIT   HPI: 55 y.o.   Married White or Caucasian Not Hispanic or Latino  female   [phone] with No LMP recorded. Patient is postmenopausal.   here for Biopsy results. 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   Her pap in 9/20 returned as negative, +HPV, -HPV 16/18/45 Pap in 7/22 Negative with + HPV  GYNECOLOGIC HISTORY: No LMP recorded. Patient is postmenopausal. Contraception: PMP Menopausal hormone therapy: none         OB History     Gravida  2   Para  2   Term  2   Preterm      AB      Living  2      SAB      IAB      Ectopic      Multiple      Live Births  2              Patient Active Problem List   Diagnosis Date Noted   Episode of loss of consciousness 10/02/2020   Elevated liver enzymes 04/17/2019   Hypothyroidism 04/17/2019   Anxiety and depression 04/17/2019   Pure hypercholesterolemia 04/24/2018   Acquired hallux varus of left foot 04/02/2018   Arthritis of right ankle 04/02/2018   Right ankle pain 07/27/2013   Insomnia 04/23/2011    No past medical history on file.  Past Surgical History:  Procedure Laterality Date   ANKLE SURGERY      Current Outpatient Medications  Medication Sig Dispense Refill   ALPRAZolam (XANAX) 0.5 MG tablet Take 1 tablet (0.5 mg total) by mouth at bedtime as needed for anxiety. 90 tablet 1   Ascorbic Acid (VITAMIN C) 100 MG tablet Take 200 mg by mouth daily.     aspirin EC 81 MG tablet Take 1 tablet (81 mg total) by mouth daily. Swallow whole. 90 tablet 3   buPROPion (WELLBUTRIN XL) 300 MG 24 hr tablet TAKE ONE TABLET BY MOUTH DAILY 90 tablet 1   FLUoxetine (PROZAC) 10 MG capsule TAKE ONE CAPSULE BY MOUTH DAILY 90 capsule 0   levothyroxine (SYNTHROID) 50 MCG tablet TAKE ONE TABLET BY MOUTH DAILY ON AN EMPTY STOMACH 30 MINUTES BEFORE BREAKFAST 90 tablet 0   magnesium 30 MG tablet Take 30 mg by mouth 2 (two) times daily.      rosuvastatin (CRESTOR) 10 MG tablet Take 1 tablet (10 mg total) by mouth daily. 90 tablet 3   zinc gluconate 50 MG tablet Take 50 mg by mouth daily.     zolpidem (AMBIEN) 10 MG tablet TAKE ONE TABLET BY MOUTH EVERY NIGHT AT BEDTIME AS NEEDED 30 tablet 0   No current facility-administered medications for this visit.     ALLERGIES: Patient has no known allergies.  Family History  Problem Relation Age of Onset   Diabetes Brother    Heart attack Father    Sudden death Neg Hx    Hyperlipidemia Neg Hx    Hypertension Neg Hx    Breast cancer Neg Hx     Social History   Socioeconomic History   Marital status: Married    Spouse name: Not on file   Number of children: Not on file   Years of education: Not on file   Highest education level: Not on file  Occupational History   Not on file  Tobacco Use   Smoking status: Former    Types:  Cigarettes    Quit date: 2016    Years since quitting: 6.6   Smokeless tobacco: Never   Tobacco comments:    20+ yrs  Vaping Use   Vaping Use: Never used  Substance and Sexual Activity   Alcohol use: Yes    Alcohol/week: 10.0 standard drinks    Types: 10 Standard drinks or equivalent per week   Drug use: Never   Sexual activity: Not Currently    Birth control/protection: I.U.D., Post-menopausal  Other Topics Concern   Not on file  Social History Narrative   Not on file   Social Determinants of Health   Financial Resource Strain: Not on file  Food Insecurity: Not on file  Transportation Needs: Not on file  Physical Activity: Not on file  Stress: Not on file  Social Connections: Not on file  Intimate Partner Violence: Not on file    Review of Systems  All other systems reviewed and are negative.  PHYSICAL EXAMINATION:    BP 130/80   Pulse 85   Ht '5\' 9"'$  (1.753 m)   Wt 164 lb (74.4 kg)   SpO2 100%   BMI 24.22 kg/m     General appearance: alert, cooperative and appears stated age  34. Malignant neoplasm of cervix, unspecified  site [facility]) Discussed biopsy results with the patient  Referral to GYN Oncology placed

## 2021-06-21 ENCOUNTER — Telehealth: Payer: Self-pay | Admitting: *Deleted

## 2021-06-21 ENCOUNTER — Encounter: Payer: Self-pay | Admitting: Gynecologic Oncology

## 2021-06-21 NOTE — Telephone Encounter (Signed)
Patient scheduled on 06/26/21 @ 10:30am with Dr.Rossi.

## 2021-06-21 NOTE — Telephone Encounter (Signed)
Per Dr.Jertson patient needs referral to Gyn-oncology due to cervical cancer diagnosis. I sent a staff message to referral coordinator to call and schedule. Message was received by coordinator she will reach out to patient and schedule.

## 2021-06-21 NOTE — Telephone Encounter (Signed)
Spoke with the patient and scheduled a new patient appt for 8/31 at 10:30 am with Dr Denman George. Patient given the address and phone number for the clinic, along with the policy for mask and visitors

## 2021-06-24 ENCOUNTER — Telehealth: Payer: Self-pay

## 2021-06-24 ENCOUNTER — Ambulatory Visit: Payer: 59 | Admitting: Gynecologic Oncology

## 2021-06-24 NOTE — Telephone Encounter (Signed)
Spoke with Ashley Hammond, rescheduled her appointment to Wednesday 8/31 with Dr. Denman George at 10:30. Patient verbalized understanding.

## 2021-06-25 ENCOUNTER — Encounter: Payer: Self-pay | Admitting: Gynecologic Oncology

## 2021-06-26 ENCOUNTER — Other Ambulatory Visit: Payer: Self-pay

## 2021-06-26 ENCOUNTER — Encounter: Payer: Self-pay | Admitting: Gynecologic Oncology

## 2021-06-26 ENCOUNTER — Ambulatory Visit: Payer: 59 | Admitting: Gynecologic Oncology

## 2021-06-26 ENCOUNTER — Inpatient Hospital Stay (HOSPITAL_BASED_OUTPATIENT_CLINIC_OR_DEPARTMENT_OTHER): Payer: 59 | Admitting: Gynecologic Oncology

## 2021-06-26 ENCOUNTER — Inpatient Hospital Stay: Payer: 59 | Attending: Gynecologic Oncology | Admitting: Gynecologic Oncology

## 2021-06-26 VITALS — BP 107/83 | HR 93 | Temp 98.7°F | Resp 18 | Ht 69.0 in | Wt 165.8 lb

## 2021-06-26 DIAGNOSIS — Z87891 Personal history of nicotine dependence: Secondary | ICD-10-CM | POA: Insufficient documentation

## 2021-06-26 DIAGNOSIS — G4700 Insomnia, unspecified: Secondary | ICD-10-CM | POA: Diagnosis not present

## 2021-06-26 DIAGNOSIS — F32A Depression, unspecified: Secondary | ICD-10-CM | POA: Diagnosis not present

## 2021-06-26 DIAGNOSIS — C539 Malignant neoplasm of cervix uteri, unspecified: Secondary | ICD-10-CM

## 2021-06-26 DIAGNOSIS — E7800 Pure hypercholesterolemia, unspecified: Secondary | ICD-10-CM | POA: Insufficient documentation

## 2021-06-26 DIAGNOSIS — E039 Hypothyroidism, unspecified: Secondary | ICD-10-CM | POA: Diagnosis not present

## 2021-06-26 DIAGNOSIS — F419 Anxiety disorder, unspecified: Secondary | ICD-10-CM | POA: Diagnosis not present

## 2021-06-26 DIAGNOSIS — Z79899 Other long term (current) drug therapy: Secondary | ICD-10-CM | POA: Insufficient documentation

## 2021-06-26 LAB — SURGICAL PATHOLOGY

## 2021-06-26 MED ORDER — TRAMADOL HCL 50 MG PO TABS: 50.0000 mg | ORAL_TABLET | Freq: Four times a day (QID) | ORAL | 0 refills | Status: DC | PRN

## 2021-06-26 MED ORDER — SENNOSIDES-DOCUSATE SODIUM 8.6-50 MG PO TABS: 2.0000 | ORAL_TABLET | Freq: Every day | ORAL | 0 refills | Status: DC

## 2021-06-26 MED ORDER — IBUPROFEN 800 MG PO TABS: 800.0000 mg | ORAL_TABLET | Freq: Three times a day (TID) | ORAL | 0 refills | Status: DC | PRN

## 2021-06-26 NOTE — Progress Notes (Signed)
Consult Note: Gyn-Onc  Consult was requested by Dr. Talbert Nan for the evaluation of Ashley Hammond 55 y.o. female  CC:  Chief Complaint  Patient presents with   Malignant neoplasm of cervix, unspecified site [facility])    Assessment/Plan:  Ms. ZARYAH ACKERLY  is a 55 y.o.  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  We reviewed the patient's diagnosis of early stage cervical cancer. On today's examination she is a clinical stage IB1 with a gross lesion estimated at <2cm. Treatment options include radical hysterectomy with pelvic lymphadenectomy or radiation. The risks and benefits of both were reviewed. I have emphasized that the cure rates for these two treatments are equivalent; however, I discussed that the toxicity profiles are different between these two modalities.  Risks of primary radiotherapy with sensitizing chemotherapy can include cystitis, proctitis, chronic rectal bleeding sigmoid stricture, small bowel obstruction, ureteral stricture, urinary or enteral fistula, loss of sexual function related to vaginal shortening and atrophy and menopause due to loss of ovarian function.  Surgery is associated with risks of bleeding, blood transfusion, injury to the bowel, bladder, ureter, urinary fistula, vaginal dehiscence, prolonged or permanent bladder dysfunction potentially requiring self-catheterization, lymphedema, infection, thromboembolic events, and possible effects on sexual function. We discussed that intraoperatively, the finding of previously unsuspected spread outside the cervix may lead to the surgery being aborted. Additionally, high or intermediate-risk factors on the final pathology report, such as positive lymph nodes, deep invasion, large tumor size and lymphatic invasion, will likely lead to a recommendation of postoperative chemotherapy and radiation.  She desires to proceed with radical hysterectomy. Prior to surgery, a PET/CT has been/will be  ordered to rule out any distant metastasis that would preclude the patient from being a surgical candidate.  Assuming that the patient is a surgical candidate following imaging, radical hysterectomy via a minimally invasive approach or abdominal procedure was discussed. We reviewed that prior to early 2018 our practice had been to routinely proceed with a robotic assisted procedure. We discussed that prospective data with endometrial cancer showed that outcomes from a minimally invasive procedure were not inferior as compared to one performed via laparotomy. We also discussed that institution-specific retrospective data evaluating minimally invasive radical hysterectomy had not demonstrated worse outcomes as compared to an abdominal procedure. However, recent prospective data has suggested that there may be worse outcomes from a minimally invasive procedure. This data was published in November, 2018. A retrospective study from a [place] cancer database also seems to support this conclusion. The NCCN guidelines have described abdominal radical hysterectomy to be considered the standard of care for the treatment of early stage cervical cancer. We reviewed the results of these two studies.  Furthermore, we discussed that retrospective survival analysis of [place] surgeons' practice identified that the techniques associated with equivalent survival to open procedures were cerclage of the vagina prior to specimen amputation and lack of use of a uterine manipulator. I explained that both of these elements are in place in our surgical technique for minimally invasive radical hysterectomy.   The pros and cons of both procedures were discussed in detail based on available published data. These include, but are not limited to, blood loss, injury to surrounding organs, lymphedema, need for further surgery, wound complications, VTE, and long-term bladder and bowel dysfunction.   After our discussion, the patient wishes to  proceed with robotic assisted radical hysterectomy and bilateral salpingectomy with SLN biopsy.   We also specifically reviewed that her lesion size  is currently <2cm based on today's examination. She is aware that both of the aforementioned 2018 studies state that there is inconclusive evidence to suggest that a minimally invasive approach is inferior to open laparotomy in this subset of patients. Therefore, we do not have data to suggest recurrence outcomes vary at this time.  The patient and her friend had the opportunity to ask questions and their questions were answered to their satisfaction.   Postoperatively, the patient will have an indwelling catheter at the time of discharge. She also understands that additional therapy may be recommended based on the results of her final pathology.   I informed the patient that I will be leaving the practice at the end of September and volunteered that her surgery be performed by my partner as I would not be available to see her for routine postoperative care, nor management of complications after [id number]. The patient was understanding of this information but elected to proceed with surgery with me in September.   REFERENCES:  1. Rosendo Gros PT, Frumovitz M, Pareja R, et al. Minimally invasive versus abdominal radical hysterectomy for cervicalcancer. N Engl J Med. [id number]  2. Melamed A, Margul DJ, Chen L, et al. Survival after minimally invasive radical hysterectomy for early-stage cervical cancer. N Engl J Med. [id number]  3. Sert BM, Boggess JF, Heron Sabins, et al. Robot=assisted versus open radical hysterectomy: a multi-onstitutional experience for early-stage cervical cancer. Eur J Surg Oncol. 2016;42:513-22.  4. Concepcion Elk, Harlin Heys, [place], [place], [place]. Surgical and oncologic outcomes after robotic radical hysterectomy as compared to open radical hysterectomy in the treatment of early cervical cancer.  J Gynecol Oncol. 2017;28(6):e82.  5. Soliman PT, Frumovitz M, Sun CC, et al. Radical hysterectomy: a comparison of surgical approaches after adoption of robotic surgery in gynecologic oncology. Gynecol Oncol. 2011;123:333-336.iation    HPI: [name] is a 55 year old P2 who was seen in consultation at the request of Dr Talbert Nan for evaluation of cervical cancer.  The patient reported a history of light vaginal bleeding since approximately May 2022.  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  Due to the abnormal bleeding that she was receiving she was then recommended to follow-up with her gynecologist, Dr. Talbert Nan, who first evaluated the endometrium with an endometrial biopsy performed on 05/20/2021 which revealed benign inactive atrophic endometrium negative for hyperplasia or malignancy.  The bleeding continued and the patient returned for a colposcopic evaluation of the cervix with biopsies (due to the presence of high risk HPV).  This took place on 06/17/2021.  At the time of that colposcopic evaluation there was some abnormal bleeding noted and friability to the cervix.  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  Her medical history is most significant for depression and anxiety, hypercholesterolemia, hypothyroidism.  Her surgical history is most significant for no prior abdominal surgeries.  Her gynecologic history is remarkable for 2 prior vaginal deliveries.  She denied a history of abnormal Pap test prior to being tested positive for HPV in 2020.  She reported scant gynecologic care over the preceding decade, including infrequent Pap smear testing.  Her family cancer history is unremarkable.  She is a  non-smoker.  Current Meds:  Outpatient Encounter Medications as of 06/26/2021  Medication Sig   ALPRAZolam (XANAX) 0.5 MG tablet Take 1 tablet (0.5 mg total) by mouth at bedtime as needed for anxiety.   Apoaequorin (PREVAGEN EXTRA STRENGTH) 20 MG CAPS Take 1 capsule by mouth daily.   buPROPion (WELLBUTRIN XL) 300 MG 24 hr tablet TAKE ONE TABLET BY MOUTH DAILY   FLUoxetine (PROZAC) 10 MG capsule TAKE ONE CAPSULE BY MOUTH DAILY   ibuprofen (ADVIL) 800 MG tablet Take 1 tablet (800 mg total) by mouth every 8 (eight) hours as needed for moderate pain. For AFTER surgery only   levothyroxine (SYNTHROID) 50 MCG tablet TAKE ONE TABLET BY MOUTH DAILY ON AN EMPTY STOMACH 30 MINUTES BEFORE BREAKFAST   Magnesium 400 MG CAPS Take 30 mg by mouth daily.   rosuvastatin (CRESTOR) 10 MG tablet Take 10 mg by mouth daily.   senna-docusate (SENOKOT-S) 8.6-50 MG tablet Take 2 tablets by mouth at bedtime. For AFTER surgery, do not take if having diarrhea   traMADol (ULTRAM) 50 MG tablet Take 1 tablet (50 mg total) by mouth every 6 (six) hours as needed for severe pain. For AFTER surgery only, do not take and drive   vitamin C (ASCORBIC ACID) 500 MG tablet Take 500 mg by mouth daily.   zinc gluconate 50 MG tablet Take 50 mg by mouth daily.   zolpidem (AMBIEN) 10 MG tablet TAKE ONE TABLET BY MOUTH EVERY NIGHT AT BEDTIME AS NEEDED   aspirin EC 81 MG tablet Take 1 tablet (81 mg total) by mouth daily. Swallow whole. (Patient not taking: Reported on 06/25/2021)   No facility-administered encounter medications on file as of 06/26/2021.    Allergy: No Known Allergies  Social Hx:   Social History   Socioeconomic History   Marital status: Married    Spouse name: Not on file   Number of children: Not on file   Years of education: Not on file   Highest education level: Not on file  Occupational History   Not on file  Tobacco Use   Smoking status: Former    Packs/day: 0.25    Years: 25.00    Pack years: 6.25     Types: Cigarettes    Quit date: 2016    Years since quitting: 6.6   Smokeless tobacco: Never   Tobacco comments:    20+ yrs  Vaping Use   Vaping Use: Never used  Substance and Sexual Activity   Alcohol use: Yes    Alcohol/week: 10.0 standard drinks    Types: 10 Standard drinks or equivalent per week   Drug use: Never   Sexual activity: Not Currently    Birth control/protection: Post-menopausal  Other Topics Concern   Not on file  Social History Narrative   Not on file   Social Determinants of Health   Financial Resource Strain: Not on file  Food Insecurity: Not on file  Transportation Needs: Not on file  Physical Activity: Not on file  Stress: Not on file  Social Connections: Not on file  Intimate Partner Violence: Not on file    Past Surgical Hx:  Past Surgical History:  Procedure Laterality Date   ANKLE SURGERY Right    [facility]    Past Medical Hx:  Past Medical History:  Diagnosis Date   Anxiety    Arthritis of ankle    Right   Depression    Episode of loss of consciousness    Hypothyroidism    Insomnia  Pure hypercholesterolemia     Past Gynecological History:  See HPI, SVD x 2. Infrequent paps and gynecologic care in decade preceding her cancer diagnosis. HPV positive. No LMP recorded. Patient is postmenopausal.  Family Hx:  Family History  Problem Relation Age of Onset   Heart attack Father    Diabetes Brother    Pancreatic cancer Maternal Grandmother    Ovarian cancer Other    Endometrial cancer Other    Colon cancer Other    Prostate cancer Other    Pancreatic cancer Other    Sudden death Neg Hx    Hyperlipidemia Neg Hx    Hypertension Neg Hx    Breast cancer Neg Hx     Review of Systems:  Constitutional  Feels well,    ENT Normal appearing ears and nares bilaterally Skin/Breast  No rash, sores, jaundice, itching, dryness Cardiovascular  No chest pain, shortness of breath, or edema  Pulmonary  No cough or wheeze.  Gastro  Intestinal  No nausea, vomitting, or diarrhoea. No bright red blood per rectum, no abdominal pain, change in bowel movement, or constipation.  Genito Urinary  No frequency, urgency, dysuria, + abnormal bleeding.  Musculo Skeletal  No myalgia, arthralgia, joint swelling or pain  Neurologic  No weakness, numbness, change in gait,  Psychology  No depression, anxiety, insomnia.   Vitals:  Blood pressure 107/83, pulse 93, temperature 98.7 F (37.1 C), temperature source Oral, resp. rate 18, height '5\' 9"'$  (1.753 m), weight 165 lb 12.8 oz (75.2 kg), SpO2 100 %.  Physical Exam: WD in NAD Neck  Supple NROM, without any enlargements.  Lymph Node Survey No cervical supraclavicular or inguinal adenopathy Cardiovascular  Well perfused peripheries Lungs  Non increased WOB Skin  No rash/lesions/breakdown  Psychiatry  Alert and oriented to person, place, and time  Abdomen  Normoactive bowel sounds, abdomen soft, non-tender and nonobese without evidence of hernia.  Back No CVA tenderness Genito Urinary  Vulva/vagina: Normal external female genitalia.   No lesions. No discharge or bleeding.  Bladder/urethra:  No lesions or masses, well supported bladder  Vagina: grossly visibly and palpably normal  Cervix: Central 1.5-2cm lesion on anterior inner cervical os. Ectocervix smooth and grossly normal. Cervix feels hard, but not abnormal in size dimensions. No palpable parametrial extension.   Uterus:  Small, mobile, no parametrial involvement or nodularity.  Adnexa: no palpable masses. Rectal  Good tone, no masses no cul de sac nodularity, no palpable parametrial disease.   Extremities  No bilateral cyanosis, clubbing or edema.  60 minutes of total time was spent for this patient encounter, including preparation, face-to-face counseling with the patient and coordination of care, review of imaging (results and images), communication with the referring provider and documentation of the  encounter.   Thereasa Solo, MD  06/26/2021, 6:18 PM

## 2021-06-26 NOTE — Patient Instructions (Addendum)
Plan to have a PET scan to evaluate for spread of the cancer. If your insurance denies the PET scan, we will have to do a CT scan first.   Preparing for your Surgery  Plan for surgery on July 16, 2021 with Dr. Everitt Amber at [place] will be scheduled for a robotic assisted laparoscopic radical hysterectomy (removal of the uterus and cervix/surrounding tissue), bilateral salpingo-oophorectomy (removal of both ovaries and fallopian tubes), sentinel lymph node biopsy, possible lymph node dissection.   You will be discharged home with a foley catheter in place. This will stay in place for one week and we will remove it in the office at your postop check.   Pre-operative Testing -You will receive a phone call from presurgical testing at [facility] to arrange for a pre-operative appointment and lab work.  -Bring your insurance card, copy of an advanced directive if applicable, medication list  -At that visit, you will be asked to sign a consent for a possible blood transfusion in case a transfusion becomes necessary during surgery.  The need for a blood transfusion is rare but having consent is a necessary part of your care.     -You should not be taking blood thinners or aspirin at least ten days prior to surgery unless instructed by your surgeon. STOP TAKING YOUR ASPIRIN NOW.  -Do not take supplements such as fish oil (omega 3), red yeast rice, turmeric before your surgery. You want to avoid medications with aspirin in them including headache powders such as BC or Goody's), Excedrin migraine.  Day Before Surgery at [place] will be asked to take in a light diet the day before surgery. You will be advised you can have clear liquids up until 3 hours before your surgery.    Eat a light diet the day before surgery.  Examples including soups, broths, toast, yogurt, mashed potatoes.  AVOID GAS PRODUCING FOODS. Things to avoid include carbonated beverages (fizzy beverages,  sodas), raw fruits and raw vegetables (uncooked), or beans.   If your bowels are filled with gas, your surgeon will have difficulty visualizing your pelvic organs which increases your surgical risks.  Your role in recovery Your role is to become active as soon as directed by your doctor, while still giving yourself time to heal.  Rest when you feel tired. You will be asked to do the following in order to speed your recovery:  - Cough and breathe deeply. This helps to clear and expand your lungs and can prevent pneumonia after surgery.  - [place]. Do mild physical activity. Walking or moving your legs help your circulation and body functions return to normal. Do not try to get up or walk alone the first time after surgery.   -If you develop swelling on one leg or the other, pain in the back of your leg, redness/warmth in one of your legs, please call the office or go to the Emergency Room to have a doppler to rule out a blood clot. For shortness of breath, chest pain-seek care in the Emergency Room as soon as possible. - Actively manage your pain. Managing your pain lets you move in comfort. We will ask you to rate your pain on a scale of zero to 10. It is your responsibility to tell your doctor or nurse where and how much you hurt so your pain can be treated.  Special Considerations -If you are diabetic, you may be placed on insulin  after surgery to have closer control over your blood sugars to promote healing and recovery.  This does not mean that you will be discharged on insulin.  If applicable, your oral antidiabetics will be resumed when you are tolerating a solid diet.  -Your final pathology results from surgery should be available around one week after surgery and the results will be relayed to you when available.  -Dr. Lahoma Crocker is the surgeon that assists your GYN Oncologist with surgery.  If you end up staying the night, the next day after your surgery you  will either see Dr. Denman George, Dr. Berline Lopes, or Dr. Lahoma Crocker.  -FMLA forms can be faxed to [phone] and please allow 5-7 business days for completion.  Pain Management After Surgery -You have been prescribed your pain medication and bowel regimen medications before surgery so that you can have these available when you are discharged from the hospital. The pain medication is for use ONLY AFTER surgery and a new prescription will not be given.   -Make sure that you have Tylenol and Ibuprofen at home to use on a regular basis after surgery for pain control. We recommend alternating the medications every hour to six hours since they work differently and are processed in the body differently for pain relief.  THERE CAN BE AN INCREASED RISK OF GASTROINTESTINAL BLEEDING WITH THE USE OF PROZAC AND IBUPROFEN. WOULD RECOMMEND USING THE IBUPROFEN SPARINGLY AND TAKE WITH FOOD.   -Review the attached handout on narcotic use and their risks and side effects.   Bowel Regimen -You have been prescribed Sennakot-S to take nightly to prevent constipation especially if you are taking the narcotic pain medication intermittently.  It is important to prevent constipation and drink adequate amounts of liquids. You can stop taking this medication when you are not taking pain medication and you are back on your normal bowel routine.  Risks of Surgery Risks of surgery are low but include bleeding, infection, damage to surrounding structures, re-operation, blood clots, and very rarely death.   Blood Transfusion Information (For the consent to be signed before surgery)  We will be checking your blood type before surgery so in case of emergencies, we will know what type of blood you would need.                                            WHAT IS A BLOOD TRANSFUSION?  A transfusion is the replacement of blood or some of its parts. Blood is made up of multiple cells which provide different functions. Red blood  cells carry oxygen and are used for blood loss replacement. White blood cells fight against infection. Platelets control bleeding. Plasma helps clot blood. Other blood products are available for specialized needs, such as hemophilia or other clotting disorders. BEFORE THE TRANSFUSION  Who gives blood for transfusions?  You may be able to donate blood to be used at a later date on yourself (autologous donation). Relatives can be asked to donate blood. This is generally not any safer than if you have received blood from a stranger. The same precautions are taken to ensure safety when a relative's blood is donated. Healthy volunteers who are fully evaluated to make sure their blood is safe. This is blood bank blood. Transfusion therapy is the safest it has ever been in the practice of medicine. Before blood is taken from a  donor, a complete history is taken to make sure that person has no history of diseases nor engages in risky social behavior (examples are intravenous drug use or sexual activity with multiple partners). The donor's travel history is screened to minimize risk of transmitting infections, such as malaria. The donated blood is tested for signs of infectious diseases, such as HIV and hepatitis. The blood is then tested to be sure it is compatible with you in order to minimize the chance of a transfusion reaction. If you or a relative donates blood, this is often done in anticipation of surgery and is not appropriate for emergency situations. It takes many days to process the donated blood. RISKS AND COMPLICATIONS Although transfusion therapy is very safe and saves many lives, the main dangers of transfusion include:  Getting an infectious disease. Developing a transfusion reaction. This is an allergic reaction to something in the blood you were given. Every precaution is taken to prevent this. The decision to have a blood transfusion has been considered carefully by your caregiver before  blood is given. Blood is not given unless the benefits outweigh the risks.  AFTER SURGERY INSTRUCTIONS  Return to work: 4 weeks if applicable  Activity: 1. Be up and out of the bed during the day.  Take a nap if needed.  You may walk up steps but be careful and use the hand rail.  Stair climbing will tire you more than you think, you may need to stop part way and rest.   2. No lifting or straining for 6 weeks over 10 pounds. No pushing, pulling, straining for 6 weeks.  3. No driving for 1 week(s).  Do not drive if you are taking narcotic pain medicine and make sure that your reaction time has returned.   4. You can shower as soon as the next day after surgery. Shower daily.  Use your regular soap and water (not directly on the incision) and pat your incision(s) dry afterwards; don't rub.  No tub baths or submerging your body in water until cleared by your surgeon. If you have the soap that was given to you by pre-surgical testing that was used before surgery, you do not need to use it afterwards because this can irritate your incisions.   5. No sexual activity and nothing in the vagina for 8 weeks.  6. You may experience a small amount of clear drainage from your incisions, which is normal.  If the drainage persists, increases, or changes color please call the office.  7. Do not use creams, lotions, or ointments such as neosporin on your incisions after surgery until advised by your surgeon because they can cause removal of the dermabond glue on your incisions.    8. You may experience vaginal spotting after surgery or around the 6-8 week mark from surgery when the stitches at the top of the vagina begin to dissolve.  The spotting is normal but if you experience heavy bleeding, call our office.  9. Take Tylenol or ibuprofen first for pain and only use narcotic pain medication for severe pain not relieved by the Tylenol or Ibuprofen.  Monitor your Tylenol intake to a max of 4,000 mg in a 24  hour period. You can alternate these medications after surgery.  Diet: 1. Low sodium Heart Healthy Diet is recommended but you are cleared to resume your normal (before surgery) diet after your procedure.  2. It is safe to use a laxative, such as Miralax or Colace, if you  have difficulty moving your bowels. You have been prescribed Sennakot at bedtime every evening to keep bowel movements regular and to prevent constipation.    Wound Care: 1. Keep clean and dry.  Shower daily.  Reasons to call the Doctor: Fever - Oral temperature greater than 100.4 degrees Fahrenheit Foul-smelling vaginal discharge Difficulty urinating Nausea and vomiting Increased pain at the site of the incision that is unrelieved with pain medicine. Difficulty breathing with or without chest pain New calf pain especially if only on one side Sudden, continuing increased vaginal bleeding with or without clots.   Contacts: For questions or concerns you should contact:  Dr. Everitt Amber at [phone]  Joylene John, NP at [phone]  After Hours: call [phone] and have the GYN Oncologist paged/contacted (after 5 pm or on the weekends).  Messages sent via mychart are for non-urgent matters and are not responded to after hours so for urgent needs, please call the after hours number.

## 2021-06-27 ENCOUNTER — Other Ambulatory Visit: Payer: Self-pay | Admitting: Internal Medicine

## 2021-06-27 NOTE — Progress Notes (Signed)
Sent message, via epic in basket, requesting orders in epic from surgeon.  

## 2021-06-28 ENCOUNTER — Other Ambulatory Visit: Payer: Self-pay | Admitting: Gynecologic Oncology

## 2021-06-28 DIAGNOSIS — C539 Malignant neoplasm of cervix uteri, unspecified: Secondary | ICD-10-CM

## 2021-06-28 NOTE — Progress Notes (Signed)
Patient here for new patient consultation with Dr. Everitt Amber for newly diagnosed cervical cancer and for a pre-operative discussion prior to her scheduled surgery on July 16, 2021. She is scheduled for robotic assisted laparoscopic radical hysterectomy, bilateral salpingo-oophorectomy, sentinel lymph node biopsy, possible lymph node dissection. The surgery was discussed in detail.  See after visit summary for additional details. Visual aids used to discuss items related to surgery including sequential compression stockings, foley catheter, IV pump, multi-modal pain regimen including tylenol, photo of the surgical robot, female reproductive system to discuss surgery in detail.      Discussed post-op pain management in detail including the aspects of the enhanced recovery pathway.  Advised her that a new prescription would be sent in for tramadol and it is only to be used for after her upcoming surgery.  We discussed the use of tylenol post-op and to monitor for a maximum of 4,000 mg in a 24 hour period.  Also prescribed sennakot to be used after surgery and to hold if having loose stools.  Discussed bowel regimen in detail.     Discussed the use of SCDs and measures to take at home to prevent DVT including frequent mobility.  Reportable signs and symptoms of DVT discussed. Post-operative instructions discussed and expectations for after surgery. Incisional care discussed as well including reportable signs and symptoms including erythema, drainage, wound separation.   Details about PET scan discussed. Foley catheter care discussed and visual demonstration given on foley bag emptying and use of the leg bag. The patient's friend who is a nurse is planning on staying with her after surgery and will be able to provide support.     10 minutes spent with the patient.  Verbalizing understanding of material discussed. No needs or concerns voiced at the end of the visit.   Advised patient to call for any needs.   Advised that her post-operative medications had been prescribed and could be picked up at any time.     This appointment is included in the global surgical bundle as pre-operative teaching and has no charge.

## 2021-06-28 NOTE — Patient Instructions (Signed)
Plan to have a PET scan to evaluate for spread of the cancer. If your insurance denies the PET scan, we will have to do a CT scan first.    Preparing for your Surgery   Plan for surgery on July 16, 2021 with Dr. Everitt Amber at [place] will be scheduled for a robotic assisted laparoscopic radical hysterectomy (removal of the uterus and cervix/surrounding tissue), bilateral salpingo-oophorectomy (removal of both ovaries and fallopian tubes), sentinel lymph node biopsy, possible lymph node dissection.    You will be discharged home with a foley catheter in place. This will stay in place for one week and we will remove it in the office at your postop check.    Pre-operative Testing -You will receive a phone call from presurgical testing at [facility] to arrange for a pre-operative appointment and lab work.   -Bring your insurance card, copy of an advanced directive if applicable, medication list   -At that visit, you will be asked to sign a consent for a possible blood transfusion in case a transfusion becomes necessary during surgery.  The need for a blood transfusion is rare but having consent is a necessary part of your care.      -You should not be taking blood thinners or aspirin at least ten days prior to surgery unless instructed by your surgeon. STOP TAKING YOUR ASPIRIN NOW.   -Do not take supplements such as fish oil (omega 3), red yeast rice, turmeric before your surgery. You want to avoid medications with aspirin in them including headache powders such as BC or Goody's), Excedrin migraine.   Day Before Surgery at [address] will be asked to take in a light diet the day before surgery. You will be advised you can have clear liquids up until 3 hours before your surgery.     Eat a light diet the day before surgery.  Examples including soups, broths, toast, yogurt, mashed potatoes.  AVOID GAS PRODUCING FOODS. Things to avoid include carbonated beverages (fizzy  beverages, sodas), raw fruits and raw vegetables (uncooked), or beans.    If your bowels are filled with gas, your surgeon will have difficulty visualizing your pelvic organs which increases your surgical risks.   Your role in recovery Your role is to become active as soon as directed by your doctor, while still giving yourself time to heal.  Rest when you feel tired. You will be asked to do the following in order to speed your recovery:   - Cough and breathe deeply. This helps to clear and expand your lungs and can prevent pneumonia after surgery.  - [place]. Do mild physical activity. Walking or moving your legs help your circulation and body functions return to normal. Do not try to get up or walk alone the first time after surgery.   -If you develop swelling on one leg or the other, pain in the back of your leg, redness/warmth in one of your legs, please call the office or go to the Emergency Room to have a doppler to rule out a blood clot. For shortness of breath, chest pain-seek care in the Emergency Room as soon as possible. - Actively manage your pain. Managing your pain lets you move in comfort. We will ask you to rate your pain on a scale of zero to 10. It is your responsibility to tell your doctor or nurse where and how much you hurt so your pain can be treated.  Special Considerations -If you are diabetic, you may be placed on insulin after surgery to have closer control over your blood sugars to promote healing and recovery.  This does not mean that you will be discharged on insulin.  If applicable, your oral antidiabetics will be resumed when you are tolerating a solid diet.   -Your final pathology results from surgery should be available around one week after surgery and the results will be relayed to you when available.   -Dr. Lahoma Crocker is the surgeon that assists your GYN Oncologist with surgery.  If you end up staying the night, the next day after  your surgery you will either see Dr. Denman George, Dr. Berline Lopes, or Dr. Lahoma Crocker.   -FMLA forms can be faxed to [phone] and please allow 5-7 business days for completion.   Pain Management After Surgery -You have been prescribed your pain medication and bowel regimen medications before surgery so that you can have these available when you are discharged from the hospital. The pain medication is for use ONLY AFTER surgery and a new prescription will not be given.    -Make sure that you have Tylenol and Ibuprofen at home to use on a regular basis after surgery for pain control. We recommend alternating the medications every hour to six hours since they work differently and are processed in the body differently for pain relief.   THERE CAN BE AN INCREASED RISK OF GASTROINTESTINAL BLEEDING WITH THE USE OF PROZAC AND IBUPROFEN. WOULD RECOMMEND USING THE IBUPROFEN SPARINGLY AND TAKE WITH FOOD.    -Review the attached handout on narcotic use and their risks and side effects.    Bowel Regimen -You have been prescribed Sennakot-S to take nightly to prevent constipation especially if you are taking the narcotic pain medication intermittently.  It is important to prevent constipation and drink adequate amounts of liquids. You can stop taking this medication when you are not taking pain medication and you are back on your normal bowel routine.   Risks of Surgery Risks of surgery are low but include bleeding, infection, damage to surrounding structures, re-operation, blood clots, and very rarely death.     Blood Transfusion Information (For the consent to be signed before surgery)   We will be checking your blood type before surgery so in case of emergencies, we will know what type of blood you would need.                                             WHAT IS A BLOOD TRANSFUSION?   A transfusion is the replacement of blood or some of its parts. Blood is made up of multiple cells which provide  different functions. Red blood cells carry oxygen and are used for blood loss replacement. White blood cells fight against infection. Platelets control bleeding. Plasma helps clot blood. Other blood products are available for specialized needs, such as hemophilia or other clotting disorders. BEFORE THE TRANSFUSION  Who gives blood for transfusions?  You may be able to donate blood to be used at a later date on yourself (autologous donation). Relatives can be asked to donate blood. This is generally not any safer than if you have received blood from a stranger. The same precautions are taken to ensure safety when a relative's blood is donated. Healthy volunteers who are fully evaluated to make sure their blood is  safe. This is blood bank blood. Transfusion therapy is the safest it has ever been in the practice of medicine. Before blood is taken from a donor, a complete history is taken to make sure that person has no history of diseases nor engages in risky social behavior (examples are intravenous drug use or sexual activity with multiple partners). The donor's travel history is screened to minimize risk of transmitting infections, such as malaria. The donated blood is tested for signs of infectious diseases, such as HIV and hepatitis. The blood is then tested to be sure it is compatible with you in order to minimize the chance of a transfusion reaction. If you or a relative donates blood, this is often done in anticipation of surgery and is not appropriate for emergency situations. It takes many days to process the donated blood. RISKS AND COMPLICATIONS Although transfusion therapy is very safe and saves many lives, the main dangers of transfusion include:  Getting an infectious disease. Developing a transfusion reaction. This is an allergic reaction to something in the blood you were given. Every precaution is taken to prevent this. The decision to have a blood transfusion has been considered  carefully by your caregiver before blood is given. Blood is not given unless the benefits outweigh the risks.   AFTER SURGERY INSTRUCTIONS   Return to work: 4 weeks if applicable   Activity: 1. Be up and out of the bed during the day.  Take a nap if needed.  You may walk up steps but be careful and use the hand rail.  Stair climbing will tire you more than you think, you may need to stop part way and rest.    2. No lifting or straining for 6 weeks over 10 pounds. No pushing, pulling, straining for 6 weeks.   3. No driving for 1 week(s).  Do not drive if you are taking narcotic pain medicine and make sure that your reaction time has returned.    4. You can shower as soon as the next day after surgery. Shower daily.  Use your regular soap and water (not directly on the incision) and pat your incision(s) dry afterwards; don't rub.  No tub baths or submerging your body in water until cleared by your surgeon. If you have the soap that was given to you by pre-surgical testing that was used before surgery, you do not need to use it afterwards because this can irritate your incisions.    5. No sexual activity and nothing in the vagina for 8 weeks.   6. You may experience a small amount of clear drainage from your incisions, which is normal.  If the drainage persists, increases, or changes color please call the office.   7. Do not use creams, lotions, or ointments such as neosporin on your incisions after surgery until advised by your surgeon because they can cause removal of the dermabond glue on your incisions.     8. You may experience vaginal spotting after surgery or around the 6-8 week mark from surgery when the stitches at the top of the vagina begin to dissolve.  The spotting is normal but if you experience heavy bleeding, call our office.   9. Take Tylenol or ibuprofen first for pain and only use narcotic pain medication for severe pain not relieved by the Tylenol or Ibuprofen.  Monitor your  Tylenol intake to a max of 4,000 mg in a 24 hour period. You can alternate these medications after surgery.   Diet:  1. Low sodium Heart Healthy Diet is recommended but you are cleared to resume your normal (before surgery) diet after your procedure.   2. It is safe to use a laxative, such as Miralax or Colace, if you have difficulty moving your bowels. You have been prescribed Sennakot at bedtime every evening to keep bowel movements regular and to prevent constipation.     Wound Care: 1. Keep clean and dry.  Shower daily.   Reasons to call the Doctor: Fever - Oral temperature greater than 100.4 degrees Fahrenheit Foul-smelling vaginal discharge Difficulty urinating Nausea and vomiting Increased pain at the site of the incision that is unrelieved with pain medicine. Difficulty breathing with or without chest pain New calf pain especially if only on one side Sudden, continuing increased vaginal bleeding with or without clots.   Contacts: For questions or concerns you should contact:   Dr. Everitt Amber at [phone]   Joylene John, NP at [phone]   After Hours: call [phone] and have the GYN Oncologist paged/contacted (after 5 pm or on the weekends).   Messages sent via mychart are for non-urgent matters and are not responded to after hours so for urgent needs, please call the after hours number.

## 2021-07-04 ENCOUNTER — Ambulatory Visit: Payer: 59 | Admitting: Gynecologic Oncology

## 2021-07-05 ENCOUNTER — Ambulatory Visit: Payer: 59 | Admitting: Gynecologic Oncology

## 2021-07-08 ENCOUNTER — Encounter: Payer: Self-pay | Admitting: Obstetrics and Gynecology

## 2021-07-09 ENCOUNTER — Encounter: Payer: Self-pay | Admitting: Gynecologic Oncology

## 2021-07-10 NOTE — Progress Notes (Addendum)
COVID swab appointment: 07-12-21  COVID Vaccine Completed: No Date COVID Vaccine completed: Has received booster: COVID vaccine manufacturer: [employer]   Date of COVID positive in last 90 days:  No  PCP - Tedra Senegal, MD  Cardiologist - Heather Pemberton,MD  Chest x-ray - N/A EKG - 09-12-20 Epic Stress Test - N/A ECHO - 10-25-20 Epic Cardiac Cath - N/A Pacemaker/ICD device last checked: Spinal Cord Stimulator: Telemetry Monitor - 2022 Epic CT cardiac score - 10-25-20 Epic  Sleep Study - N/A CPAP -   Fasting Blood Sugar - N/A Checks Blood Sugar _____ times a day  Blood Thinner Instructions:  N/A Aspirin Instructions: Last Dose:  Activity level:  Can go up a flight of stairs and perform activities of daily living without stopping and without symptoms of chest pain or shortness of breath.  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  Last syncopal episode a week and a half ago.  Patient stated that they seem to occur when she is stressed.  Patient denies shortness of breath, fever, cough and chest pain at PAT appointment   Patient verbalized understanding of instructions that were given to them at the PAT appointment. Patient was also instructed that they will need to review over the PAT instructions again at home before surgery.

## 2021-07-10 NOTE — Patient Instructions (Addendum)
DUE TO COVID-19 ONLY ONE VISITOR IS ALLOWED TO COME WITH YOU AND STAY IN THE WAITING ROOM ONLY DURING PRE OP AND PROCEDURE.   **NO VISITORS ARE ALLOWED IN THE SHORT STAY AREA OR RECOVERY ROOM!!**  IF YOU WILL BE ADMITTED INTO THE HOSPITAL YOU ARE ALLOWED ONLY TWO SUPPORT PEOPLE DURING VISITATION HOURS ONLY (10AM -8PM)   The support person(s) may change daily. The support person(s) must pass our screening, gel in and out, and wear a mask at all times, including in the patient's room. Patients must also wear a mask when staff or their support person are in the room.  No visitors under the age of 48. Any visitor under the age of 77 must be accompanied by an adult.   COVID SWAB TESTING MUST BE COMPLETED ON:  Friday 07-12-21 Between the hours of 8 and 3  **MUST PRESENT COMPLETED FORM AT TESTING SITE**    [facility] [place] (backside of the building)  You are not required to quarantine, however you are required to wear a well-fitted mask when you are out and around people not in your household.  Hand Hygiene often Do NOT share personal items Notify your provider if you are in close contact with someone who has COVID or you develop fever 100.4 or greater, new onset of sneezing, cough, sore throat, shortness of breath or body aches.        Your procedure is scheduled on: Tuesday, 07-16-21   Report to [facility] Main  Entrance   Report to Short Stay at 5:15 AM   [facility])   Call this number if you have problems the morning of surgery [phone]   Follow a light diet day before procedure (avoid gas producing foods)   Do not eat food :After Midnight.   May have liquids until 4:30 AM day of surgery  CLEAR LIQUID DIET  Foods Allowed                                                                     Foods Excluded  Water, Black Coffee (no milk/no creamer) and tea, regular and decaf                              liquids that you cannot  Plain Jell-O in any  flavor  (No red)                         see through such as: Fruit ices (not with fruit pulp)                                 milk, soups, orange juice  Iced Popsicles (No red)                                    All solid food                             Apple juices Sports drinks like Gatorade (  No red) Lightly seasoned clear broth or consume(fat free) Sugar      Oral Hygiene is also important to reduce your risk of infection.                                    Remember - BRUSH YOUR TEETH THE MORNING OF SURGERY WITH YOUR REGULAR TOOTHPASTE   Do NOT smoke after Midnight  Take these medicines the morning of surgery with A SIP OF WATER: Bupropion, Fluoxetine, Lamotrigine, Levothyroxine, Rosuvastatin.  Ondansetron if needed   Stop all vitamins and herbal supplements a week before surgery             You may not have any metal on your body including hair pins, jewelry, and body piercing             Do not wear make-up, lotions, powders, perfume or deodorant  Do not wear nail polish including gel and S&S, artificial/acrylic nails, or any other type of covering on natural nails including finger and toenails. If you have artificial nails, gel coating, etc. that needs to be removed by a nail salon please have this removed prior to surgery or surgery may need to be canceled/ delayed if the surgeon/ anesthesia feels like they are unable to be safely monitored.   Do not shave  48 hours prior to surgery.   Do not bring valuables to the hospital. [place].   Contacts, dentures or bridgework may not be worn into surgery.   Bring small overnight bag day of surgery.   Please read over the following fact sheets you were given: IF YOU HAVE QUESTIONS ABOUT YOUR PRE OP INSTRUCTIONS PLEASE CALL [facility] - Preparing for Surgery Before surgery, you can play an important role.  Because skin is not sterile, your skin needs to be as free of germs  as possible.  You can reduce the number of germs on your skin by washing with CHG (chlorahexidine gluconate) soap before surgery.  CHG is an antiseptic cleaner which kills germs and bonds with the skin to continue killing germs even after washing. Please DO NOT use if you have an allergy to CHG or antibacterial soaps.  If your skin becomes reddened/irritated stop using the CHG and inform your nurse when you arrive at Short Stay. Do not shave (including legs and underarms) for at least 48 hours prior to the first CHG shower.  You may shave your face/neck.  Please follow these instructions carefully:  1.  Shower with CHG Soap the night before surgery and the  morning of surgery.  2.  If you choose to wash your hair, wash your hair first as usual with your normal  shampoo.  3.  After you shampoo, rinse your hair and body thoroughly to remove the shampoo.                             4.  Use CHG as you would any other liquid soap.  You can apply chg directly to the skin and wash.  Gently with a scrungie or clean washcloth.  5.  Apply the CHG Soap to your body ONLY FROM THE NECK DOWN.   Do   not use on face/ open  Wound or open sores. Avoid contact with eyes, ears mouth and   genitals (private parts).                       Wash face,  Genitals (private parts) with your normal soap.             6.  Wash thoroughly, paying special attention to the area where your    surgery  will be performed.  7.  Thoroughly rinse your body with warm water from the neck down.  8.  DO NOT shower/wash with your normal soap after using and rinsing off the CHG Soap.                9.  Pat yourself dry with a clean towel.            10.  Wear clean pajamas.            11.  Place clean sheets on your bed the night of your first shower and do not  sleep with pets. Day of Surgery : Do not apply any lotions/deodorants the morning of surgery.  Please wear clean clothes to the hospital/surgery  center.  FAILURE TO FOLLOW THESE INSTRUCTIONS MAY RESULT IN THE CANCELLATION OF YOUR SURGERY  PATIENT SIGNATURE_________________________________  NURSE SIGNATURE__________________________________  ________________________________________________________________________  WHAT IS A BLOOD TRANSFUSION? Blood Transfusion Information  A transfusion is the replacement of blood or some of its parts. Blood is made up of multiple cells which provide different functions. Red blood cells carry oxygen and are used for blood loss replacement. White blood cells fight against infection. Platelets control bleeding. Plasma helps clot blood. Other blood products are available for specialized needs, such as hemophilia or other clotting disorders. BEFORE THE TRANSFUSION  Who gives blood for transfusions?  Healthy volunteers who are fully evaluated to make sure their blood is safe. This is blood bank blood. Transfusion therapy is the safest it has ever been in the practice of medicine. Before blood is taken from a donor, a complete history is taken to make sure that person has no history of diseases nor engages in risky social behavior (examples are intravenous drug use or sexual activity with multiple partners). The donor's travel history is screened to minimize risk of transmitting infections, such as malaria. The donated blood is tested for signs of infectious diseases, such as HIV and hepatitis. The blood is then tested to be sure it is compatible with you in order to minimize the chance of a transfusion reaction. If you or a relative donates blood, this is often done in anticipation of surgery and is not appropriate for emergency situations. It takes many days to process the donated blood. RISKS AND COMPLICATIONS Although transfusion therapy is very safe and saves many lives, the main dangers of transfusion include:  Getting an infectious disease. Developing a transfusion reaction. This is an allergic  reaction to something in the blood you were given. Every precaution is taken to prevent this. The decision to have a blood transfusion has been considered carefully by your caregiver before blood is given. Blood is not given unless the benefits outweigh the risks. AFTER THE TRANSFUSION Right after receiving a blood transfusion, you will usually feel much better and more energetic. This is especially true if your red blood cells have gotten low (anemic). The transfusion raises the level of the red blood cells which carry oxygen, and this usually causes an energy increase. The nurse administering the transfusion  will monitor you carefully for complications. HOME CARE INSTRUCTIONS  No special instructions are needed after a transfusion. You may find your energy is better. Speak with your caregiver about any limitations on activity for underlying diseases you may have. SEEK MEDICAL CARE IF:  Your condition is not improving after your transfusion. You develop redness or irritation at the intravenous (IV) site. SEEK IMMEDIATE MEDICAL CARE IF:  Any of the following symptoms occur over the next 12 hours: Shaking chills. You have a temperature by mouth above 102 F (38.9 C), not controlled by medicine. Chest, back, or muscle pain. People around you feel you are not acting correctly or are confused. Shortness of breath or difficulty breathing. Dizziness and fainting. You get a rash or develop hives. You have a decrease in urine output. Your urine turns a dark color or changes to pink, red, or brown. Any of the following symptoms occur over the next 10 days: You have a temperature by mouth above 102 F (38.9 C), not controlled by medicine. Shortness of breath. Weakness after normal activity. The white part of the eye turns yellow (jaundice). You have a decrease in the amount of urine or are urinating less often. Your urine turns a dark color or changes to pink, red, or brown. Document Released:  10/10/2000 Document Revised: 01/05/2012 Document Reviewed: 05/29/2008 [facility] Patient Information 2014 [place], [place].  _______________________________________________________________________

## 2021-07-11 ENCOUNTER — Encounter (HOSPITAL_COMMUNITY)
Admission: RE | Admit: 2021-07-11 | Discharge: 2021-07-11 | Disposition: A | Discharge status: Home / Self Care | Payer: 59 | Source: Ambulatory Visit | Attending: Gynecologic Oncology | Admitting: Gynecologic Oncology

## 2021-07-11 ENCOUNTER — Encounter (HOSPITAL_COMMUNITY): Payer: Self-pay

## 2021-07-11 ENCOUNTER — Other Ambulatory Visit: Payer: Self-pay

## 2021-07-11 DIAGNOSIS — Z01812 Encounter for preprocedural laboratory examination: Secondary | ICD-10-CM | POA: Diagnosis present

## 2021-07-11 DIAGNOSIS — C539 Malignant neoplasm of cervix uteri, unspecified: Secondary | ICD-10-CM | POA: Insufficient documentation

## 2021-07-11 HISTORY — DX: Unspecified osteoarthritis, unspecified site: M19.90

## 2021-07-11 HISTORY — DX: Malignant neoplasm of cervix uteri, unspecified: C53.9

## 2021-07-11 LAB — CBC
HCT: 42.3 % (ref 36.0–46.0)
Hemoglobin: 14 g/dL (ref 12.0–15.0)
MCH: 32.8 pg (ref 26.0–34.0)
MCHC: 33.1 g/dL (ref 30.0–36.0)
MCV: 99.1 fL (ref 80.0–100.0)
Platelets: 266 10*3/uL (ref 150–400)
RBC: 4.27 MIL/uL (ref 3.87–5.11)
RDW: 12.1 % (ref 11.5–15.5)
WBC: 7.7 10*3/uL (ref 4.0–10.5)
nRBC: 0 % (ref 0.0–0.2)

## 2021-07-11 LAB — BASIC METABOLIC PANEL
Anion gap: 7 (ref 5–15)
BUN: 14 mg/dL (ref 6–20)
CO2: 29 mmol/L (ref 22–32)
Calcium: 9.6 mg/dL (ref 8.9–10.3)
Chloride: 102 mmol/L (ref 98–111)
Creatinine, Ser: 0.71 mg/dL (ref 0.44–1.00)
GFR, Estimated: >60 mL/min (ref >60)
Glucose, Bld: 104 mg/dL — ABNORMAL HIGH (ref 70–99)
Potassium: 4.2 mmol/L (ref 3.5–5.1)
Sodium: 138 mmol/L (ref 135–145)

## 2021-07-11 LAB — URINALYSIS, ROUTINE W REFLEX MICROSCOPIC
Bacteria, UA: NONE SEEN
Bilirubin Urine: NEGATIVE
Glucose, UA: NEGATIVE mg/dL
Ketones, ur: NEGATIVE mg/dL
Leukocytes,Ua: NEGATIVE
Nitrite: NEGATIVE
Protein, ur: 30 mg/dL — AB
Specific Gravity, Urine: 1.003 — ABNORMAL LOW (ref 1.005–1.030)
pH: 6 (ref 5.0–8.0)

## 2021-07-11 LAB — GLUCOSE, CAPILLARY: Glucose-Capillary: 107 mg/dL — ABNORMAL HIGH (ref 70–99)

## 2021-07-11 MED ORDER — FLUDEOXYGLUCOSE F - 18 (FDG) INJECTION
8.1000 | Freq: Once | INTRAVENOUS | Status: AC
  Administered 2021-07-11: 8.1 via INTRAVENOUS

## 2021-07-12 ENCOUNTER — Other Ambulatory Visit: Payer: Self-pay | Admitting: Gynecologic Oncology

## 2021-07-12 ENCOUNTER — Encounter (HOSPITAL_COMMUNITY): Payer: Self-pay | Admitting: Physician Assistant

## 2021-07-12 ENCOUNTER — Telehealth: Payer: Self-pay | Admitting: *Deleted

## 2021-07-12 NOTE — Telephone Encounter (Signed)
I spoke with Ashley Hammond regarding the results of her UA and PET scan. Regarding the UA, she is having vaginal bleeding, but she denies dysuria, hematuria or s/s of UTI.   She was happy to hear hear that there are no obvious signs of cancer spread on the PET scan.   We discussed the nodule on her R lung which is stable, could be a lymph node and will be followed with future imaging.  Regarding the area under the tongue and tonsils and one neck lymph node, she reports allergies and constant sinus congestion. She reports that she had her tonsils removed in the past. She had her preop COVID test today.   When talking about the area of her R anterior 6th rib, she reports that she fell recently and hit the side of a table, although painful at the time in the area underneath her R Breast, it is no longer is an issue for her.

## 2021-07-13 LAB — SARS CORONAVIRUS 2 (TAT 6-24 HRS): SARS Coronavirus 2: NEGATIVE

## 2021-07-15 ENCOUNTER — Telehealth: Payer: Self-pay | Admitting: Gynecologic Oncology

## 2021-07-15 ENCOUNTER — Telehealth: Payer: Self-pay

## 2021-07-15 ENCOUNTER — Encounter: Payer: Self-pay | Admitting: Oncology

## 2021-07-15 DIAGNOSIS — C539 Malignant neoplasm of cervix uteri, unspecified: Secondary | ICD-10-CM

## 2021-07-15 NOTE — Telephone Encounter (Signed)
Left message requesting return call

## 2021-07-15 NOTE — Telephone Encounter (Signed)
Called patient and informed her that the tumor was measuring >4cm on PET imaging (though localized to the cervix).  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  Therefore my recommendation is to cancel the planned radical hysterectomy and proceed with definitive chemoradiation. I explained that we could consider interval extrafascial hysterectomy 6 weeks after completion of brachytherapy for local control. However, definitive chemoradiation alone without hysterectomy is also acceptable.  I will bring her back to the office tomorrow to discuss further.  Thereasa Solo, MD

## 2021-07-15 NOTE — Telephone Encounter (Signed)
Telephone call to check on pre-operative status. Patient compliant with pre-operative instructions.  Reinforced NPO after midnight. Patient questioning if she can take xanax the morning of surgery. Per Ashley Hammond in Pre-admission testing it is ok to take. No other questions or concerns voiced.  Instructed to call for any needs.

## 2021-07-16 ENCOUNTER — Inpatient Hospital Stay (HOSPITAL_BASED_OUTPATIENT_CLINIC_OR_DEPARTMENT_OTHER): Payer: 59 | Admitting: Gynecologic Oncology

## 2021-07-16 ENCOUNTER — Ambulatory Visit (HOSPITAL_COMMUNITY): Admission: RE | Admit: 2021-07-16 | Payer: 59 | Source: Home / Self Care | Admitting: Gynecologic Oncology

## 2021-07-16 ENCOUNTER — Encounter (HOSPITAL_COMMUNITY): Admission: RE | Payer: Self-pay | Source: Home / Self Care

## 2021-07-16 ENCOUNTER — Other Ambulatory Visit: Payer: Self-pay

## 2021-07-16 VITALS — BP 112/65 | HR 96 | Temp 98.4°F | Resp 16 | Ht 69.0 in | Wt 166.6 lb

## 2021-07-16 DIAGNOSIS — Z79899 Other long term (current) drug therapy: Secondary | ICD-10-CM | POA: Insufficient documentation

## 2021-07-16 DIAGNOSIS — E039 Hypothyroidism, unspecified: Secondary | ICD-10-CM | POA: Insufficient documentation

## 2021-07-16 DIAGNOSIS — Z87891 Personal history of nicotine dependence: Secondary | ICD-10-CM | POA: Insufficient documentation

## 2021-07-16 DIAGNOSIS — C539 Malignant neoplasm of cervix uteri, unspecified: Secondary | ICD-10-CM | POA: Insufficient documentation

## 2021-07-16 DIAGNOSIS — E7800 Pure hypercholesterolemia, unspecified: Secondary | ICD-10-CM | POA: Insufficient documentation

## 2021-07-16 LAB — TYPE AND SCREEN
ABO/RH(D): O POS
Antibody Screen: NEGATIVE

## 2021-07-16 SURGERY — HYSTERECTOMY, TOTAL, ROBOT-ASSISTED, LAPAROSCOPIC, WITH BILATERAL SALPINGO-OOPHORECTOMY
Anesthesia: General

## 2021-07-16 NOTE — Progress Notes (Signed)
Progress Note: Gyn-Onc  Consult was requested by Dr. Talbert Nan for the evaluation of THERMA LASURE 55 y.o. female  CC:  Chief Complaint  Patient presents with   Cervical Cancer    Assessment/Plan:  Ashley Hammond  is a 55 y.o.  year old with stage IB3 cervical squamous cell carcinoma.   Recommended treatment is primary chemoradiation followed by interval extrafascial hysterectomy at 6 weeks post treatment, followed by PET scan at 3 months post treatment.  I spent 40 minutes with the patient and her friend explaining prognosis, treatment course and anticipated toxicities.  She was in agreement with the plan.         HPI: [name] is a 55 year old P2 who was seen in consultation at the request of Dr Talbert Nan for evaluation of cervical cancer.  The patient reported a history of light vaginal bleeding since approximately May 2022.  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  Due to the abnormal bleeding that she was receiving she was then recommended to follow-up with her gynecologist, Dr. Talbert Nan, who first evaluated the endometrium with an endometrial biopsy performed on 05/20/2021 which revealed benign inactive atrophic endometrium negative for hyperplasia or malignancy.  The bleeding continued and the patient returned for a colposcopic evaluation of the cervix with biopsies (due to the presence of high risk HPV).  This took place on 06/17/2021.  At the time of that colposcopic evaluation there was some abnormal bleeding noted and friability to the cervix.  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  Interval Hx:  After physical examination she was presumed to have stage IB1 cervical cancer and  was scheduled for a PET and radical hysterectomy. PET on 07/11/21 showed that the cervix was barreled and measured >4.3cm in greatest dimension, consistent with a stage IB3 lesions.  Given that postoperative radiation would be almost certainly recommended for this bulky tumor (and associated with a high rate of complication after radical hysterectomy, but without improved survival) she was recommended to receive definitive chemoradiation followed by consideration for interval extrafascial hysterectomy/BSO at 6 weeks post radiation; followed by PET at 3 months post treatment.    Current Meds:  Outpatient Encounter Medications as of 07/16/2021  Medication Sig   ALPRAZolam (XANAX) 0.5 MG tablet Take 1 tablet (0.5 mg total) by mouth at bedtime as needed for anxiety.   Apoaequorin (PREVAGEN EXTRA STRENGTH) 20 MG CAPS Take 1 capsule by mouth daily.   aspirin EC 81 MG tablet Take 1 tablet (81 mg total) by mouth daily. Swallow whole. (Patient not taking: No sig reported)   Bioflavonoid Products (ESTER-C) 500-550 MG TABS Take by mouth.   Brimonidine Tartrate (LUMIFY) 0.025 % SOLN Place 1 drop into both eyes daily as needed (Red eye).   buPROPion (WELLBUTRIN XL) 300 MG 24 hr tablet TAKE ONE TABLET BY MOUTH DAILY   FLUoxetine (PROZAC) 10 MG capsule TAKE ONE CAPSULE BY MOUTH DAILY   ibuprofen (ADVIL) 800 MG tablet Take 1 tablet (800 mg total) by mouth every 8 (eight) hours as needed for moderate pain. For AFTER surgery only   lamoTRIgine (LAMICTAL) 25 MG tablet Take 25 mg by mouth daily.   levothyroxine (SYNTHROID) 50 MCG tablet TAKE ONE  TABLET BY MOUTH DAILY ON AN EMPTY STOMACH 30 MINUTES BEFORE BREAKFAST   Magnesium 400 MG CAPS Take 400 mg by mouth daily.   meloxicam (MOBIC) 15 MG tablet Take 15 mg by mouth daily as needed for pain.   olopatadine (PATADAY) 0.1 % ophthalmic solution Place 1 drop into both eyes daily as needed for allergies.   ondansetron (ZOFRAN) 4 MG tablet Take 4 mg by mouth every 8  (eight) hours as needed for nausea or vomiting.   OVER THE COUNTER MEDICATION Take 1 capsule by mouth daily. Mushroom 7   rosuvastatin (CRESTOR) 10 MG tablet Take 10 mg by mouth daily.   senna-docusate (SENOKOT-S) 8.6-50 MG tablet Take 2 tablets by mouth at bedtime. For AFTER surgery, do not take if having diarrhea   temazepam (RESTORIL) 15 MG capsule Take 15-30 mg by mouth at bedtime as needed for sleep.   traMADol (ULTRAM) 50 MG tablet Take 1 tablet (50 mg total) by mouth every 6 (six) hours as needed for severe pain. For AFTER surgery only, do not take and drive   zinc gluconate 50 MG tablet Take 50 mg by mouth daily.   zolpidem (AMBIEN) 10 MG tablet TAKE ONE TABLET BY MOUTH EVERY NIGHT AT BEDTIME AS NEEDED (Patient not taking: No sig reported)   No facility-administered encounter medications on file as of 07/16/2021.    Allergy: No Known Allergies  Social Hx:   Social History   Socioeconomic History   Marital status: Married    Spouse name: Not on file   Number of children: Not on file   Years of education: Not on file   Highest education level: Not on file  Occupational History   Not on file  Tobacco Use   Smoking status: Former    Packs/day: 0.25    Years: 25.00    Pack years: 6.25    Types: Cigarettes    Quit date: 2016    Years since quitting: 6.7   Smokeless tobacco: Never   Tobacco comments:    20+ yrs  Vaping Use   Vaping Use: Never used  Substance and Sexual Activity   Alcohol use: Not Currently    Comment: 14 drinks a week of wine   Drug use: Yes    Types: Marijuana    Comment: Last use 07-10-21   Sexual activity: Not Currently    Birth control/protection: Post-menopausal  Other Topics Concern   Not on file  Social History Narrative   Not on file   Social Determinants of Health   Financial Resource Strain: Not on file  Food Insecurity: Not on file  Transportation Needs: Not on file  Physical Activity: Not on file  Stress: Not on file  Social  Connections: Not on file  Intimate Partner Violence: Not on file    Past Surgical Hx:  Past Surgical History:  Procedure Laterality Date   ANKLE SURGERY Right    [facility]   WISDOM TOOTH EXTRACTION      Past Medical Hx:  Past Medical History:  Diagnosis Date   Anxiety    Arthritis    Arthritis of ankle    Right   Cervical cancer ([place])    Depression    Episode of loss of consciousness    Hypothyroidism    Insomnia    Pure hypercholesterolemia     Past Gynecological History:  See HPI, SVD x 2. Infrequent paps and gynecologic care in decade preceding her cancer diagnosis. HPV positive. No LMP recorded. Patient is  postmenopausal.  Family Hx:  Family History  Problem Relation Age of Onset   Heart attack Father    Diabetes Brother    Pancreatic cancer Maternal Grandmother    Ovarian cancer Other    Endometrial cancer Other    Colon cancer Other    Prostate cancer Other    Pancreatic cancer Other    Sudden death Neg Hx    Hyperlipidemia Neg Hx    Hypertension Neg Hx    Breast cancer Neg Hx     Review of Systems:  Constitutional  Feels well,    ENT Normal appearing ears and nares bilaterally Skin/Breast  No rash, sores, jaundice, itching, dryness Cardiovascular  No chest pain, shortness of breath, or edema  Pulmonary  No cough or wheeze.  Gastro Intestinal  No nausea, vomitting, or diarrhoea. No bright red blood per rectum, no abdominal pain, change in bowel movement, or constipation.  Genito Urinary  No frequency, urgency, dysuria, + abnormal bleeding.  Musculo Skeletal  No myalgia, arthralgia, joint swelling or pain  Neurologic  No weakness, numbness, change in gait,  Psychology  No depression, anxiety, insomnia.   Vitals:  Blood pressure 112/65, pulse 96, temperature 98.4 F (36.9 C), temperature source Oral, resp. rate 16, height 5\' 9"  (1.753 m), weight 166 lb 9.6 oz (75.6 kg), SpO2 100 %.  Physical Exam: WD in NAD Neck  Supple NROM, without any  enlargements.  Lymph Node Survey No cervical supraclavicular or inguinal adenopathy Cardiovascular  Well perfused peripheries Lungs  Non increased WOB Skin  No rash/lesions/breakdown  Psychiatry  Alert and oriented to person, place, and time  Abdomen  Normoactive bowel sounds, abdomen soft, non-tender and nonobese without evidence of hernia.  Back No CVA tenderness Genito Urinary  Vulva/vagina: Normal external female genitalia.   No lesions. No discharge or bleeding.  Bladder/urethra:  No lesions or masses, well supported bladder  Vagina: grossly visibly and palpably normal  Cervix: Central 1.5-2cm lesion on anterior inner cervical os. Ectocervix smooth and grossly normal. Cervix feels hard, but not abnormal in size dimensions. No palpable parametrial extension.   Uterus:  Small, mobile, no parametrial involvement or nodularity.  Adnexa: no palpable masses. Rectal  Good tone, no masses no cul de sac nodularity, no palpable parametrial disease.   Extremities  No bilateral cyanosis, clubbing or edema.   Thereasa Solo, MD  07/16/2021, 4:07 PM

## 2021-07-16 NOTE — Patient Instructions (Signed)
Dr Denman George is recommending chemotherapy and radiation for your stage IB3 cervical cancer.  Dr Denman George will discuss with Dr Berline Lopes the possibility of interval hysterectomy 6 weeks after radiation (if you have had a good response to therapy).

## 2021-07-17 NOTE — Progress Notes (Signed)
Met with Tiphanie and her friend after visit with Dr. Denman George.  Explained my role as nurse navigator and discussed plan of treatment with concurrent chemotherapy and radiation.  Provided her with the [facility] [facility] and went over upcoming appointments.  Encouraged her to call with and questions or needs.  She also asked [place] to complete a cancer policy form.  Advised her that we will let her know when it is completed.  Oncology Nurse Navigator Documentation  Oncology Nurse Navigator Flowsheets 07/17/2021  Planned Course of Treatment Chemo/Radiation Concurrent  Navigator Location [facility]  Navigator Encounter Type Follow-up Appt  Barriers/Navigation Needs Coordination of Care;Education;Financial Toxicity  International aid/development worker for English as a second language teacher;Disability/FMLA;Referrals  Acuity Level 3-Moderate Needs (3-4 Barriers Identified)  Referrals Medical Oncology;Radiation Oncology  Coordination of Care Appts  Time Spent with Patient 30

## 2021-07-17 NOTE — Progress Notes (Signed)
GYN Location of Tumor / Histology: Cervical  Ashley Hammond presented with symptoms of: history of light vaginal bleeding since approximately May 2022.  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  Biopsies revealed:  06/17/2021   Past/Anticipated interventions by Gyn/Onc surgery, if any: Dr Denman George   Past/Anticipated interventions by medical oncology, if any: Dr Denman George recommended to receive definitive chemoradiation followed by consideration for interval extrafascial hysterectomy/BSO at 6 weeks post radiation; followed by PET at 3 months post treatment.    Weight changes, if any: no  Bowel/Bladder complaints, if any: Yes.  ,  occasional diarrhea and constipation, Nocturia x 0-1  Nausea/Vomiting, if any: no  Pain issues, if any:  no  SAFETY ISSUES: Prior radiation? no Pacemaker/ICD? no Possible current pregnancy? no, postmenopausal Is the patient on methotrexate? no  Current Complaints / other details:  episodes of blacking out. She notices a smell prior to blacking out.  Vitals:   07/19/21 0842  BP: 108/71  Pulse: 80  Resp: 18  Temp: 97.8 F (36.6 C)  SpO2: 100%  Weight: 169 lb 9.6 oz (76.9 kg)  Height: 5\' 9"  (1.753 m)

## 2021-07-18 NOTE — Progress Notes (Signed)
Radiation Oncology         [phone] ________________________________  Initial Outpatient Consultation  Name: Ashley Hammond MRN: [id number]  Date: 07/19/2021  DOB: [birth year]  Ashley Hammond, Cresenciano Lick, MD  Ashley Amber, MD   REFERRING PHYSICIAN: Everitt Amber, MD  DIAGNOSIS: The encounter diagnosis was Malignant neoplasm of cervix, unspecified site Ashley Hammond).  Squamous cell carcinoma of the cervix, Clinical Stage IB3  HISTORY OF PRESENT ILLNESS::Ashley Hammond is a 55 y.o. female who is accompanied by good friend. she is seen as a courtesy of Ashley Hammond for an opinion concerning radiation therapy as part of management for her recently diagnosed cervical cancer. The patient presented to her PCP this past July with symptoms of light vaginal bleeding since this past May, which became heavier in July. Pap smear performed by her PCP on 05/09/21 demonstrated high-risk HPV but was otherwise normal.  Due to the patients presenting symptoms of abnormal bleeding, the patient was referred for follow-up with her gynecologist, Ashley Hammond. Endometrial biopsy performed on 05/20/21 revealed benign inactive atrophic endometrium negative for hyperplasia or malignancy.  Transvaginal ultrasound performed on 05/30/21 demonstrated the uterus to appear small and anteverted, and a thin appearing endometrium. Ovaries appeared normal bilaterally.   The patient continued to experience bleeding which prompted biopsies of the cervix and endocervix on 06/17/21 . Pathology revealed invasive squamous cell carcinoma of the cervix, and squamous cell carcinoma of the endocervix.  Following these findings, the patient met with Ashley Hammond on 06/26/21 to discuss surgical interventions. Following discussion, the patient agreed to proceed with the plan of radical hysterectomy and bilateral salpingectomy with SLN biopsy.  However, PET  performed on 07/11/21 demonstrated the cervix to appear barreled; measuring > 4.3 cm in the  greatest dimension. Findings were consistent with a stage IB3 lesion. Given the staging, Ashley Hammond canceled the planned hysterectomy and BSO and reccommended the patient to proceed with a course of definitive chemoradiation.  Recommendations were also to consider extrafascial hysterectomy after one brachytherapy procedure and external beam radiation therapy.  PREVIOUS RADIATION THERAPY: No  PAST MEDICAL HISTORY:  Past Medical History:  Diagnosis Date   Anxiety    Arthritis    Arthritis of ankle    Right   Cervical cancer ([place])    Depression    Episode of loss of consciousness    Hypothyroidism    Insomnia    Pure hypercholesterolemia     PAST SURGICAL HISTORY: Past Surgical History:  Procedure Laterality Date   ANKLE SURGERY Right    [facility]   WISDOM TOOTH EXTRACTION      FAMILY HISTORY:  Family History  Problem Relation Age of Onset   Heart attack Father    Diabetes Brother    Pancreatic cancer Maternal Grandmother    Ovarian cancer Other    Endometrial cancer Other    Colon cancer Other    Prostate cancer Other    Pancreatic cancer Other    Sudden death Neg Hx    Hyperlipidemia Neg Hx    Hypertension Neg Hx    Breast cancer Neg Hx     SOCIAL HISTORY:  Social History   Tobacco Use   Smoking status: Former    Packs/day: 0.25    Years: 25.00    Pack years: 6.25    Types: Cigarettes    Quit date: 2016    Years since quitting: 6.7   Smokeless tobacco: Never   Tobacco comments:    20+ yrs  Vaping Use  Vaping Use: Never used  Substance Use Topics   Alcohol use: Not Currently    Comment: 14 drinks a week of wine   Drug use: Yes    Types: Marijuana    Comment: Last use 07-10-21    ALLERGIES: No Known Allergies  MEDICATIONS:  Current Outpatient Medications  Medication Sig Dispense Refill   ALPRAZolam (XANAX) 0.5 MG tablet Take 1 tablet (0.5 mg total) by mouth at bedtime as needed for anxiety. 90 tablet 1   Apoaequorin (PREVAGEN EXTRA STRENGTH) 20 MG CAPS  Take 1 capsule by mouth daily.     Bioflavonoid Products (ESTER-C) 500-550 MG TABS Take by mouth.     Brimonidine Tartrate (LUMIFY) 0.025 % SOLN Place 1 drop into both eyes daily as needed (Red eye).     buPROPion (WELLBUTRIN XL) 300 MG 24 hr tablet TAKE ONE TABLET BY MOUTH DAILY 90 tablet 1   FLUoxetine (PROZAC) 10 MG capsule TAKE ONE CAPSULE BY MOUTH DAILY 90 capsule 0   ibuprofen (ADVIL) 800 MG tablet Take 1 tablet (800 mg total) by mouth every 8 (eight) hours as needed for moderate pain. For AFTER surgery only 30 tablet 0   lamoTRIgine (LAMICTAL) 25 MG tablet Take 25 mg by mouth daily.     levothyroxine (SYNTHROID) 50 MCG tablet TAKE ONE TABLET BY MOUTH DAILY ON AN EMPTY STOMACH 30 MINUTES BEFORE BREAKFAST 90 tablet 0   Magnesium 400 MG CAPS Take 400 mg by mouth daily.     olopatadine (PATANOL) 0.1 % ophthalmic solution Place 1 drop into both eyes daily as needed for allergies.     ondansetron (ZOFRAN) 4 MG tablet Take 4 mg by mouth every 8 (eight) hours as needed for nausea or vomiting.     OVER THE COUNTER MEDICATION Take 1 capsule by mouth daily. Mushroom 7     rosuvastatin (CRESTOR) 10 MG tablet Take 10 mg by mouth daily.     zinc gluconate 50 MG tablet Take 50 mg by mouth daily.     zolpidem (AMBIEN) 10 MG tablet TAKE ONE TABLET BY MOUTH EVERY NIGHT AT BEDTIME AS NEEDED 30 tablet 2   aspirin EC 81 MG tablet Take 1 tablet (81 mg total) by mouth daily. Swallow whole. (Patient not taking: No sig reported) 90 tablet 3   meloxicam (MOBIC) 15 MG tablet Take 15 mg by mouth daily as needed for pain. (Patient not taking: Reported on 07/19/2021)     senna-docusate (SENOKOT-S) 8.6-50 MG tablet Take 2 tablets by mouth at bedtime. For AFTER surgery, do not take if having diarrhea (Patient not taking: Reported on 07/19/2021) 30 tablet 0   temazepam (RESTORIL) 15 MG capsule Take 15-30 mg by mouth at bedtime as needed for sleep. (Patient not taking: Reported on 07/19/2021)     traMADol (ULTRAM) 50 MG tablet  Take 1 tablet (50 mg total) by mouth every 6 (six) hours as needed for severe pain. For AFTER surgery only, do not take and drive (Patient not taking: Reported on 07/19/2021) 10 tablet 0   No current facility-administered medications for this encounter.    REVIEW OF SYSTEMS:  A 10+ POINT REVIEW OF SYSTEMS WAS OBTAINED including neurology, dermatology, psychiatry, cardiac, respiratory, lymph, extremities, GI, GU, musculoskeletal, constitutional, reproductive, HEENT.  Patient continues to have vaginal bleeding.  This seems to be more significant recently but is somewhat intermittent.  She denies any pelvic pain or low back pain.  She denies any flank pain difficulties with urination or bowel movements.  PHYSICAL EXAM:  height is '5\' 9"'  (1.753 m) and weight is 169 lb 9.6 oz (76.9 kg). Her temperature is 97.8 F (36.6 C). Her blood pressure is 108/71 and her pulse is 80. Her respiration is 18 and oxygen saturation is 100%.   General: Alert and oriented, in no acute distress HEENT: Head is normocephalic. Extraocular movements are intact.  Neck: Neck is supple, no palpable cervical or supraclavicular lymphadenopathy. Heart: Regular in rate and rhythm with no murmurs, rubs, or gallops. Chest: Clear to auscultation bilaterally, with no rhonchi, wheezes, or rales. Abdomen: Soft, nontender, nondistended, with no rigidity or guarding. Extremities: No cyanosis or edema. Lymphatics: see Neck Exam Skin: No concerning lesions. Musculoskeletal: symmetric strength and muscle tone throughout. Neurologic: Cranial nerves II through XII are grossly intact. No obvious focalities. Speech is fluent. Coordination is intact. Psychiatric: Judgment and insight are intact. Affect is appropriate. On pelvic examination the external genitalia were unremarkable. A speculum exam was performed. There are no mucosal lesions noted in the vaginal vault.  View of the cervix is obtained showing changes along the anterior cervix  suspicious for malignancy.  On bimanual examination and rectovaginal examination the cervix is quite firm and estimated to at least 4 cm in greatest dimension.  No obvious parametrial extension noted on rectovaginal examination.  Rectal sphincter tone is normal.  ECOG = 1  0 - Asymptomatic (Fully active, able to carry on all predisease activities without restriction)  1 - Symptomatic but completely ambulatory (Restricted in physically strenuous activity but ambulatory and able to carry out work of a light or sedentary nature. For example, light housework, office work)  2 - Symptomatic, <50% in bed during the day (Ambulatory and capable of all self care but unable to carry out any work activities. Up and about more than 50% of waking hours)  3 - Symptomatic, >50% in bed, but not bedbound (Capable of only limited self-care, confined to bed or chair 50% or more of waking hours)  4 - Bedbound (Completely disabled. Cannot carry on any self-care. Totally confined to bed or chair)  5 - Death   Eustace Pen MM, Creech RH, Tormey DC, et al. [phone]). "Toxicity and response criteria of the [facility]". Bonnie Oncol. 5 (6): 649-55  LABORATORY DATA:  Lab Results  Component Value Date   WBC 7.7 07/11/2021   HGB 14.0 07/11/2021   HCT 42.3 07/11/2021   MCV 99.1 07/11/2021   PLT 266 07/11/2021   NEUTROABS 2,922 05/07/2021   Lab Results  Component Value Date   NA 138 07/11/2021   K 4.2 07/11/2021   CL 102 07/11/2021   CO2 29 07/11/2021   GLUCOSE 104 (H) 07/11/2021   CREATININE 0.71 07/11/2021   CALCIUM 9.6 07/11/2021      RADIOGRAPHY: NM PET Image Initial (PI) Skull Base To Thigh (F-18 FDG)  Result Date: 07/12/2021 CLINICAL DATA:  Initial treatment strategy for uterine/cervical cancer. EXAM: NUCLEAR MEDICINE PET SKULL BASE TO THIGH TECHNIQUE: 8.1 mCi F-18 FDG was injected intravenously. Full-ring PET imaging was performed from the skull base to thigh after the  radiotracer. CT data was obtained and used for attenuation correction and anatomic localization. Fasting blood glucose: 107 mg/dl COMPARISON:  CT October 25, 2020. FINDINGS: Mediastinal blood pool activity: SUV max 2.47 Liver activity: SUV max NA NECK: Hypermetabolic 5 mm left level 1b lymph node on CT image 42/4 with a max SUV of 3.39. Mild the slightly asymmetric to the right hypermetabolic hyperplasia of the  tonsils, favored reactive. Nodular focus of hypermetabolic activity with a max SUV of 5.48 along the right anterior sublingual region on axial fused image 197. Incidental CT findings: Dental hardware. CHEST: No hypermetabolic mediastinal or hilar nodes. Peri-fissural 4-5 mm right middle lobe pulmonary nodule on image 46/8 unchanged in size from CT October 25, 2020 which is below the resolution of PET-CT, nonspecific but favored represent a peri-fissural lymph node no additional suspicious pulmonary nodules or masses. Incidental CT findings: No focal airspace consolidation. Normal caliber aorta and central pulmonary arteries. Normal size heart. No significant pericardial effusion/thickening. ABDOMEN/PELVIS: Intense hypermetabolic activity associated with ill-defined nodular enlargement of the cervix/left lower uterine segment with a max SUV of 18.08. No abnormal hypermetabolic activity within the liver, pancreas, adrenal glands or spleen. No hypermetabolic adenopathy in the abdomen or pelvis. Incidental CT findings: Unremarkable noncontrast appearance of the liver, pancreas, spleen and kidneys. No evidence of bowel obstruction. Sigmoid colonic diverticulosis without findings of acute diverticulitis. SKELETON: Hypermetabolic activity associated with callus formation in the right anterior sixth rib without associated aggressive lytic or blastic lesion of bone on image 109/4, likely reflecting a healing nonpathologic rib fracture. No definite abnormal hypermetabolic activity to suggest skeletal metastasis.  Incidental CT findings: none IMPRESSION: Intense hypermetabolic activity associated with ill-defined nodular enlargement of the cervix/lower uterine segment, consistent with patient's biopsy proven cervical neoplasm. No definite scintigraphic evidence of metastatic disease in the neck, chest, abdomen or pelvis. Right middle lobe 4-5 mm perifissural pulmonary nodule unchanged in size from October 25, 2020 and below the resolution of PET-CT but favored to reflect a peri- fissural lymph node, attention on follow-up studies suggested Nodular focus of hypermetabolic activity along the right anterior sublingual region, which is nonspecific correlation with direct visualization is suggested. Hypermetabolic hyperplasia of the tonsils with a hypermetabolic 5 mm left level 1b cervical lymph node, favored reactive. Hypermetabolic activity associated with callus formation in the right anterior sixth rib likely representing a healing nonpathologic rib fracture. Electronically Signed   By: Dahlia Bailiff M.D.   On: 07/12/2021 08:51      IMPRESSION: Squamous cell carcinoma of the cervix, clinical stage IB-3  As above recent PET scan shows the tumor to be greater than 4 cm in size.  The patient therefore was not felt to be a candidate for radical hysterectomy given the tumor size.  She would however be a good candidate for definitive course of radiation therapy along with radiosensitizing chemotherapy.  Radiation would include 5 weeks of external beam radiation therapy directed to the pelvis followed by 5 intracavitary high-dose-rate treatments using iridium 192 directed at the cervical region.  In addition one option may be to consider limited brachytherapy with one application and consider extrafascial hysterectomy at that time.  Today, I talked to the patient and in good friend about the findings and work-up thus far.  We discussed the natural history of invasive squamous cell carcinoma of the cervix and general  treatment, highlighting the role of radiotherapy in the management.  We discussed the available radiation techniques, and focused on the details of logistics and delivery.  We reviewed the anticipated acute and late sequelae associated with radiation in this setting.  The patient was encouraged to ask questions that I answered to the best of my ability.  A patient consent form was discussed and signed.  We retained a copy for our records.  The patient would like to proceed with radiation and will be scheduled for CT simulation.  PLAN: The patient  will proceed with CT simulation on Monday, September 26 with treatments to begin approximately a week later concomitant with radiosensitizing chemotherapy.  She will receive 5 weeks of external beam radiation therapy followed by brachytherapy treatments.  We will coordinate the patient's first brachytherapy procedure with Dr. Berline Lopes so that she can do exam under anesthesia to see if the patient may be a candidate for extrafascial hysterectomy.   60 minutes of total time was spent for this patient encounter, including preparation, face-to-face counseling with the patient and coordination of care, physical exam, and documentation of the encounter.   ------------------------------------------------  Blair Promise, PhD, MD  This document serves as a record of services personally performed by Gery Pray, MD. It was created on his behalf by Roney Mans, a trained medical scribe. The creation of this record is based on the scribe's personal observations and the provider's statements to them. This document has been checked and approved by the attending provider.

## 2021-07-19 ENCOUNTER — Other Ambulatory Visit: Payer: Self-pay

## 2021-07-19 ENCOUNTER — Ambulatory Visit
Admission: RE | Admit: 2021-07-19 | Discharge: 2021-07-19 | Disposition: A | Discharge status: Home / Self Care | Payer: 59 | Source: Ambulatory Visit | Attending: Radiation Oncology | Admitting: Radiation Oncology

## 2021-07-19 ENCOUNTER — Encounter: Payer: Self-pay | Admitting: Radiation Oncology

## 2021-07-19 VITALS — BP 108/71 | HR 80 | Temp 97.8°F | Resp 18 | Ht 69.0 in | Wt 169.6 lb

## 2021-07-19 DIAGNOSIS — Z87891 Personal history of nicotine dependence: Secondary | ICD-10-CM | POA: Insufficient documentation

## 2021-07-19 DIAGNOSIS — E039 Hypothyroidism, unspecified: Secondary | ICD-10-CM | POA: Insufficient documentation

## 2021-07-19 DIAGNOSIS — G4700 Insomnia, unspecified: Secondary | ICD-10-CM | POA: Diagnosis not present

## 2021-07-19 DIAGNOSIS — Z79899 Other long term (current) drug therapy: Secondary | ICD-10-CM | POA: Diagnosis not present

## 2021-07-19 DIAGNOSIS — J351 Hypertrophy of tonsils: Secondary | ICD-10-CM | POA: Diagnosis not present

## 2021-07-19 DIAGNOSIS — C539 Malignant neoplasm of cervix uteri, unspecified: Secondary | ICD-10-CM | POA: Diagnosis not present

## 2021-07-19 DIAGNOSIS — R87810 Cervical high risk human papillomavirus (HPV) DNA test positive: Secondary | ICD-10-CM | POA: Diagnosis not present

## 2021-07-19 DIAGNOSIS — E7800 Pure hypercholesterolemia, unspecified: Secondary | ICD-10-CM | POA: Insufficient documentation

## 2021-07-19 NOTE — Progress Notes (Signed)
See MD note for nursing evaluation. °

## 2021-07-22 ENCOUNTER — Encounter: Payer: Self-pay | Admitting: *Deleted

## 2021-07-22 ENCOUNTER — Telehealth: Payer: Self-pay | Admitting: Oncology

## 2021-07-22 ENCOUNTER — Inpatient Hospital Stay (HOSPITAL_BASED_OUTPATIENT_CLINIC_OR_DEPARTMENT_OTHER): Payer: 59 | Admitting: Hematology and Oncology

## 2021-07-22 ENCOUNTER — Ambulatory Visit: Payer: 59 | Admitting: Gynecologic Oncology

## 2021-07-22 ENCOUNTER — Encounter: Payer: Self-pay | Admitting: Hematology and Oncology

## 2021-07-22 ENCOUNTER — Other Ambulatory Visit: Payer: Self-pay

## 2021-07-22 ENCOUNTER — Ambulatory Visit
Admission: RE | Admit: 2021-07-22 | Discharge: 2021-07-22 | Disposition: A | Discharge status: Home / Self Care | Payer: 59 | Source: Ambulatory Visit | Attending: Radiation Oncology | Admitting: Radiation Oncology

## 2021-07-22 ENCOUNTER — Other Ambulatory Visit (HOSPITAL_COMMUNITY): Payer: Self-pay

## 2021-07-22 ENCOUNTER — Other Ambulatory Visit: Payer: Self-pay | Admitting: Oncology

## 2021-07-22 VITALS — BP 125/63 | HR 80 | Temp 98.0°F | Resp 18 | Ht 69.0 in | Wt 172.4 lb

## 2021-07-22 DIAGNOSIS — C539 Malignant neoplasm of cervix uteri, unspecified: Secondary | ICD-10-CM | POA: Diagnosis not present

## 2021-07-22 DIAGNOSIS — Z87891 Personal history of nicotine dependence: Secondary | ICD-10-CM

## 2021-07-22 MED ORDER — ONDANSETRON HCL 8 MG PO TABS
8.0000 mg | ORAL_TABLET | Freq: Three times a day (TID) | ORAL | 1 refills | Status: DC | PRN
  Filled 2021-07-22: qty 30, 10d supply, fill #0

## 2021-07-22 MED ORDER — PROCHLORPERAZINE MALEATE 10 MG PO TABS
10.0000 mg | ORAL_TABLET | Freq: Four times a day (QID) | ORAL | 1 refills | Status: DC | PRN
  Filled 2021-07-22: qty 30, 8d supply, fill #0

## 2021-07-22 MED ORDER — LIDOCAINE-PRILOCAINE 2.5-2.5 % EX CREA
TOPICAL_CREAM | CUTANEOUS | 3 refills | Status: DC
  Filled 2021-07-22: qty 30, 30d supply, fill #0

## 2021-07-22 NOTE — Progress Notes (Signed)
START OFF PATHWAY REGIMEN - Other   [id number]:Cisplatin 40 mg/m2 IV D1 q7 Days + RT:   A cycle is every 7 days:     Cisplatin   **Always confirm dose/schedule in your pharmacy ordering system**  Patient Characteristics: Intent of Therapy: Curative Intent, Discussed with Patient 

## 2021-07-22 NOTE — Progress Notes (Signed)
Gynecologic Oncology Multi-Disciplinary Disposition Conference Note  Date of the Conference: 07/22/2021  Patient Name: Ashley Hammond  Referring Provider: Dr. Talbert Nan Primary GYN Oncologist: Dr. Denman George  Stage/Disposition:  Clinical stage IB3 squamous cell carcinoma of the cervix. Disposition is to primary chemo radiation with 1-2 brachytherapy treatments followed by interval extrafascial hysterectomy at 6 weeks post treatment followed by PET scan at 3 months post treatment.   This Multidisciplinary conference took place involving physicians from [place], [place], Radiation Oncology, Pathology, Radiology along with the Gynecologic Oncology Nurse Practitioner and RN.  Comprehensive assessment of the patient's malignancy, staging, need for surgery, chemotherapy, radiation therapy, and need for further testing were reviewed. Supportive measures, both inpatient and following discharge were also discussed. The recommended plan of care is documented. Greater than 35 minutes were spent correlating and coordinating this patient's care.

## 2021-07-22 NOTE — Progress Notes (Signed)
[place] Psychosocial Distress Screening Clinical Social Work  Clinical Social Work was referred by distress screening protocol.  The patient scored a 5 on the Psychosocial Distress Thermometer which indicates moderate distress. Clinical Social Worker contacted patient by phone to assess for distress and other psychosocial needs.  Patient stated she recently met with radiation oncology and was seeing medical oncology today to get more information on her treatment plan and if/when she would start chemotherapy.  Patient identified her primary concern as financial due to insurance coverage.  Patient is concerned about overall total out of pocket cost.  CSW informed patient that the financial advocates may be able to provide additional information in insurance and assistance.  CSW encouraged patient to call her insurance company to determine what/if there is a out of pocket max for the policy.  CSW will also share patients concerns with the [facility] financial advocate team.  CSW provided education on CSW role and support team at [facility].  CSW and patient discussed support services and programs at [facility], and patient was agreeable to being added to the support programs mailing list.  CSW provided contact information and encouraged patient to call with questions or concerns.           ONCBCN DISTRESS SCREENING 07/19/2021  Screening Type Initial Screening  Distress experienced in past week (1-10) 5  Practical problem type Insurance  Family Problem type Partner  Emotional problem type Depression;Nervousness/Anxiety;Adjusting to illness;Isolation/feeling alone  Physical Problem type Sleep/insomnia;Loss of appetitie;Constipation/diarrhea  Referral to clinical social work Yes    Johnnye Lana, MSW, LCSW, OSW-C Clinical Social Worker [facility] [phone]

## 2021-07-22 NOTE — Telephone Encounter (Signed)
Left a message with port placement appointment and instructions for 07/30/21 at [facility].  Requested a return call to confirm.

## 2021-07-23 ENCOUNTER — Encounter: Payer: Self-pay | Admitting: Hematology and Oncology

## 2021-07-23 NOTE — Progress Notes (Signed)
[place] CONSULT NOTE  Patient Care Team: [name], Cresenciano Lick, MD as PCP - General (Internal Medicine) Freada Bergeron, MD as PCP - Cardiology (Cardiology)  ASSESSMENT & PLAN:  Malignant neoplasm of cervix [facility]) Case was discussed at the GYN oncology tumor board Plan would be to undergo concurrent chemoradiation therapy followed by possible surgery 6 weeks posttreatment and follow-up PET CT scan in 3 months Her PET/CT imaging will review at the tumor board and I am not concerned about other ancillary findings  We discussed the role of chemotherapy. The intent is of curative intent.  We discussed some of the risks, benefits, side-effects of cisplatin and its role as chemo sensitizing agent. The plan for weekly cisplatin for x5 doses along with radiation treatment.  Some of the short term side-effects included, though not limited to, including weight loss, life threatening infections, risk of allergic reactions, need for transfusions of blood products, nausea, vomiting, change in bowel habits, loss of hair, admission to hospital for various reasons, and risks of death.   Long term side-effects are also discussed including risks of infertility, permanent damage to nerve function, hearing loss, chronic fatigue, kidney damage with possibility needing hemodialysis, and rare secondary malignancy including bone marrow disorders.  The patient is aware that the response rates discussed earlier is not guaranteed.  After a long discussion, patient made an informed decision to proceed with the prescribed plan of care.   Patient education material was dispensed.  We will schedule port placement, chemo education class and future follow-up    Orders Placed This Encounter  Procedures   IR IMAGING GUIDED PORT INSERTION    Standing Status:   Future    Standing Expiration Date:   07/22/2022    Order Specific Question:   Reason for Exam (SYMPTOM  OR DIAGNOSIS REQUIRED)    Answer:    need port for chemo next week    Order Specific Question:   Is the patient pregnant?    Answer:   No    Order Specific Question:   Preferred Imaging Location?    Answer:   [facility]   CBC with Differential (Cancer Center Only)    Standing Status:   Standing    Number of Occurrences:   20    Standing Expiration Date:   01/27/4741   Basic Metabolic Panel - [place] Only    Standing Status:   Standing    Number of Occurrences:   20    Standing Expiration Date:   07/22/2022   Magnesium    Standing Status:   Standing    Number of Occurrences:   20    Standing Expiration Date:   07/22/2022    The total time spent in the appointment was 60 minutes encounter with patients including review of chart and various tests results, discussions about plan of care and coordination of care plan   All questions were answered. The patient knows to call the clinic with any problems, questions or concerns. No barriers to learning was detected.  [name], MD 9/27/20227:42 AM  CHIEF COMPLAINTS/PURPOSE OF CONSULTATION:  Cervical cancer, for evaluation and recommendation  HISTORY OF PRESENTING ILLNESS:  Ashley Hammond 55 y.o. female is here because of recent diagnosis of cervical cancer She is here accompanied by her best friend, Ashley Hammond The patient was diagnosed with cervical cancer recently and her case was reviewed at the GYN tumor board The plan will be to proceed with concurrent chemoradiation therapy She  has intermittent vaginal bleeding Denies pelvic pain or changes in bowel habits  I have reviewed her chart and materials related to her cancer extensively and collaborated history with the patient. Summary of oncologic history is as follows: Oncology History  Malignant neoplasm of cervix ([place])  05/20/2021 Initial Diagnosis   The patient reported a history of light vaginal bleeding since approximately May 2022.  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  Due to the abnormal bleeding that she was receiving she was then recommended to follow-up with her gynecologist, Dr. Talbert Nan, who first evaluated the endometrium with an endometrial biopsy performed on 05/20/2021 which revealed benign inactive atrophic endometrium negative for hyperplasia or malignancy.  The bleeding continued and the patient returned for a colposcopic evaluation of the cervix with biopsies (due to the presence of high risk HPV).  This took place on 06/17/2021.  At the time of that colposcopic evaluation there was some abnormal bleeding noted and friability to the cervix.  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   05/20/2021 Pathology Results   FINAL MICROSCOPIC DIAGNOSIS:   A. ENDOMETRIUM, BIOPSY:  - Benign inactive atrophic endometrium  - Negative for hyperplasia or malignancy   05/30/2021 Imaging   US pelvis Small anteverted uterus Thin, symmetrical endometrium Normal ovaries bilaterally No free fluid.   06/17/2021 Pathology Results   FINAL MICROSCOPIC DIAGNOSIS:   A. CERVIX, 6 O'CLOCK, BIOPSY:  -  Invasive squamous cell carcinoma  -  See comment   B. ENDOCERVIX, CURETTAGE:  -  Squamous cell carcinoma  -  See comment   COMMENT:   A.  The carcinoma is positive for p16 and p40 supporting the diagnosis of invasive squamous cell carcinoma.     07/11/2021 PET scan   Intense hypermetabolic activity associated with ill-defined nodular enlargement of the cervix/lower uterine segment, consistent with patient's biopsy proven cervical neoplasm.   No definite scintigraphic evidence of metastatic disease in the neck, chest, abdomen or pelvis.   Right middle lobe 4-5 mm perifissural pulmonary nodule unchanged in size from  October 25, 2020 and below the resolution of PET-CT but favored to reflect a peri- fissural lymph node, attention on follow-up studies suggested   Nodular focus of hypermetabolic activity along the right anterior sublingual region, which is nonspecific correlation with direct visualization is suggested.   Hypermetabolic hyperplasia of the tonsils with a hypermetabolic 5 mm left level 1b cervical lymph node, favored reactive.   Hypermetabolic activity associated with callus formation in the right anterior sixth rib likely representing a healing nonpathologic rib fracture.   07/19/2021 Initial Diagnosis   Malignant neoplasm of cervix ([place])   07/23/2021 Cancer Staging   Staging form: Cervix Uteri, AJCC Version 9 - Clinical stage from 07/23/2021: FIGO Stage IB3 (cT1b3, cN0, cM0) - Signed by [name], MD on 07/23/2021 Stage prefix: Initial diagnosis   08/02/2021 -  Chemotherapy   Patient is on Treatment Plan : Cervical Cisplatin q7d       MEDICAL HISTORY:  Past Medical History:  Diagnosis Date   Anxiety    Arthritis    Arthritis of ankle    Right   Cervical cancer ([place])    Depression    Episode of loss of consciousness  Hypothyroidism    Insomnia    Pure hypercholesterolemia     SURGICAL HISTORY: Past Surgical History:  Procedure Laterality Date   ANKLE SURGERY Right    [facility]   WISDOM TOOTH EXTRACTION      SOCIAL HISTORY: Social History   Socioeconomic History   Marital status: Married    Spouse name: Not on file   Number of children: 2   Years of education: Not on file   Highest education level: Not on file  Occupational History   Not on file  Tobacco Use   Smoking status: Former    Packs/day: 0.25    Years: 25.00    Pack years: 6.25    Types: Cigarettes    Quit date: 2016    Years since quitting: 6.7   Smokeless tobacco: Never   Tobacco comments:    20+ yrs  Vaping Use   Vaping Use: Never used  Substance and Sexual Activity   Alcohol use: Not  Currently    Comment: 14 drinks a week of wine   Drug use: Yes    Types: Marijuana    Comment: Last use 07-10-21   Sexual activity: Not Currently    Birth control/protection: Post-menopausal  Other Topics Concern   Not on file  Social History Narrative   Not on file   Social Determinants of Health   Financial Resource Strain: Not on file  Food Insecurity: Not on file  Transportation Needs: Not on file  Physical Activity: Not on file  Stress: Not on file  Social Connections: Not on file  Intimate Partner Violence: Not on file    FAMILY HISTORY: Family History  Problem Relation Age of Onset   Heart attack Father    Diabetes Brother    Pancreatic cancer Maternal Grandmother    Ovarian cancer Other    Endometrial cancer Other    Colon cancer Other    Prostate cancer Other    Pancreatic cancer Other    Sudden death Neg Hx    Hyperlipidemia Neg Hx    Hypertension Neg Hx    Breast cancer Neg Hx     ALLERGIES:  has No Known Allergies.  MEDICATIONS:  Current Outpatient Medications  Medication Sig Dispense Refill   ALPRAZolam (XANAX) 0.5 MG tablet Take 1 tablet (0.5 mg total) by mouth at bedtime as needed for anxiety. 90 tablet 1   Apoaequorin (PREVAGEN EXTRA STRENGTH) 20 MG CAPS Take 1 capsule by mouth daily.     Bioflavonoid Products (ESTER-C) 500-550 MG TABS Take by mouth.     Brimonidine Tartrate (LUMIFY) 0.025 % SOLN Place 1 drop into both eyes daily as needed (Red eye).     buPROPion (WELLBUTRIN XL) 300 MG 24 hr tablet TAKE ONE TABLET BY MOUTH DAILY 90 tablet 1   FLUoxetine (PROZAC) 10 MG capsule TAKE ONE CAPSULE BY MOUTH DAILY 90 capsule 0   lamoTRIgine (LAMICTAL) 25 MG tablet Take 25 mg by mouth daily.     levothyroxine (SYNTHROID) 50 MCG tablet TAKE ONE TABLET BY MOUTH DAILY ON AN EMPTY STOMACH 30 MINUTES BEFORE BREAKFAST 90 tablet 0   lidocaine-prilocaine (EMLA) cream Apply to affected area once 30 g 3   Magnesium 400 MG CAPS Take 400 mg by mouth daily.      olopatadine (PATANOL) 0.1 % ophthalmic solution Place 1 drop into both eyes daily as needed for allergies.     ondansetron (ZOFRAN) 8 MG tablet Take 1 tablet (8 mg total) by mouth every 8 (eight)  hours as needed. 30 tablet 1   prochlorperazine (COMPAZINE) 10 MG tablet Take 1 tablet (10 mg total) by mouth every 6 (six) hours as needed for nausea or vomiting. 30 tablet 1   rosuvastatin (CRESTOR) 10 MG tablet Take 10 mg by mouth daily.     temazepam (RESTORIL) 15 MG capsule Take 15-30 mg by mouth at bedtime as needed for sleep. (Patient not taking: Reported on 07/19/2021)     zolpidem (AMBIEN) 10 MG tablet TAKE ONE TABLET BY MOUTH EVERY NIGHT AT BEDTIME AS NEEDED 30 tablet 2   No current facility-administered medications for this visit.    REVIEW OF SYSTEMS:   Constitutional: Denies fevers, chills or abnormal night sweats Eyes: Denies blurriness of vision, double vision or watery eyes Ears, nose, mouth, throat, and face: Denies mucositis or sore throat Respiratory: Denies cough, dyspnea or wheezes Cardiovascular: Denies palpitation, chest discomfort or lower extremity swelling Gastrointestinal:  Denies nausea, heartburn or change in bowel habits Skin: Denies abnormal skin rashes Lymphatics: Denies new lymphadenopathy or easy bruising Neurological:Denies numbness, tingling or new weaknesses Behavioral/Psych: Mood is stable, no new changes  All other systems were reviewed with the patient and are negative.  PHYSICAL EXAMINATION: ECOG PERFORMANCE STATUS: 1 - Symptomatic but completely ambulatory  Vitals:   07/22/21 1310  BP: 125/63  Pulse: 80  Resp: 18  Temp: 98 F (36.7 C)  SpO2: 100%   Filed Weights   07/22/21 1310  Weight: 172 lb 6.4 oz (78.2 kg)    GENERAL:alert, no distress and comfortable SKIN: skin color, texture, turgor are normal, no rashes or significant lesions EYES: normal, conjunctiva are pink and non-injected, sclera clear OROPHARYNX:no exudate, no erythema and lips,  buccal mucosa, and tongue normal  NECK: supple, thyroid normal size, non-tender, without nodularity LYMPH:  no palpable lymphadenopathy in the cervical, axillary or inguinal LUNGS: clear to auscultation and percussion with normal breathing effort HEART: regular rate & rhythm and no murmurs and no lower extremity edema ABDOMEN:abdomen soft, non-tender and normal bowel sounds Musculoskeletal:no cyanosis of digits and no clubbing  PSYCH: alert & oriented x 3 with fluent speech NEURO: no focal motor/sensory deficits  LABORATORY DATA:  I have reviewed the data as listed Lab Results  Component Value Date   WBC 7.7 07/11/2021   HGB 14.0 07/11/2021   HCT 42.3 07/11/2021   MCV 99.1 07/11/2021   PLT 266 07/11/2021   Recent Labs    09/12/20 1200 05/07/21 1216 07/11/21 1357  NA 142 142 138  K 5.5* 5.2 4.2  CL 104 103 102  CO2 30 29 29   GLUCOSE 110* 102* 104*  BUN 14 11 14   CREATININE 0.82 0.83 0.71  CALCIUM 10.2 10.1 9.6  GFRNONAA 80  --  >60  GFRAA 93  --   --   PROT 6.6 7.0  --   AST 19 22  --   ALT 14 16  --   BILITOT 0.4 0.5  --     RADIOGRAPHIC STUDIES: I have personally reviewed the radiological images as listed and agreed with the findings in the report. NM PET Image Initial (PI) Skull Base To Thigh (F-18 FDG)  Result Date: 07/12/2021 CLINICAL DATA:  Initial treatment strategy for uterine/cervical cancer. EXAM: NUCLEAR MEDICINE PET SKULL BASE TO THIGH TECHNIQUE: 8.1 mCi F-18 FDG was injected intravenously. Full-ring PET imaging was performed from the skull base to thigh after the radiotracer. CT data was obtained and used for attenuation correction and anatomic localization. Fasting blood glucose: 107  mg/dl COMPARISON:  CT October 25, 2020. FINDINGS: Mediastinal blood pool activity: SUV max 2.47 Liver activity: SUV max NA NECK: Hypermetabolic 5 mm left level 1b lymph node on CT image 42/4 with a max SUV of 3.39. Mild the slightly asymmetric to the right hypermetabolic  hyperplasia of the tonsils, favored reactive. Nodular focus of hypermetabolic activity with a max SUV of 5.48 along the right anterior sublingual region on axial fused image 197. Incidental CT findings: Dental hardware. CHEST: No hypermetabolic mediastinal or hilar nodes. Peri-fissural 4-5 mm right middle lobe pulmonary nodule on image 46/8 unchanged in size from CT October 25, 2020 which is below the resolution of PET-CT, nonspecific but favored represent a peri-fissural lymph node no additional suspicious pulmonary nodules or masses. Incidental CT findings: No focal airspace consolidation. Normal caliber aorta and central pulmonary arteries. Normal size heart. No significant pericardial effusion/thickening. ABDOMEN/PELVIS: Intense hypermetabolic activity associated with ill-defined nodular enlargement of the cervix/left lower uterine segment with a max SUV of 18.08. No abnormal hypermetabolic activity within the liver, pancreas, adrenal glands or spleen. No hypermetabolic adenopathy in the abdomen or pelvis. Incidental CT findings: Unremarkable noncontrast appearance of the liver, pancreas, spleen and kidneys. No evidence of bowel obstruction. Sigmoid colonic diverticulosis without findings of acute diverticulitis. SKELETON: Hypermetabolic activity associated with callus formation in the right anterior sixth rib without associated aggressive lytic or blastic lesion of bone on image 109/4, likely reflecting a healing nonpathologic rib fracture. No definite abnormal hypermetabolic activity to suggest skeletal metastasis. Incidental CT findings: none IMPRESSION: Intense hypermetabolic activity associated with ill-defined nodular enlargement of the cervix/lower uterine segment, consistent with patient's biopsy proven cervical neoplasm. No definite scintigraphic evidence of metastatic disease in the neck, chest, abdomen or pelvis. Right middle lobe 4-5 mm perifissural pulmonary nodule unchanged in size from October 25, 2020 and below the resolution of PET-CT but favored to reflect a peri- fissural lymph node, attention on follow-up studies suggested Nodular focus of hypermetabolic activity along the right anterior sublingual region, which is nonspecific correlation with direct visualization is suggested. Hypermetabolic hyperplasia of the tonsils with a hypermetabolic 5 mm left level 1b cervical lymph node, favored reactive. Hypermetabolic activity associated with callus formation in the right anterior sixth rib likely representing a healing nonpathologic rib fracture. Electronically Signed   By: Dahlia Bailiff M.D.   On: 07/12/2021 08:51

## 2021-07-23 NOTE — Telephone Encounter (Signed)
Ashley Hammond called back and confirmed her port appointment.  Also reviewed all instructions for the appointment.  She verbalized understanding and agreement.

## 2021-07-23 NOTE — Assessment & Plan Note (Signed)
Case was discussed at the GYN oncology tumor board Plan would be to undergo concurrent chemoradiation therapy followed by possible surgery 6 weeks posttreatment and follow-up PET CT scan in 3 months Her PET/CT imaging will review at the tumor board and I am not concerned about other ancillary findings  We discussed the role of chemotherapy. The intent is of curative intent.  We discussed some of the risks, benefits, side-effects of cisplatin and its role as chemo sensitizing agent. The plan for weekly cisplatin for x5 doses along with radiation treatment.  Some of the short term side-effects included, though not limited to, including weight loss, life threatening infections, risk of allergic reactions, need for transfusions of blood products, nausea, vomiting, change in bowel habits, loss of hair, admission to hospital for various reasons, and risks of death.   Long term side-effects are also discussed including risks of infertility, permanent damage to nerve function, hearing loss, chronic fatigue, kidney damage with possibility needing hemodialysis, and rare secondary malignancy including bone marrow disorders.  The patient is aware that the response rates discussed earlier is not guaranteed.  After a long discussion, patient made an informed decision to proceed with the prescribed plan of care.   Patient education material was dispensed.  We will schedule port placement, chemo education class and future follow-up

## 2021-07-24 ENCOUNTER — Ambulatory Visit: Payer: 59 | Admitting: Gynecologic Oncology

## 2021-07-24 ENCOUNTER — Other Ambulatory Visit: Payer: Self-pay | Admitting: Hematology and Oncology

## 2021-07-25 ENCOUNTER — Encounter (HOSPITAL_COMMUNITY): Payer: Self-pay | Admitting: Emergency Medicine

## 2021-07-25 ENCOUNTER — Emergency Department (HOSPITAL_COMMUNITY)
Admission: EM | Admit: 2021-07-25 | Discharge: 2021-07-26 | Disposition: A | Discharge status: Home / Self Care | Payer: 59 | Attending: Emergency Medicine | Admitting: Emergency Medicine

## 2021-07-25 ENCOUNTER — Other Ambulatory Visit: Payer: Self-pay

## 2021-07-25 ENCOUNTER — Emergency Department (HOSPITAL_COMMUNITY): Payer: 59

## 2021-07-25 DIAGNOSIS — Z79899 Other long term (current) drug therapy: Secondary | ICD-10-CM | POA: Diagnosis not present

## 2021-07-25 DIAGNOSIS — R569 Unspecified convulsions: Secondary | ICD-10-CM | POA: Diagnosis not present

## 2021-07-25 DIAGNOSIS — E039 Hypothyroidism, unspecified: Secondary | ICD-10-CM | POA: Diagnosis not present

## 2021-07-25 DIAGNOSIS — Z8541 Personal history of malignant neoplasm of cervix uteri: Secondary | ICD-10-CM | POA: Diagnosis not present

## 2021-07-25 DIAGNOSIS — Z87891 Personal history of nicotine dependence: Secondary | ICD-10-CM | POA: Diagnosis not present

## 2021-07-25 LAB — CBC WITH DIFFERENTIAL/PLATELET
Abs Immature Granulocytes: 0.04 10*3/uL (ref 0.00–0.07)
Basophils Absolute: 0.1 10*3/uL (ref 0.0–0.1)
Basophils Relative: 1 %
Eosinophils Absolute: 0.3 10*3/uL (ref 0.0–0.5)
Eosinophils Relative: 5 %
HCT: 39.4 % (ref 36.0–46.0)
Hemoglobin: 13.3 g/dL (ref 12.0–15.0)
Immature Granulocytes: 1 %
Lymphocytes Relative: 15 %
Lymphs Abs: 1 10*3/uL (ref 0.7–4.0)
MCH: 33.6 pg (ref 26.0–34.0)
MCHC: 33.8 g/dL (ref 30.0–36.0)
MCV: 99.5 fL (ref 80.0–100.0)
Monocytes Absolute: 0.3 10*3/uL (ref 0.1–1.0)
Monocytes Relative: 5 %
Neutro Abs: 5.3 10*3/uL (ref 1.7–7.7)
Neutrophils Relative %: 73 %
Platelets: 249 10*3/uL (ref 150–400)
RBC: 3.96 MIL/uL (ref 3.87–5.11)
RDW: 12 % (ref 11.5–15.5)
WBC: 7.1 10*3/uL (ref 4.0–10.5)
nRBC: 0 % (ref 0.0–0.2)

## 2021-07-25 LAB — COMPREHENSIVE METABOLIC PANEL
ALT: 19 U/L (ref 0–44)
AST: 31 U/L (ref 15–41)
Albumin: 4.5 g/dL (ref 3.5–5.0)
Alkaline Phosphatase: 70 U/L (ref 38–126)
Anion gap: 17 — ABNORMAL HIGH (ref 5–15)
BUN: 12 mg/dL (ref 6–20)
CO2: 17 mmol/L — ABNORMAL LOW (ref 22–32)
Calcium: 9.8 mg/dL (ref 8.9–10.3)
Chloride: 107 mmol/L (ref 98–111)
Creatinine, Ser: 0.93 mg/dL (ref 0.44–1.00)
GFR, Estimated: >60 mL/min (ref >60)
Glucose, Bld: 165 mg/dL — ABNORMAL HIGH (ref 70–99)
Potassium: 3.9 mmol/L (ref 3.5–5.1)
Sodium: 141 mmol/L (ref 135–145)
Total Bilirubin: 0.4 mg/dL (ref 0.3–1.2)
Total Protein: 7.4 g/dL (ref 6.5–8.1)

## 2021-07-25 LAB — MAGNESIUM: Magnesium: 2.3 mg/dL (ref 1.7–2.4)

## 2021-07-25 LAB — I-STAT BETA HCG BLOOD, ED (MC, WL, AP ONLY): I-stat hCG, quantitative: <5 m[IU]/mL (ref <5)

## 2021-07-25 LAB — PHOSPHORUS: Phosphorus: 3.5 mg/dL (ref 2.5–4.6)

## 2021-07-25 LAB — TSH: TSH: 4.622 u[IU]/mL — ABNORMAL HIGH (ref 0.350–4.500)

## 2021-07-25 LAB — CBG MONITORING, ED: Glucose-Capillary: 100 mg/dL — ABNORMAL HIGH (ref 70–99)

## 2021-07-25 MED ORDER — SODIUM CHLORIDE 0.9 % IV BOLUS
1000.0000 mL | Freq: Once | INTRAVENOUS | Status: AC
  Administered 2021-07-25: 1000 mL via INTRAVENOUS

## 2021-07-25 MED ORDER — DIPHENHYDRAMINE HCL 50 MG/ML IJ SOLN
25.0000 mg | Freq: Once | INTRAMUSCULAR | Status: AC
  Administered 2021-07-25: 25 mg via INTRAVENOUS
  Filled 2021-07-25: qty 1

## 2021-07-25 MED ORDER — METOCLOPRAMIDE HCL 5 MG/ML IJ SOLN
10.0000 mg | Freq: Once | INTRAMUSCULAR | Status: AC
  Administered 2021-07-25: 10 mg via INTRAVENOUS
  Filled 2021-07-25: qty 2

## 2021-07-25 MED ORDER — GADOBUTROL 1 MMOL/ML IV SOLN
8.0000 mL | Freq: Once | INTRAVENOUS | Status: AC | PRN
  Administered 2021-07-25: 8 mL via INTRAVENOUS

## 2021-07-25 MED ORDER — SODIUM CHLORIDE 0.9 % IV SOLN
4500.0000 mg | Freq: Once | INTRAVENOUS | Status: AC
  Administered 2021-07-25: 4500 mg via INTRAVENOUS
  Filled 2021-07-25: qty 45

## 2021-07-25 NOTE — ED Triage Notes (Signed)
Patient BIBA d/t 2 witnessed grand mal seizures. 1st episode witnessed by family and lasted approx 2 minutes. 2nd episode witnessed by EMS, reports it lasted 3 minutes until 2.5 mg IV Versed given. Hx seizures and cervical cancer. Family reports last seizure was 2 weeks ago.   LH 20g

## 2021-07-25 NOTE — Discharge Instructions (Addendum)
No driving for 6 mos after last seizure according to [facility] law. Do not bathe alone, climb ladders, operate heavy machinery or do any other activity that could be dangerous if you had a seizure while doing it.   We got the results of your MRI on today's visit.  Have added a new medication to your regimen dizziness Breath, you will need to take 500 mg twice a day for the next 30 days.  You will need to schedule an appointment with your neurologist, in order to get further management of your seizure medication.  You experience any fever, chills, worsening symptoms you will need to return to the emergency department.

## 2021-07-25 NOTE — ED Provider Notes (Signed)
[facility] DEPT Provider Note   CSN: [id number] Arrival date & time: 07/25/21  1700     History Chief Complaint  Patient presents with   Seizures    Ashley Hammond is a 55 y.o. female.  55 y.o female with a PMH of Cervical cancer, Depression, presents to the ED via EMS after 2 witnessed episodes of grand mal seizures.  Reports the first episode was witnessed by family likely her mother, this acute episode was witnessed by EMS.  States it lasted about 2 to 3 minutes.  She was given IV Versed 2.5 mg.  Does have a prior history of seizures, however is currently on no medication for seizure control.  She did see a neurologist about 6 months ago, was not started on any medications.  She currently receives care for her cervical cancer by Dr.Dohemir.  Reports prior to this occurring, she began to "have a funny smell ", then suddenly she reports being on the ground.  She denies any recent illness, has not had any sick contacts.  She denies any pain at this time.    The history is provided by the patient and medical records.  Seizures Seizure activity on arrival: no   Seizure type:  Grand mal     Past Medical History:  Diagnosis Date   Anxiety    Arthritis    Arthritis of ankle    Right   Cervical cancer ([place])    Depression    Episode of loss of consciousness    Hypothyroidism    Insomnia    Pure hypercholesterolemia     Patient Active Problem List   Diagnosis Date Noted   Malignant neoplasm of cervix ([place]) 07/19/2021   Episode of loss of consciousness 10/02/2020   Elevated liver enzymes 04/17/2019   Hypothyroidism 04/17/2019   Anxiety and depression 04/17/2019   Pure hypercholesterolemia 04/24/2018   Acquired hallux varus of left foot 04/02/2018   Arthritis of right ankle 04/02/2018   Right ankle pain 07/27/2013   Insomnia 04/23/2011    Past Surgical History:  Procedure Laterality Date   ANKLE SURGERY Right    [facility]   WISDOM TOOTH  EXTRACTION       OB History     Gravida  2   Para  2   Term  2   Preterm      AB      Living  2      SAB      IAB      Ectopic      Multiple      Live Births  2           Family History  Problem Relation Age of Onset   Heart attack Father    Diabetes Brother    Pancreatic cancer Maternal Grandmother    Ovarian cancer Other    Endometrial cancer Other    Colon cancer Other    Prostate cancer Other    Pancreatic cancer Other    Sudden death Neg Hx    Hyperlipidemia Neg Hx    Hypertension Neg Hx    Breast cancer Neg Hx     Social History   Tobacco Use   Smoking status: Former    Packs/day: 0.25    Years: 25.00    Pack years: 6.25    Types: Cigarettes    Quit date: 2016    Years since quitting: 6.7   Smokeless tobacco: Never   Tobacco comments:  20+ yrs  Vaping Use   Vaping Use: Never used  Substance Use Topics   Alcohol use: Not Currently    Comment: 14 drinks a week of wine   Drug use: Yes    Types: Marijuana    Comment: Last use 07-10-21    Home Medications Prior to Admission medications   Medication Sig Start Date End Date Taking? Authorizing Provider  levETIRAcetam (KEPPRA) 500 MG tablet Take 1 tablet (500 mg total) by mouth 2 (two) times daily. 07/26/21 08/25/21 Yes Haden Suder, Beverley Fiedler, PA-C  ALPRAZolam Duanne Moron) 0.5 MG tablet Take 1 tablet (0.5 mg total) by mouth at bedtime as needed for anxiety. 04/22/20   Elby Showers, MD  Apoaequorin (PREVAGEN EXTRA STRENGTH) 20 MG CAPS Take 1 capsule by mouth daily.    [provider]  Bioflavonoid Products (ESTER-C) 500-550 MG TABS Take by mouth.    [provider]  Brimonidine Tartrate (LUMIFY) 0.025 % SOLN Place 1 drop into both eyes daily as needed (Red eye).    [provider]  buPROPion (WELLBUTRIN XL) 300 MG 24 hr tablet TAKE ONE TABLET BY MOUTH DAILY 05/14/20   Elby Showers, MD  FLUoxetine (PROZAC) 10 MG capsule TAKE ONE CAPSULE BY MOUTH DAILY 04/30/21   Elby Showers,  MD  lamoTRIgine (LAMICTAL) 25 MG tablet Take 25 mg by mouth daily.    [provider]  levothyroxine (SYNTHROID) 50 MCG tablet TAKE ONE TABLET BY MOUTH DAILY ON AN EMPTY STOMACH 30 MINUTES BEFORE BREAKFAST 05/12/21   Elby Showers, MD  lidocaine-prilocaine (EMLA) cream Apply to affected area once 07/22/21   [name], MD  Magnesium 400 MG CAPS Take 400 mg by mouth daily.    [provider]  olopatadine (PATANOL) 0.1 % ophthalmic solution Place 1 drop into both eyes daily as needed for allergies.    [provider]  ondansetron (ZOFRAN) 8 MG tablet Take 1 tablet (8 mg total) by mouth every 8 (eight) hours as needed. 07/22/21   [name], MD  prochlorperazine (COMPAZINE) 10 MG tablet Take 1 tablet (10 mg total) by mouth every 6 (six) hours as needed for nausea or vomiting. 07/22/21   [name], MD  rosuvastatin (CRESTOR) 10 MG tablet Take 10 mg by mouth daily.    [provider]  temazepam (RESTORIL) 15 MG capsule Take 15-30 mg by mouth at bedtime as needed for sleep. Patient not taking: Reported on 07/19/2021    [provider]  zolpidem (AMBIEN) 10 MG tablet TAKE ONE TABLET BY MOUTH EVERY NIGHT AT BEDTIME AS NEEDED 06/27/21   Elby Showers, MD    Allergies    Patient has no known allergies.  Review of Systems   Review of Systems  Constitutional:  Negative for chills.  HENT:  Negative for sore throat.   Respiratory:  Negative for shortness of breath.   Cardiovascular:  Negative for chest pain.  Gastrointestinal:  Negative for abdominal pain, nausea and vomiting.  Genitourinary:  Negative for flank pain.  Musculoskeletal:  Negative for back pain.  Skin:  Positive for wound.  Neurological:  Positive for seizures.  All other systems reviewed and are negative.  Physical Exam Updated Vital Signs BP 108/73   Pulse 98   Temp 97.7 F (36.5 C) (Oral)   Resp 17   SpO2 99%   Physical Exam Vitals and nursing note reviewed.  HENT:     Head:  Normocephalic.     Mouth/Throat:     Mouth:  Mucous membranes are moist.     Comments: Small bite mark to the right side of her tongue.  Neurological:     Mental Status: She is alert.    ED Results / Procedures / Treatments   Labs (all labs ordered are listed, but only abnormal results are displayed) Labs Reviewed  COMPREHENSIVE METABOLIC PANEL - Abnormal; Notable for the following components:      Result Value   CO2 17 (*)    Glucose, Bld 165 (*)    Anion gap 17 (*)    All other components within normal limits  TSH - Abnormal; Notable for the following components:   TSH 4.622 (*)    All other components within normal limits  CBG MONITORING, ED - Abnormal; Notable for the following components:   Glucose-Capillary 100 (*)    All other components within normal limits  CBC WITH DIFFERENTIAL/PLATELET  MAGNESIUM  PHOSPHORUS  URINALYSIS, ROUTINE W REFLEX MICROSCOPIC  RAPID URINE DRUG SCREEN, HOSP PERFORMED  I-STAT BETA HCG BLOOD, ED ([facility], [facility], [facility] ONLY)    EKG None  Radiology CT HEAD WO CONTRAST (5MM)  Result Date: 07/25/2021 CLINICAL DATA:  Seizure. EXAM: CT HEAD WITHOUT CONTRAST TECHNIQUE: Contiguous axial images were obtained from the base of the skull through the vertex without intravenous contrast. COMPARISON:  MRI head 10/23/2020. FINDINGS: Brain: No evidence of acute infarction, hemorrhage, hydrocephalus, extra-axial collection or mass lesion/mass effect. Vascular: No hyperdense vessel or unexpected calcification. Skull: Normal. Negative for fracture or focal lesion. Sinuses/Orbits: No acute finding. Other: None. IMPRESSION: No acute intracranial abnormality. Electronically Signed   By: Ronney Asters M.D.   On: 07/25/2021 19:01   MR Brain W and Wo Contrast  Result Date: 07/25/2021 CLINICAL DATA:  Seizure EXAM: MRI HEAD WITHOUT AND WITH CONTRAST TECHNIQUE: Multiplanar, multiecho pulse sequences of the brain and surrounding structures were obtained without and with intravenous  contrast. CONTRAST:  42mL GADAVIST GADOBUTROL 1 MMOL/ML IV SOLN COMPARISON:  10/23/2020 FINDINGS: Brain: No acute infarct, mass effect or extra-axial collection. No acute or chronic hemorrhage. Minimal multifocal hyperintense T2-weight signal within the white matter. The midline structures are normal. There is no abnormal contrast enhancement. Vascular: Major flow voids are preserved. Skull and upper cervical spine: Normal calvarium and skull base. Visualized upper cervical spine and soft tissues are normal. Sinuses/Orbits:No paranasal sinus fluid levels or advanced mucosal thickening. No mastoid or middle ear effusion. Normal orbits. IMPRESSION: 1. No acute intracranial abnormality. 2. Minimal multifocal hyperintense T2-weight signal within the white matter, nonspecific. This may be seen in the setting of chronic small vessel disease or migraine headaches. Electronically Signed   By: Ulyses Jarred M.D.   On: 07/25/2021 23:47    Procedures Procedures   Medications Ordered in ED Medications  metoCLOPramide (REGLAN) injection 10 mg (10 mg Intravenous Given 07/25/21 1921)  diphenhydrAMINE (BENADRYL) injection 25 mg (25 mg Intravenous Given 07/25/21 1923)  levETIRAcetam (KEPPRA) 4,500 mg in sodium chloride 0.9 % 250 mL IVPB (0 mg Intravenous Stopped 07/25/21 1945)  sodium chloride 0.9 % bolus 1,000 mL (0 mLs Intravenous Stopped 07/25/21 2031)  gadobutrol (GADAVIST) 1 MMOL/ML injection 8 mL (8 mLs Intravenous Contrast Given 07/25/21 2206)    ED Course  I have reviewed the triage vital signs and the nursing notes.  Pertinent labs & imaging results that were available during my care of the patient were reviewed by me and considered in my medical decision making (see chart for details).    MDM Rules/Calculators/A&P    Patient  with a history of cervical cancer presents to the ED after 2 witnessed episodes of grand mal seizure activity.  Patient does have a prior history of seizures, however according to my  chart review the only medication that she takes for these is Lamictal.  During my primary evaluation patient is postictal, she is unaware of what occurred, she does not know where she is.  She seems somewhat confused and reports the symptoms that she had prior to the seizure were consistent with different smell, felt a weird sensation to her mouth, when she suddenly remembers being in the emergency department.  In addition, there is visible trauma to the right side of her tongue, where she likely bit this during her seizure.  No palpable skull deformities, moves upper and lower extremities although very slow to follow commands.  Lungs are clear to auscultation, abdomen is soft nontender to palpation.  She denies any sick contacts, any recent sickness.  Does have a history of thyroid disease but reports this is controlled.  Used to be a patient of Dr. Denman George for her cervical cancer, however is now managed by different group.  We will place consult for neurology to obtain further recommendations on work-up.  According to prior records and extensive chart review, last MRI was performed in 2021 with no visible signs of metastasis to the brain.  You see a prior neurology visit with Dr. Anastasia Pall who recommended Lamictal to help with her seizures.  6:20 PM spoke to neurology, Dr. Quinn Axe who recommended MRI with and without, along with loading dose of Keppra 60 mg/kg if creatinine function allows.  Return of her lab work showed CBC with no leukocytosis, hemoglobin is within normal limits.  CMP without any electrolyte derangement, creatinine levels within normal limits.  LFTs unremarkable.  Magnesium is normal, phosphorus is normal.  hCG is negative.  Her TSH is slightly elevated but under control.  CT head did not show any acute finding.  CBG was normal on arrival.  She did receive a loading dose of Keppra of 4, 5 0 0 mg.  Edition she did receive Reglan, Benadryl to help with her pain.  Will obtain MRI  brain.  08:30 PM patient was reevaluated by me, with husband at the bedside who reports patient is returning back to her baseline.  She is able to answer questions adequately, does not have any slow response.  She seems to be improving, she is adamant that she would like to go home.  We discussed that she is needing a MRI brain at this time.  We will discuss further care with Dr. Quinn Axe of neurology.  MRI Brain showed:  1. No acute intracranial abnormality.  2. Minimal multifocal hyperintense T2-weight signal within the white  matter, nonspecific. This may be seen in the setting of chronic  small vessel disease or migraine headaches.      12:16 AM patient reassessed by me, continues to feel better and has returned back to baseline per husband.  She is requesting discharge home.  We did discuss the results of her MRI on today's visit.  She will follow-up with neurology, although she reports having done this in the past and had a weight of 3 months, I advised her that she continue taking her Lamictal on top of her Keppra.  She understands and agrees with management at this time, return precautions discussed at length.  Patient stable for discharge   Portions of this note were generated with Dragon dictation software. Dictation errors  may occur despite best attempts at proofreading.  Final Clinical Impression(s) / ED Diagnoses Final diagnoses:  Seizures ([place])    Rx / DC Orders ED Discharge Orders          Ordered    levETIRAcetam (KEPPRA) 500 MG tablet  2 times daily        07/26/21 0013             Ashley Fitting, PA-C 07/26/21 0017    Drenda Freeze, MD 07/26/21 [phone]

## 2021-07-25 NOTE — Progress Notes (Signed)
Pharmacist Chemotherapy Monitoring - Initial Assessment    Anticipated start date: 08/02/21   The following has been reviewed per standard work regarding the patient's treatment regimen: The patient's diagnosis, treatment plan and drug doses, and organ/hematologic function Lab orders and baseline tests specific to treatment regimen  The treatment plan start date, drug sequencing, and pre-medications Prior authorization status  Patient's documented medication list, including drug-drug interaction screen and prescriptions for anti-emetics and supportive care specific to the treatment regimen The drug concentrations, fluid compatibility, administration routes, and timing of the medications to be used The patient's access for treatment and lifetime cumulative dose history, if applicable  The patient's medication allergies and previous infusion related reactions, if applicable   Changes made to treatment plan:  N/A  Follow up needed:  Pending authorization for treatment    Judge Stall, [facility], 07/25/2021  3:57 PM

## 2021-07-25 NOTE — ED Notes (Signed)
Patient transported to MRI 
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
## 2021-07-26 ENCOUNTER — Other Ambulatory Visit (HOSPITAL_COMMUNITY): Payer: Self-pay

## 2021-07-26 MED ORDER — LEVETIRACETAM 500 MG PO TABS
500.0000 mg | ORAL_TABLET | Freq: Two times a day (BID) | ORAL | 0 refills | Status: DC
  Filled 2021-07-26: qty 60, 30d supply, fill #0

## 2021-07-29 ENCOUNTER — Other Ambulatory Visit: Payer: Self-pay | Admitting: Physician Assistant

## 2021-07-29 ENCOUNTER — Telehealth: Payer: Self-pay | Admitting: Neurology

## 2021-07-29 NOTE — Telephone Encounter (Signed)
Pt called and accepted a sooner apt for 10/6 at 1:30 pm

## 2021-07-29 NOTE — Telephone Encounter (Signed)
Pt called, would like a call from the nurse to discuss a sooner appt.

## 2021-07-29 NOTE — Telephone Encounter (Signed)
Called the patient back to see if we could get her in sooner. There was no answer.

## 2021-07-30 ENCOUNTER — Telehealth: Payer: Self-pay

## 2021-07-30 ENCOUNTER — Other Ambulatory Visit: Payer: Self-pay

## 2021-07-30 ENCOUNTER — Ambulatory Visit (HOSPITAL_COMMUNITY)
Admission: RE | Admit: 2021-07-30 | Discharge: 2021-07-30 | Disposition: A | Discharge status: Home / Self Care | Payer: 59 | Source: Ambulatory Visit | Attending: Hematology and Oncology | Admitting: Hematology and Oncology

## 2021-07-30 ENCOUNTER — Encounter (HOSPITAL_COMMUNITY): Payer: Self-pay

## 2021-07-30 DIAGNOSIS — F32A Depression, unspecified: Secondary | ICD-10-CM | POA: Diagnosis not present

## 2021-07-30 DIAGNOSIS — C539 Malignant neoplasm of cervix uteri, unspecified: Secondary | ICD-10-CM | POA: Insufficient documentation

## 2021-07-30 DIAGNOSIS — E039 Hypothyroidism, unspecified: Secondary | ICD-10-CM | POA: Insufficient documentation

## 2021-07-30 DIAGNOSIS — Z79890 Hormone replacement therapy (postmenopausal): Secondary | ICD-10-CM | POA: Diagnosis not present

## 2021-07-30 DIAGNOSIS — E7800 Pure hypercholesterolemia, unspecified: Secondary | ICD-10-CM | POA: Insufficient documentation

## 2021-07-30 DIAGNOSIS — Z87891 Personal history of nicotine dependence: Secondary | ICD-10-CM | POA: Diagnosis not present

## 2021-07-30 DIAGNOSIS — Z79899 Other long term (current) drug therapy: Secondary | ICD-10-CM | POA: Insufficient documentation

## 2021-07-30 DIAGNOSIS — F419 Anxiety disorder, unspecified: Secondary | ICD-10-CM | POA: Diagnosis not present

## 2021-07-30 HISTORY — DX: Unspecified convulsions: R56.9

## 2021-07-30 HISTORY — PX: IR IMAGING GUIDED PORT INSERTION: IMG5740

## 2021-07-30 LAB — CBC
HCT: 38.7 % (ref 36.0–46.0)
Hemoglobin: 12.6 g/dL (ref 12.0–15.0)
MCH: 32.8 pg (ref 26.0–34.0)
MCHC: 32.6 g/dL (ref 30.0–36.0)
MCV: 100.8 fL — ABNORMAL HIGH (ref 80.0–100.0)
Platelets: 290 10*3/uL (ref 150–400)
RBC: 3.84 MIL/uL — ABNORMAL LOW (ref 3.87–5.11)
RDW: 12.1 % (ref 11.5–15.5)
WBC: 6.5 10*3/uL (ref 4.0–10.5)
nRBC: 0 % (ref 0.0–0.2)

## 2021-07-30 LAB — PROTIME-INR
INR: 1 (ref 0.8–1.2)
Prothrombin Time: 12.7 seconds (ref 11.4–15.2)

## 2021-07-30 MED ORDER — SODIUM CHLORIDE 0.9 % IV SOLN: INTRAVENOUS | Status: DC

## 2021-07-30 MED ORDER — MIDAZOLAM HCL 2 MG/2ML IJ SOLN
INTRAMUSCULAR | Status: AC
  Filled 2021-07-30: qty 2

## 2021-07-30 MED ORDER — MIDAZOLAM HCL 2 MG/2ML IJ SOLN
INTRAMUSCULAR | Status: DC | PRN
  Administered 2021-07-30 (×3): 1 mg via INTRAVENOUS

## 2021-07-30 MED ORDER — HEPARIN SOD (PORK) LOCK FLUSH 100 UNIT/ML IV SOLN
INTRAVENOUS | Status: AC
  Filled 2021-07-30: qty 5

## 2021-07-30 MED ORDER — FENTANYL CITRATE (PF) 100 MCG/2ML IJ SOLN
INTRAMUSCULAR | Status: AC
  Filled 2021-07-30: qty 2

## 2021-07-30 MED ORDER — FENTANYL CITRATE (PF) 100 MCG/2ML IJ SOLN
INTRAMUSCULAR | Status: DC | PRN
  Administered 2021-07-30 (×2): 50 ug via INTRAVENOUS

## 2021-07-30 MED ORDER — LIDOCAINE-EPINEPHRINE (PF) 2 %-1:200000 IJ SOLN
INTRAMUSCULAR | Status: AC
  Administered 2021-07-30: 10 mL
  Filled 2021-07-30: qty 20

## 2021-07-30 NOTE — Consult Note (Addendum)
Chief Complaint: Patient was seen in consultation today for Port-A-Cath placement  Referring Physician(s): Gorsuch,Ni  Supervising Physician: Ruthann Cancer  Patient Status: [facility] - Out-pt  History of Present Illness: Ashley Hammond is a 55 y.o. female with past medical history significant for anxiety/depression, arthritis,  hypothyroidism, seizures, elevated cholesterol, and newly diagnosed cervical cancer.  She is scheduled today for Port-A-Cath placement to assist with chemotherapy.  Past Medical History:  Diagnosis Date   Anxiety    Arthritis    Arthritis of ankle    Right   Cervical cancer (HCC)    Depression    Episode of loss of consciousness    Hypothyroidism    Insomnia    Pure hypercholesterolemia    Seizure ([place])     Past Surgical History:  Procedure Laterality Date   ANKLE SURGERY Right    [facility]   WISDOM TOOTH EXTRACTION      Allergies: Patient has no known allergies.  Medications: Prior to Admission medications   Medication Sig Start Date End Date Taking? Authorizing Provider  ALPRAZolam Duanne Moron) 0.5 MG tablet Take 1 tablet (0.5 mg total) by mouth at bedtime as needed for anxiety. 04/22/20  Yes [name], Cresenciano Lick, MD  Bioflavonoid Products (ESTER-C) 500-550 MG TABS Take by mouth.   Yes [provider]  Brimonidine Tartrate (LUMIFY) 0.025 % SOLN Place 1 drop into both eyes daily as needed (Red eye).   Yes [provider]  buPROPion (WELLBUTRIN XL) 300 MG 24 hr tablet TAKE ONE TABLET BY MOUTH DAILY 05/14/20  Yes Elby Showers, MD  FLUoxetine (PROZAC) 10 MG capsule TAKE ONE CAPSULE BY MOUTH DAILY 04/30/21  Yes Elby Showers, MD  lamoTRIgine (LAMICTAL) 25 MG tablet Take 25 mg by mouth daily.   Yes [provider]  levETIRAcetam (KEPPRA) 500 MG tablet Take 1 tablet (500 mg total) by mouth 2 (two) times daily. 07/26/21 08/25/21 Yes Soto, Beverley Fiedler, PA-C  levothyroxine (SYNTHROID) 50 MCG tablet TAKE ONE TABLET BY MOUTH DAILY ON AN EMPTY  STOMACH 30 MINUTES BEFORE BREAKFAST 05/12/21  Yes [name], Cresenciano Lick, MD  Magnesium 400 MG CAPS Take 400 mg by mouth daily.   Yes [provider]  olopatadine (PATANOL) 0.1 % ophthalmic solution Place 1 drop into both eyes daily as needed for allergies.   Yes [provider]  rosuvastatin (CRESTOR) 10 MG tablet Take 10 mg by mouth daily.   Yes [provider]  zolpidem (AMBIEN) 10 MG tablet TAKE ONE TABLET BY MOUTH EVERY NIGHT AT BEDTIME AS NEEDED 06/27/21  Yes [name], Cresenciano Lick, MD  Apoaequorin (PREVAGEN EXTRA STRENGTH) 20 MG CAPS Take 1 capsule by mouth daily.    [provider]  lidocaine-prilocaine (EMLA) cream Apply to affected area once 07/22/21   [name], MD  ondansetron (ZOFRAN) 8 MG tablet Take 1 tablet (8 mg total) by mouth every 8 (eight) hours as needed. 07/22/21   [name], MD  prochlorperazine (COMPAZINE) 10 MG tablet Take 1 tablet (10 mg total) by mouth every 6 (six) hours as needed for nausea or vomiting. 07/22/21   [name], MD  temazepam (RESTORIL) 15 MG capsule Take 15-30 mg by mouth at bedtime as needed for sleep. Patient not taking: Reported on 07/19/2021    [provider]     Family History  Problem Relation Age of Onset   Heart attack Father    Diabetes Brother    Pancreatic cancer Maternal Grandmother    Ovarian cancer Other  Endometrial cancer Other    Colon cancer Other    Prostate cancer Other    Pancreatic cancer Other    Sudden death Neg Hx    Hyperlipidemia Neg Hx    Hypertension Neg Hx    Breast cancer Neg Hx     Social History   Socioeconomic History   Marital status: Married    Spouse name: Not on file   Number of children: 2   Years of education: Not on file   Highest education level: Not on file  Occupational History   Not on file  Tobacco Use   Smoking status: Former    Packs/day: 0.25    Years: 25.00    Pack years: 6.25    Types: Cigarettes    Quit date: 2016    Years since quitting: 6.7    Smokeless tobacco: Never   Tobacco comments:    20+ yrs  Vaping Use   Vaping Use: Never used  Substance and Sexual Activity   Alcohol use: Not Currently    Comment: 14 drinks a week of wine   Drug use: Yes    Types: Marijuana    Comment: Last use 07-10-21   Sexual activity: Not Currently    Birth control/protection: Post-menopausal  Other Topics Concern   Not on file  Social History Narrative   Not on file   Social Determinants of Health   Financial Resource Strain: Not on file  Food Insecurity: Not on file  Transportation Needs: Not on file  Physical Activity: Not on file  Stress: Not on file  Social Connections: Not on file      Review of Systems currently denies fever, headache, chest pain, dyspnea, cough, abdominal/back pain, nausea, vomiting.  She does have some persistent vaginal bleeding.  Vital Signs: BP 108/77   Pulse 88   Temp 98.5 F (36.9 C) (Oral)   Resp 18   SpO2 100%   Physical Exam awake, alert.  Chest clear to auscultation bilaterally.  Heart with regular rate and rhythm.  Abdomen soft, positive bowel sounds, nontender.  No lower extremity edema.  Imaging: CT HEAD WO CONTRAST (5MM)  Result Date: 07/25/2021 CLINICAL DATA:  Seizure. EXAM: CT HEAD WITHOUT CONTRAST TECHNIQUE: Contiguous axial images were obtained from the base of the skull through the vertex without intravenous contrast. COMPARISON:  MRI head 10/23/2020. FINDINGS: Brain: No evidence of acute infarction, hemorrhage, hydrocephalus, extra-axial collection or mass lesion/mass effect. Vascular: No hyperdense vessel or unexpected calcification. Skull: Normal. Negative for fracture or focal lesion. Sinuses/Orbits: No acute finding. Other: None. IMPRESSION: No acute intracranial abnormality. Electronically Signed   By: Ronney Asters M.D.   On: 07/25/2021 19:01   MR Brain W and Wo Contrast  Result Date: 07/25/2021 CLINICAL DATA:  Seizure EXAM: MRI HEAD WITHOUT AND WITH CONTRAST TECHNIQUE:  Multiplanar, multiecho pulse sequences of the brain and surrounding structures were obtained without and with intravenous contrast. CONTRAST:  45mL GADAVIST GADOBUTROL 1 MMOL/ML IV SOLN COMPARISON:  10/23/2020 FINDINGS: Brain: No acute infarct, mass effect or extra-axial collection. No acute or chronic hemorrhage. Minimal multifocal hyperintense T2-weight signal within the white matter. The midline structures are normal. There is no abnormal contrast enhancement. Vascular: Major flow voids are preserved. Skull and upper cervical spine: Normal calvarium and skull base. Visualized upper cervical spine and soft tissues are normal. Sinuses/Orbits:No paranasal sinus fluid levels or advanced mucosal thickening. No mastoid or middle ear effusion. Normal orbits. IMPRESSION: 1. No acute intracranial abnormality. 2. Minimal multifocal hyperintense  T2-weight signal within the white matter, nonspecific. This may be seen in the setting of chronic small vessel disease or migraine headaches. Electronically Signed   By: Ulyses Jarred M.D.   On: 07/25/2021 23:47   NM PET Image Initial (PI) Skull Base To Thigh (F-18 FDG)  Result Date: 07/12/2021 CLINICAL DATA:  Initial treatment strategy for uterine/cervical cancer. EXAM: NUCLEAR MEDICINE PET SKULL BASE TO THIGH TECHNIQUE: 8.1 mCi F-18 FDG was injected intravenously. Full-ring PET imaging was performed from the skull base to thigh after the radiotracer. CT data was obtained and used for attenuation correction and anatomic localization. Fasting blood glucose: 107 mg/dl COMPARISON:  CT October 25, 2020. FINDINGS: Mediastinal blood pool activity: SUV max 2.47 Liver activity: SUV max NA NECK: Hypermetabolic 5 mm left level 1b lymph node on CT image 42/4 with a max SUV of 3.39. Mild the slightly asymmetric to the right hypermetabolic hyperplasia of the tonsils, favored reactive. Nodular focus of hypermetabolic activity with a max SUV of 5.48 along the right anterior sublingual region  on axial fused image 197. Incidental CT findings: Dental hardware. CHEST: No hypermetabolic mediastinal or hilar nodes. Peri-fissural 4-5 mm right middle lobe pulmonary nodule on image 46/8 unchanged in size from CT October 25, 2020 which is below the resolution of PET-CT, nonspecific but favored represent a peri-fissural lymph node no additional suspicious pulmonary nodules or masses. Incidental CT findings: No focal airspace consolidation. Normal caliber aorta and central pulmonary arteries. Normal size heart. No significant pericardial effusion/thickening. ABDOMEN/PELVIS: Intense hypermetabolic activity associated with ill-defined nodular enlargement of the cervix/left lower uterine segment with a max SUV of 18.08. No abnormal hypermetabolic activity within the liver, pancreas, adrenal glands or spleen. No hypermetabolic adenopathy in the abdomen or pelvis. Incidental CT findings: Unremarkable noncontrast appearance of the liver, pancreas, spleen and kidneys. No evidence of bowel obstruction. Sigmoid colonic diverticulosis without findings of acute diverticulitis. SKELETON: Hypermetabolic activity associated with callus formation in the right anterior sixth rib without associated aggressive lytic or blastic lesion of bone on image 109/4, likely reflecting a healing nonpathologic rib fracture. No definite abnormal hypermetabolic activity to suggest skeletal metastasis. Incidental CT findings: none IMPRESSION: Intense hypermetabolic activity associated with ill-defined nodular enlargement of the cervix/lower uterine segment, consistent with patient's biopsy proven cervical neoplasm. No definite scintigraphic evidence of metastatic disease in the neck, chest, abdomen or pelvis. Right middle lobe 4-5 mm perifissural pulmonary nodule unchanged in size from October 25, 2020 and below the resolution of PET-CT but favored to reflect a peri- fissural lymph node, attention on follow-up studies suggested Nodular focus of  hypermetabolic activity along the right anterior sublingual region, which is nonspecific correlation with direct visualization is suggested. Hypermetabolic hyperplasia of the tonsils with a hypermetabolic 5 mm left level 1b cervical lymph node, favored reactive. Hypermetabolic activity associated with callus formation in the right anterior sixth rib likely representing a healing nonpathologic rib fracture. Electronically Signed   By: Dahlia Bailiff M.D.   On: 07/12/2021 08:51    Labs:  CBC: Recent Labs    09/12/20 1200 05/07/21 1216 07/11/21 1357 07/25/21 1725  WBC 5.5 5.1 7.7 7.1  HGB 14.8 14.5 14.0 13.3  HCT 43.3 43.8 42.3 39.4  PLT 271 257 266 249    COAGS: No results for input(s): INR, APTT in the last 8760 hours.  BMP: Recent Labs    09/12/20 1200 05/07/21 1216 07/11/21 1357 07/25/21 1725  NA 142 142 138 141  K 5.5* 5.2 4.2 3.9  CL 104  103 102 107  CO2 30 29 29  17*  GLUCOSE 110* 102* 104* 165*  BUN 14 11 14 12   CALCIUM 10.2 10.1 9.6 9.8  CREATININE 0.82 0.83 0.71 0.93  GFRNONAA 80  --  >60 >60  GFRAA 93  --   --   --     LIVER FUNCTION TESTS: Recent Labs    09/12/20 1200 05/07/21 1216 07/25/21 1725  BILITOT 0.4 0.5 0.4  AST 19 22 31   ALT 14 16 19   ALKPHOS  --   --  70  PROT 6.6 7.0 7.4  ALBUMIN  --   --  4.5    TUMOR MARKERS: No results for input(s): AFPTM, CEA, CA199, CHROMGRNA in the last 8760 hours.  Assessment and Plan: 55 y.o. female with past medical history significant for anxiety/depression, arthritis,  hypothyroidism, elevated cholesterol, seizures, and newly diagnosed cervical cancer.  She is scheduled today for Port-A-Cath placement to assist with chemotherapy.Risks and benefits of image guided port-a-catheter placement was discussed with the patient including, but not limited to bleeding, infection, pneumothorax, or fibrin sheath development and need for additional procedures.  All of the patient's questions were answered, patient is  agreeable to proceed. Consent signed and in chart.    Thank you for this interesting consult.  I greatly enjoyed meeting RILYN SCROGGS and look forward to participating in their care.  A copy of this report was sent to the requesting provider on this date.  Electronically Signed: D. Rowe Robert, PA-C 07/30/2021, 1:10 PM   I spent a total of  25 minutes   in face to face in clinical consultation, greater than 50% of which was counseling/coordinating care for Port-A-Cath placement

## 2021-07-30 NOTE — Discharge Instructions (Addendum)

## 2021-07-30 NOTE — Telephone Encounter (Signed)
Attempted to notify patient of completion of [facility] Form. Unable to reach or leave a message at this time. Form placed at registration desk for pick-up as requested.

## 2021-07-30 NOTE — Procedures (Signed)
Interventional Radiology Procedure Note ° °Procedure: Single Lumen Power Port Placement   ° °Access:  Right internal jugular vein ° °Findings: Catheter tip positioned at cavoatrial junction. Port is ready for immediate use.  ° °Complications: None ° °EBL: < 10 mL ° °Recommendations:  °- Ok to shower in 24 hours °- Do not submerge for 7 days °- Routine line care  ° ° °Dalaney Needle, MD ° ° ° °

## 2021-07-31 ENCOUNTER — Inpatient Hospital Stay: Payer: 59

## 2021-07-31 ENCOUNTER — Encounter: Payer: Self-pay | Admitting: Neurology

## 2021-07-31 ENCOUNTER — Ambulatory Visit
Admission: RE | Admit: 2021-07-31 | Discharge: 2021-07-31 | Disposition: A | Discharge status: Home / Self Care | Payer: 59 | Source: Ambulatory Visit | Attending: Radiation Oncology | Admitting: Radiation Oncology

## 2021-07-31 DIAGNOSIS — Z5111 Encounter for antineoplastic chemotherapy: Secondary | ICD-10-CM | POA: Insufficient documentation

## 2021-07-31 DIAGNOSIS — D630 Anemia in neoplastic disease: Secondary | ICD-10-CM | POA: Insufficient documentation

## 2021-07-31 DIAGNOSIS — C539 Malignant neoplasm of cervix uteri, unspecified: Secondary | ICD-10-CM | POA: Diagnosis present

## 2021-07-31 DIAGNOSIS — G40009 Localization-related (focal) (partial) idiopathic epilepsy and epileptic syndromes with seizures of localized onset, not intractable, without status epilepticus: Secondary | ICD-10-CM | POA: Insufficient documentation

## 2021-07-31 DIAGNOSIS — D500 Iron deficiency anemia secondary to blood loss (chronic): Secondary | ICD-10-CM | POA: Insufficient documentation

## 2021-07-31 DIAGNOSIS — Z510 Encounter for antineoplastic radiation therapy: Secondary | ICD-10-CM | POA: Diagnosis not present

## 2021-07-31 DIAGNOSIS — N920 Excessive and frequent menstruation with regular cycle: Secondary | ICD-10-CM | POA: Insufficient documentation

## 2021-07-31 LAB — BASIC METABOLIC PANEL - CANCER CENTER ONLY
Anion gap: 10 (ref 5–15)
BUN: 13 mg/dL (ref 6–20)
CO2: 28 mmol/L (ref 22–32)
Calcium: 9.6 mg/dL (ref 8.9–10.3)
Chloride: 104 mmol/L (ref 98–111)
Creatinine: 0.81 mg/dL (ref 0.44–1.00)
GFR, Estimated: >60 mL/min (ref >60)
Glucose, Bld: 109 mg/dL — ABNORMAL HIGH (ref 70–99)
Potassium: 4.3 mmol/L (ref 3.5–5.1)
Sodium: 142 mmol/L (ref 135–145)

## 2021-07-31 LAB — CBC WITH DIFFERENTIAL (CANCER CENTER ONLY)
Abs Immature Granulocytes: 0.03 10*3/uL (ref 0.00–0.07)
Basophils Absolute: 0.1 10*3/uL (ref 0.0–0.1)
Basophils Relative: 1 %
Eosinophils Absolute: 0.4 10*3/uL (ref 0.0–0.5)
Eosinophils Relative: 6 %
HCT: 35.8 % — ABNORMAL LOW (ref 36.0–46.0)
Hemoglobin: 12 g/dL (ref 12.0–15.0)
Immature Granulocytes: 1 %
Lymphocytes Relative: 20 %
Lymphs Abs: 1.3 10*3/uL (ref 0.7–4.0)
MCH: 33.1 pg (ref 26.0–34.0)
MCHC: 33.5 g/dL (ref 30.0–36.0)
MCV: 98.9 fL (ref 80.0–100.0)
Monocytes Absolute: 0.5 10*3/uL (ref 0.1–1.0)
Monocytes Relative: 7 %
Neutro Abs: 4.1 10*3/uL (ref 1.7–7.7)
Neutrophils Relative %: 65 %
Platelet Count: 249 10*3/uL (ref 150–400)
RBC: 3.62 MIL/uL — ABNORMAL LOW (ref 3.87–5.11)
RDW: 12.1 % (ref 11.5–15.5)
WBC Count: 6.3 10*3/uL (ref 4.0–10.5)
nRBC: 0 % (ref 0.0–0.2)

## 2021-07-31 LAB — MAGNESIUM: Magnesium: 1.9 mg/dL (ref 1.7–2.4)

## 2021-07-31 NOTE — Progress Notes (Signed)
Met with patient at registration to introduce myself as Arboriculturist and to offer available resources.  Discussed one-time $1000 Radio broadcast assistant to assist with personal expenses while going through treatment. Based on verbal income guidelines, she said not to worry about it.  She had concerns regarding what insurance may or may not cover and amount they may leave her with. Referred her back to insurance company for follow up questions regarding her ded/OOP and where she stands with these amounts. She had questions about Radiation and billing. Advised her they bill at the end of the treatment and provided her Meredith's number for any additional radiation questions.    She has my card for any additional financial questions or concerns.

## 2021-08-01 ENCOUNTER — Ambulatory Visit (INDEPENDENT_AMBULATORY_CARE_PROVIDER_SITE_OTHER): Payer: 59 | Admitting: Neurology

## 2021-08-01 ENCOUNTER — Ambulatory Visit
Admission: RE | Admit: 2021-08-01 | Discharge: 2021-08-01 | Disposition: A | Discharge status: Home / Self Care | Payer: 59 | Source: Ambulatory Visit | Attending: Radiation Oncology | Admitting: Radiation Oncology

## 2021-08-01 ENCOUNTER — Other Ambulatory Visit: Payer: Self-pay

## 2021-08-01 ENCOUNTER — Encounter: Payer: Self-pay | Admitting: Neurology

## 2021-08-01 VITALS — BP 109/73 | HR 76 | Ht 69.0 in | Wt 172.0 lb

## 2021-08-01 DIAGNOSIS — C538 Malignant neoplasm of overlapping sites of cervix uteri: Secondary | ICD-10-CM

## 2021-08-01 DIAGNOSIS — D500 Iron deficiency anemia secondary to blood loss (chronic): Secondary | ICD-10-CM

## 2021-08-01 DIAGNOSIS — Z5111 Encounter for antineoplastic chemotherapy: Secondary | ICD-10-CM | POA: Diagnosis not present

## 2021-08-01 DIAGNOSIS — R55 Syncope and collapse: Secondary | ICD-10-CM | POA: Diagnosis not present

## 2021-08-01 DIAGNOSIS — R0683 Snoring: Secondary | ICD-10-CM | POA: Insufficient documentation

## 2021-08-01 DIAGNOSIS — G40009 Localization-related (focal) (partial) idiopathic epilepsy and epileptic syndromes with seizures of localized onset, not intractable, without status epilepticus: Secondary | ICD-10-CM | POA: Insufficient documentation

## 2021-08-01 MED ORDER — LEVETIRACETAM 500 MG PO TABS: 500.0000 mg | ORAL_TABLET | Freq: Two times a day (BID) | ORAL | 3 refills | Status: DC

## 2021-08-01 MED ORDER — LAMOTRIGINE 25 MG PO TABS: 25.0000 mg | ORAL_TABLET | Freq: Two times a day (BID) | ORAL | 3 refills | Status: DC

## 2021-08-01 MED FILL — Fosaprepitant Dimeglumine For IV Infusion 150 MG (Base Eq): INTRAVENOUS | Qty: 5 | Status: AC

## 2021-08-01 MED FILL — Dexamethasone Sodium Phosphate Inj 100 MG/10ML: INTRAMUSCULAR | Qty: 1 | Status: AC

## 2021-08-01 NOTE — Patient Instructions (Addendum)
Levetiracetam Tablets What is this medication? LEVETIRACETAM (lee ve tye RA se tam) prevents and controls seizures in people with epilepsy. It works by calming overactive nerves in your body. This medicine may be used for other purposes; ask your health care provider or pharmacist if you have questions. COMMON BRAND NAME(S): Keppra, Roweepra What should I tell my care team before I take this medication? They need to know if you have any of these conditions: Kidney disease Suicidal thoughts, plans, or attempt; a previous suicide attempt by you or a family member An unusual or allergic reaction to levetiracetam, other medications, foods, dyes, or preservatives Pregnant or trying to get pregnant Breast-feeding How should I use this medication? Take this medication by mouth with a glass of water. Follow the directions on the prescription label. Swallow the tablets whole. Do not crush or chew this medication. You may take this medication with or without food. Take your doses at regular intervals. Do not take your medication more often than directed. Do not stop taking this medication or any of your seizure medications unless instructed by your care team. Stopping your medication suddenly can increase your seizures or their severity. A special MedGuide will be given to you by the pharmacist with each prescription and refill. Be sure to read this information carefully each time. Contact your care team about the use of this medication in children. While this medication may be prescribed for children as young as 4 years of age for selected conditions, precautions do apply. Overdosage: If you think you have taken too much of this medicine contact a poison control center or emergency room at once. NOTE: This medicine is only for you. Do not share this medicine with others. What if I miss a dose? If you miss a dose, take it as soon as you can. If it is almost time for your next dose, take only that dose. Do  not take double or extra doses. What may interact with this medication? This medication may interact with the following: Carbamazepine Colesevelam Probenecid Sevelamer This list may not describe all possible interactions. Give your health care provider a list of all the medicines, herbs, non-prescription drugs, or dietary supplements you use. Also tell them if you smoke, drink alcohol, or use illegal drugs. Some items may interact with your medicine. What should I watch for while using this medication? Visit your care team for a regular check on your progress. Wear a medical identification bracelet or chain to say you have epilepsy, and carry a card that lists all your medications. This medication may cause serious skin reactions. They can happen weeks to months after starting the medication. Contact your care team right away if you notice fevers or flu-like symptoms with a rash. The rash may be red or purple and then turn into blisters or peeling of the skin. Or, you might notice a red rash with swelling of the face, lips or lymph nodes in your neck or under your arms. It is important to take this medication exactly as instructed by your care team. When first starting treatment, your dose may need to be adjusted. It may take weeks or months before your dose is stable. You should contact your care team if your seizures get worse or if you have any new types of seizures. You may get drowsy or dizzy. Do not drive, use machinery, or do anything that needs mental alertness until you know how this medication affects you. Do not stand or sit up quickly,   especially if you are an older patient. This reduces the risk of dizzy or fainting spells. Alcohol may interfere with the effect of this medication. Avoid alcoholic drinks. The use of this medication may increase the chance of suicidal thoughts or actions. Pay special attention to how you are responding while on this medication. Any worsening of mood, or  thoughts of suicide or dying should be reported to your care team right away. Women who become pregnant while using this medication may enroll in the [place] Pregnancy Registry by calling [phone]. This registry collects information about the safety of antiepileptic medication use during pregnancy. What side effects may I notice from receiving this medication? Side effects that you should report to your care team as soon as possible: Allergic reactions-skin rash, itching, hives, swelling of the face, lips, tongue, or throat Increase in blood pressure in children Infection-fever, chills, cough, or sore throat Loss of balance or coordination Low red blood cell count-unusual weakness or fatigue, dizziness, headache, trouble breathing Mood and behavior changes-anxiety, nervousness, confusion, hallucinations, irritability, hostility, thoughts of suicide or self-harm, worsening mood, feelings of depression Redness, blistering, peeling, or loosening of the skin, including inside the mouth Trouble walking Unusual bruising or bleeding Unusual weakness or fatigue Side effects that usually do not require medical attention (report to your care team if they continue or are bothersome): Dizziness Drowsiness Fatigue Irritability Loss of appetite This list may not describe all possible side effects. Call your doctor for medical advice about side effects. You may report side effects to FDA at 1-800-FDA-1088. Where should I keep my medication? Keep out of reach of children. Store at room temperature between 15 and 30 degrees C (59 and 86 degrees F). Throw away any unused medication after the expiration date. NOTE: This sheet is a summary. It may not cover all possible information. If you have questions about this medicine, talk to your doctor, pharmacist, or health care provider.  2022 Elsevier/Gold Standard (2021-01-25 22:03:47) Lamotrigine Tablets What is this  medication? LAMOTRIGINE (la MOE Hendricks Limes) prevents and controls seizures in people with epilepsy. It may also be used to treat bipolar disorder. It works by calming overactive nerves in your body. This medicine may be used for other purposes; ask your health care provider or pharmacist if you have questions. COMMON BRAND NAME(S): Lamictal, Subvenite What should I tell my care team before I take this medication? They need to know if you have any of these conditions: Heart disease History of irregular heartbeat Immune system problems Kidney disease Liver disease Low levels of folic acid in the blood Lupus Mental illness Suicidal thoughts, plans, or attempt; a previous suicide attempt by you or a family member An unusual or allergic reaction to lamotrigine or other seizure medications, other medications, foods, dyes, or preservatives Pregnant or trying to get pregnant Breast-feeding How should I use this medication? Take this medication by mouth with a glass of water. Follow the directions on the prescription label. Do not chew these tablets. If this medication upsets your stomach, take it with food or milk. Take your doses at regular intervals. Do not take your medication more often than directed. A special MedGuide will be given to you by the pharmacist with each new prescription and refill. Be sure to read this information carefully each time. Talk to your care team about the use of this medication in children. While this medication may be prescribed for children as young as 2 years for selected conditions, precautions do  apply. Overdosage: If you think you have taken too much of this medicine contact a poison control center or emergency room at once. NOTE: This medicine is only for you. Do not share this medicine with others. What if I miss a dose? If you miss a dose, take it as soon as you can. If it is almost time for your next dose, take only that dose. Do not take double or extra  doses. What may interact with this medication? Atazanavir Birth control pills Certain medications for irregular heartbeat Certain medications for seizures like carbamazepine, phenobarbital, phenytoin, primidone, valproic acid Lopinavir Rifampin Ritonavir This list may not describe all possible interactions. Give your health care provider a list of all the medicines, herbs, non-prescription drugs, or dietary supplements you use. Also tell them if you smoke, drink alcohol, or use illegal drugs. Some items may interact with your medicine. What should I watch for while using this medication? Visit your care team for regular checks on your progress. If you take this medication for seizures, wear a Medic Alert bracelet or necklace. Carry an identification card with information about your condition, medications, and care team. It is important to take this medication exactly as directed. When first starting treatment, your dose will need to be adjusted slowly. It may take weeks or months before your dose is stable. You should contact your care team if your seizures get worse or if you have any new types of seizures. Do not stop taking this medication unless instructed by your care team. Stopping your medication suddenly can increase your seizures or their severity. This medication may cause serious skin reactions. They can happen weeks to months after starting the medication. Contact your care team right away if you notice fevers or flu-like symptoms with a rash. The rash may be red or purple and then turn into blisters or peeling of the skin. Or, you might notice a red rash with swelling of the face, lips or lymph nodes in your neck or under your arms. You may get drowsy, dizzy, or have blurred vision. Do not drive, use machinery, or do anything that needs mental alertness until you know how this medication affects you. To reduce dizzy or fainting spells, do not sit or stand up quickly, especially if you are  an older patient. Alcohol can increase drowsiness and dizziness. Avoid alcoholic drinks. If you are taking this medication for bipolar disorder, it is important to report any changes in your mood to your care team. If your condition gets worse, you get mentally depressed, feel very hyperactive or manic, have difficulty sleeping, or have thoughts of hurting yourself or committing suicide, you need to get help from your care team right away. If you are a caregiver for someone taking this medication for bipolar disorder, you should also report these behavioral changes right away. The use of this medication may increase the chance of suicidal thoughts or actions. Pay special attention to how you are responding while on this medication. Your mouth may get dry. Chewing sugarless gum or sucking hard candy, and drinking plenty of water may help. Contact your care team if the problem does not go away or is severe. Women who become pregnant while using this medication may enroll in the [place] Pregnancy Registry by calling [phone]. This registry collects information about the safety of antiepileptic medication use during pregnancy. This medication may cause a decrease in folic acid. You should make sure that you get enough folic acid while  you are taking this medication. Discuss the foods you eat and the vitamins you take with your care team. What side effects may I notice from receiving this medication? Side effects that you should report to your care team as soon as possible: Allergic reactions-skin rash, itching, hives, swelling of the face, lips, tongue, or throat Change in vision Fever, neck pain or stiffness, sensitivity to light, headache, nausea, vomiting, confusion Heart rhythm changes-fast or irregular heartbeat, dizziness, feeling faint or lightheaded, chest pain, trouble breathing Infection-fever, chills, cough, or sore throat Liver injury-right upper belly pain, loss  of appetite, nausea, light-colored stool, dark yellow or brown urine, yellowing skin or eyes, unusual weakness or fatigue Low red blood cell count-unusual weakness or fatigue, dizziness, headache, trouble breathing Rash, fever, and swollen lymph nodes Redness, blistering, peeling or loosening of the skin, including inside the mouth Thoughts of suicide or self-harm, worsening mood, or feelings of depression Unusual bruising or bleeding Side effects that usually do not require medical attention (report to your care team if they continue or are bothersome): Diarrhea Dizziness Drowsiness Headache Nausea Stomach pain Tremors or shaking This list may not describe all possible side effects. Call your doctor for medical advice about side effects. You may report side effects to FDA at 1-800-FDA-1088. Where should I keep my medication? Keep out of the reach of children and pets. Store at [employer] C (77 degrees F). Protect from light. Get rid of any unused medication after the expiration date. To get rid of medications that are no longer needed or have expired: Take the medication to a medication take-back program. Check with your pharmacy or law enforcement to find a location. If you cannot return the medication, check the label or package insert to see if the medication should be thrown out in the garbage or flushed down the toilet. If you are not sure, ask your care team. If it is safe to put it in the trash, empty the medication out of the container. Mix the medication with cat litter, dirt, coffee grounds, or other unwanted substance. Seal the mixture in a bag or container. Put it in the trash. NOTE: This sheet is a summary. It may not cover all possible information. If you have questions about this medicine, talk to your doctor, pharmacist, or health care provider.  2022 Elsevier/Gold Standard (2021-01-10 15:34:05) Seizure, Adult A seizure is a sudden burst of abnormal electrical and chemical  activity in the brain. Seizures usually last from 30 seconds to 2 minutes.  What are the causes? Common causes of this condition include: Fever or infection. Problems that affect the brain. These may include: A brain or head injury. Bleeding in the brain. A brain tumor. Low levels of blood sugar or salt. Kidney problems or liver problems. Conditions that are passed from parent to child (are inherited). Problems with a substance, such as: Having a reaction to a drug or a medicine. Stopping the use of a substance all of a sudden (withdrawal). A stroke. Disorders that affect how you develop. Sometimes, the cause may not be known.  What increases the risk? Having someone in your family who has epilepsy. In this condition, seizures happen again and again over time. They have no clear cause. Having had a tonic-clonic seizure before. This type of seizure causes you to: Tighten the muscles of the whole body. Lose consciousness. Having had a head injury or strokes before. Having had a lack of oxygen at birth. What are the signs or symptoms? There  are many types of seizures. The symptoms vary depending on the type of seizure you have. Symptoms during a seizure Shaking that you cannot control (convulsions) with fast, jerky movements of muscles. Stiffness of the body. Breathing problems. Feeling mixed up (confused). Staring or not responding to sound or touch. Head nodding. Eyes that blink, flutter, or move fast. Drooling, grunting, or making clicking sounds with your mouth Losing control of when you pee or poop. Symptoms before a seizure Feeling afraid, nervous, or worried. Feeling like you may vomit. Feeling like: You are moving when you are not. Things around you are moving when they are not. Feeling like you saw or heard something before (dj vu). Odd tastes or smells. Changes in how you see. You may see flashing lights or spots. Symptoms after a seizure Feeling  confused. Feeling sleepy. Headache. Sore muscles. How is this treated? If your seizure stops on its own, you will not need treatment. If your seizure lasts longer than 5 minutes, you will normally need treatment. Treatment may include: Medicines given through an IV tube. Avoiding things, such as medicines, that are known to cause your seizures. Medicines to prevent seizures. A device to prevent or control seizures. Surgery. A diet low in carbohydrates and high in fat (ketogenic diet). Follow these instructions at home: Medicines Take over-the-counter and prescription medicines only as told by your doctor. Avoid foods or drinks that may keep your medicine from working, such as alcohol. Activity Follow instructions about driving, swimming, or doing things that would be dangerous if you had another seizure. Wait until your doctor says it is safe for you to do these things. If you live in the [place] ask your local department of motor vehicles when you can drive. Get a lot of rest. Teaching others  Teach friends and family what to do when you have a seizure. They should: Help you get down to the ground. Protect your head and body. Loosen any clothing around your neck. Turn you on your side. Know whether or not you need emergency care. Stay with you until you are better. Also, tell them what not to do if you have a seizure. Tell them: They should not hold you down. They should not put anything in your mouth. General instructions Avoid anything that gives you seizures. Keep a seizure diary. Write down: What you remember about each seizure. What you think caused each seizure. Keep all follow-up visits. Contact a doctor if: You have another seizure or seizures. Call the doctor each time you have a seizure. The pattern of your seizures changes. You keep having seizures with treatment. You have symptoms of being sick or having an infection. You are not able to take your medicine. Get  help right away if: You have any of these problems: A seizure that lasts longer than 5 minutes. Many seizures in a row and you do not feel better between seizures. A seizure that makes it harder to breathe. A seizure and you can no longer speak or use part of your body. You do not wake up right after a seizure. You get hurt during a seizure. You feel confused or have pain right after a seizure. These symptoms may be an emergency. Get help right away. Call your local emergency services (911 in the [place] Do not wait to see if the symptoms will go away. Do not drive yourself to the hospital. Summary A seizure is a sudden burst of abnormal electrical and chemical activity in the brain. Seizures  normally last from 30 seconds to 2 minutes. Causes of seizures include illness, injury to the head, low levels of blood sugar or salt, and certain conditions. Most seizures will stop on their own in less than 5 minutes. Seizures that last longer than 5 minutes are a medical emergency and need treatment right away. Many medicines are used to treat seizures. Take over-the-counter and prescription medicines only as told by your doctor. This information is not intended to replace advice given to you by your health care provider. Make sure you discuss any questions you have with your health care provider. Document Revised: 04/20/2020 Document Reviewed: 04/20/2020 Elsevier Patient Education  [place].
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
## 2021-08-02 ENCOUNTER — Inpatient Hospital Stay: Payer: 59

## 2021-08-02 ENCOUNTER — Ambulatory Visit
Admission: RE | Admit: 2021-08-02 | Discharge: 2021-08-02 | Disposition: A | Discharge status: Home / Self Care | Payer: 59 | Source: Ambulatory Visit | Attending: Radiation Oncology | Admitting: Radiation Oncology

## 2021-08-02 VITALS — BP 120/53 | HR 84 | Temp 98.3°F | Resp 18 | Wt 170.8 lb

## 2021-08-02 DIAGNOSIS — C539 Malignant neoplasm of cervix uteri, unspecified: Secondary | ICD-10-CM

## 2021-08-02 DIAGNOSIS — Z5111 Encounter for antineoplastic chemotherapy: Secondary | ICD-10-CM | POA: Diagnosis not present

## 2021-08-02 MED ORDER — SODIUM CHLORIDE 0.9 % IV SOLN
10.0000 mg | Freq: Once | INTRAVENOUS | Status: AC
  Administered 2021-08-02: 10 mg via INTRAVENOUS
  Filled 2021-08-02: qty 10

## 2021-08-02 MED ORDER — MAGNESIUM SULFATE 2 GM/50ML IV SOLN
2.0000 g | Freq: Once | INTRAVENOUS | Status: AC
  Administered 2021-08-02: 2 g via INTRAVENOUS
  Filled 2021-08-02: qty 50

## 2021-08-02 MED ORDER — HEPARIN SOD (PORK) LOCK FLUSH 100 UNIT/ML IV SOLN
500.0000 [IU] | Freq: Once | INTRAVENOUS | Status: AC | PRN
  Administered 2021-08-02: 500 [IU]

## 2021-08-02 MED ORDER — SODIUM CHLORIDE 0.9 % IV SOLN: Freq: Once | INTRAVENOUS | Status: AC

## 2021-08-02 MED ORDER — SODIUM CHLORIDE 0.9 % IV SOLN
150.0000 mg | Freq: Once | INTRAVENOUS | Status: AC
  Administered 2021-08-02: 150 mg via INTRAVENOUS
  Filled 2021-08-02: qty 150

## 2021-08-02 MED ORDER — PALONOSETRON HCL INJECTION 0.25 MG/5ML
0.2500 mg | Freq: Once | INTRAVENOUS | Status: AC
  Administered 2021-08-02: 0.25 mg via INTRAVENOUS
  Filled 2021-08-02: qty 5

## 2021-08-02 MED ORDER — CISPLATIN CHEMO INJECTION 100MG/100ML
40.0000 mg/m2 | Freq: Once | INTRAVENOUS | Status: AC
  Administered 2021-08-02: 78 mg via INTRAVENOUS
  Filled 2021-08-02: qty 78

## 2021-08-02 MED ORDER — SODIUM CHLORIDE 0.9% FLUSH
10.0000 mL | INTRAVENOUS | Status: DC | PRN
  Administered 2021-08-02: 10 mL

## 2021-08-02 MED ORDER — POTASSIUM CHLORIDE IN NACL 20-0.9 MEQ/L-% IV SOLN
Freq: Once | INTRAVENOUS | Status: AC
  Filled 2021-08-02: qty 1000

## 2021-08-02 NOTE — Patient Instructions (Signed)
[facility]  Discharge Instructions: Thank you for choosing [facility] to provide your oncology and hematology care.   If you have a lab appointment with the [facility], please go directly to the [facility] and check in at the registration area.   Wear comfortable clothing and clothing appropriate for easy access to any Portacath or PICC line.   We strive to give you quality time with your provider. You may need to reschedule your appointment if you arrive late (15 or more minutes).  Arriving late affects you and other patients whose appointments are after yours.  Also, if you miss three or more appointments without notifying the office, you may be dismissed from the clinic at the provider's discretion.      For prescription refill requests, have your pharmacy contact our office and allow 72 hours for refills to be completed.    Today you received the following chemotherapy and/or immunotherapy agents: cisplatin       To help prevent nausea and vomiting after your treatment, we encourage you to take your nausea medication as directed.  BELOW ARE SYMPTOMS THAT SHOULD BE REPORTED IMMEDIATELY: *FEVER GREATER THAN 100.4 F (38 C) OR HIGHER *CHILLS OR SWEATING *NAUSEA AND VOMITING THAT IS NOT CONTROLLED WITH YOUR NAUSEA MEDICATION *UNUSUAL SHORTNESS OF BREATH *UNUSUAL BRUISING OR BLEEDING *URINARY PROBLEMS (pain or burning when urinating, or frequent urination) *BOWEL PROBLEMS (unusual diarrhea, constipation, pain near the anus) TENDERNESS IN MOUTH AND THROAT WITH OR WITHOUT PRESENCE OF ULCERS (sore throat, sores in mouth, or a toothache) UNUSUAL RASH, SWELLING OR PAIN  UNUSUAL VAGINAL DISCHARGE OR ITCHING   Items with * indicate a potential emergency and should be followed up as soon as possible or go to the Emergency Department if any problems should occur.  Please show the CHEMOTHERAPY ALERT CARD or IMMUNOTHERAPY ALERT CARD at check-in  to the Emergency Department and triage nurse.  Should you have questions after your visit or need to cancel or reschedule your appointment, please contact [facility]  Dept: [phone]  and follow the prompts.  Office hours are 8:00 a.m. to 4:30 p.m. Monday - Friday. Please note that voicemails left after 4:00 p.m. may not be returned until the following business day.  We are closed weekends and major holidays. You have access to a nurse at all times for urgent questions. Please call the main number to the clinic Dept: [phone] and follow the prompts.   For any non-urgent questions, you may also contact your provider using MyChart. We now offer e-Visits for anyone 42 and older to request care online for non-urgent symptoms. For details visit [email or web address].   Also download the MyChart app! Go to the app store, search "MyChart", open the app, select [facility], and log in with your MyChart username and password.  Due to Covid, a mask is required upon entering the hospital/clinic. If you do not have a mask, one will be given to you upon arrival. For doctor visits, patients may have 1 support person aged 32 or older with them. For treatment visits, patients cannot have anyone with them due to current Covid guidelines and our immunocompromised population.   Cisplatin injection What is this medication? CISPLATIN (SIS pla tin) is a chemotherapy drug. It targets fast dividing cells, like cancer cells, and causes these cells to die. This medicine is used to treat many types of cancer like bladder, ovarian, and testicular cancers. This medicine may be  used for other purposes; ask your health care provider or pharmacist if you have questions. COMMON BRAND NAME(S): Platinol, Platinol -AQ What should I tell my care team before I take this medication? They need to know if you have any of these conditions: eye disease, vision problems hearing problems kidney  disease low blood counts, like white cells, platelets, or red blood cells tingling of the fingers or toes, or other nerve disorder an unusual or allergic reaction to cisplatin, carboplatin, oxaliplatin, other medicines, foods, dyes, or preservatives pregnant or trying to get pregnant breast-feeding How should I use this medication? This drug is given as an infusion into a vein. It is administered in a hospital or clinic by a specially trained health care professional. Talk to your pediatrician regarding the use of this medicine in children. Special care may be needed. Overdosage: If you think you have taken too much of this medicine contact a poison control center or emergency room at once. NOTE: This medicine is only for you. Do not share this medicine with others. What if I miss a dose? It is important not to miss a dose. Call your doctor or health care professional if you are unable to keep an appointment. What may interact with this medication? This medicine may interact with the following medications: foscarnet certain antibiotics like amikacin, gentamicin, neomycin, polymyxin B, streptomycin, tobramycin, vancomycin This list may not describe all possible interactions. Give your health care provider a list of all the medicines, herbs, non-prescription drugs, or dietary supplements you use. Also tell them if you smoke, drink alcohol, or use illegal drugs. Some items may interact with your medicine. What should I watch for while using this medication? Your condition will be monitored carefully while you are receiving this medicine. You will need important blood work done while you are taking this medicine. This drug may make you feel generally unwell. This is not uncommon, as chemotherapy can affect healthy cells as well as cancer cells. Report any side effects. Continue your course of treatment even though you feel ill unless your doctor tells you to stop. This medicine may increase your  risk of getting an infection. Call your healthcare professional for advice if you get a fever, chills, or sore throat, or other symptoms of a cold or flu. Do not treat yourself. Try to avoid being around people who are sick. Avoid taking medicines that contain aspirin, acetaminophen, ibuprofen, naproxen, or ketoprofen unless instructed by your healthcare professional. These medicines may hide a fever. This medicine may increase your risk to bruise or bleed. Call your doctor or health care professional if you notice any unusual bleeding. Be careful brushing and flossing your teeth or using a toothpick because you may get an infection or bleed more easily. If you have any dental work done, tell your dentist you are receiving this medicine. Do not become pregnant while taking this medicine or for 14 months after stopping it. Women should inform their healthcare professional if they wish to become pregnant or think they might be pregnant. Men should not father a child while taking this medicine and for 11 months after stopping it. There is potential for serious side effects to an unborn child. Talk to your healthcare professional for more information. Do not breast-feed an infant while taking this medicine. This medicine has caused ovarian failure in some women. This medicine may make it more difficult to get pregnant. Talk to your healthcare professional if you are concerned about your fertility. This medicine has  caused decreased sperm counts in some men. This may make it more difficult to father a child. Talk to your healthcare professional if you are concerned about your fertility. Drink fluids as directed while you are taking this medicine. This will help protect your kidneys. Call your doctor or health care professional if you get diarrhea. Do not treat yourself. What side effects may I notice from receiving this medication? Side effects that you should report to your doctor or health care professional  as soon as possible: allergic reactions like skin rash, itching or hives, swelling of the face, lips, or tongue blurred vision changes in vision decreased hearing or ringing of the ears nausea, vomiting pain, redness, or irritation at site where injected pain, tingling, numbness in the hands or feet signs and symptoms of bleeding such as bloody or black, tarry stools; red or dark brown urine; spitting up blood or brown material that looks like coffee grounds; red spots on the skin; unusual bruising or bleeding from the eyes, gums, or nose signs and symptoms of infection like fever; chills; cough; sore throat; pain or trouble passing urine signs and symptoms of kidney injury like trouble passing urine or change in the amount of urine signs and symptoms of low red blood cells or anemia such as unusually weak or tired; feeling faint or lightheaded; falls; breathing problems Side effects that usually do not require medical attention (report to your doctor or health care professional if they continue or are bothersome): loss of appetite mouth sores muscle cramps This list may not describe all possible side effects. Call your doctor for medical advice about side effects. You may report side effects to FDA at 1-800-FDA-1088. Where should I keep my medication? This drug is given in a hospital or clinic and will not be stored at home. NOTE: This sheet is a summary. It may not cover all possible information. If you have questions about this medicine, talk to your doctor, pharmacist, or health care provider.  2022 Elsevier/Gold Standard (2018-10-08 15:59:17)

## 2021-08-05 ENCOUNTER — Other Ambulatory Visit: Payer: Self-pay

## 2021-08-05 ENCOUNTER — Ambulatory Visit: Payer: 59 | Admitting: Neurology

## 2021-08-05 ENCOUNTER — Ambulatory Visit
Admission: RE | Admit: 2021-08-05 | Discharge: 2021-08-05 | Disposition: A | Discharge status: Home / Self Care | Payer: 59 | Source: Ambulatory Visit | Attending: Radiation Oncology | Admitting: Radiation Oncology

## 2021-08-05 DIAGNOSIS — Z5111 Encounter for antineoplastic chemotherapy: Secondary | ICD-10-CM | POA: Diagnosis not present

## 2021-08-06 ENCOUNTER — Ambulatory Visit
Admission: RE | Admit: 2021-08-06 | Discharge: 2021-08-06 | Disposition: A | Discharge status: Home / Self Care | Payer: 59 | Source: Ambulatory Visit | Attending: Radiation Oncology | Admitting: Radiation Oncology

## 2021-08-06 ENCOUNTER — Telehealth: Payer: Self-pay | Admitting: Neurology

## 2021-08-06 DIAGNOSIS — Z5111 Encounter for antineoplastic chemotherapy: Secondary | ICD-10-CM | POA: Diagnosis not present

## 2021-08-06 NOTE — Telephone Encounter (Signed)
Called pt back. Advised I reviewed her chart and since she had new seizure activity since last EEG, Dr. Brett Fairy would recommend she complete this. This will help determine best treatment plan moving forward for her. Pt verbalized understanding. She would like to proceed w/ getting this scheduled. I reached out to [facility] to see if she could take call to schedule but she was not available. Advised pt I will send her message to call back to get her scheduled.

## 2021-08-06 NOTE — Telephone Encounter (Signed)
Called pt, scheduled EEG for 10/17 at 12:45 pm.

## 2021-08-06 NOTE — Telephone Encounter (Signed)
Called pt to schedule EEG and she requested that I send a message back to pod to see if this test is entirely necessary. She states that she just had an EEG last November and her insurance does not cover much and she is only wanting to have this done again if Dr. Brett Fairy advises that it is absolutely necessary. Would like a call back from the nurse to discuss, can be reached at [phone].

## 2021-08-07 ENCOUNTER — Ambulatory Visit
Admission: RE | Admit: 2021-08-07 | Discharge: 2021-08-07 | Disposition: A | Discharge status: Home / Self Care | Payer: 59 | Source: Ambulatory Visit | Attending: Radiation Oncology | Admitting: Radiation Oncology

## 2021-08-07 ENCOUNTER — Other Ambulatory Visit: Payer: Self-pay

## 2021-08-07 ENCOUNTER — Inpatient Hospital Stay: Payer: 59

## 2021-08-07 DIAGNOSIS — Z5111 Encounter for antineoplastic chemotherapy: Secondary | ICD-10-CM | POA: Diagnosis not present

## 2021-08-07 DIAGNOSIS — C539 Malignant neoplasm of cervix uteri, unspecified: Secondary | ICD-10-CM

## 2021-08-07 LAB — BASIC METABOLIC PANEL - CANCER CENTER ONLY
Anion gap: 6 (ref 5–15)
BUN: 14 mg/dL (ref 6–20)
CO2: 29 mmol/L (ref 22–32)
Calcium: 9.7 mg/dL (ref 8.9–10.3)
Chloride: 107 mmol/L (ref 98–111)
Creatinine: 0.71 mg/dL (ref 0.44–1.00)
GFR, Estimated: >60 mL/min (ref >60)
Glucose, Bld: 92 mg/dL (ref 70–99)
Potassium: 4.2 mmol/L (ref 3.5–5.1)
Sodium: 142 mmol/L (ref 135–145)

## 2021-08-07 LAB — CBC WITH DIFFERENTIAL (CANCER CENTER ONLY)
Abs Immature Granulocytes: 0.03 10*3/uL (ref 0.00–0.07)
Basophils Absolute: 0 10*3/uL (ref 0.0–0.1)
Basophils Relative: 1 %
Eosinophils Absolute: 0.6 10*3/uL — ABNORMAL HIGH (ref 0.0–0.5)
Eosinophils Relative: 10 %
HCT: 34.4 % — ABNORMAL LOW (ref 36.0–46.0)
Hemoglobin: 11.6 g/dL — ABNORMAL LOW (ref 12.0–15.0)
Immature Granulocytes: 1 %
Lymphocytes Relative: 16 %
Lymphs Abs: 1 10*3/uL (ref 0.7–4.0)
MCH: 32.7 pg (ref 26.0–34.0)
MCHC: 33.7 g/dL (ref 30.0–36.0)
MCV: 96.9 fL (ref 80.0–100.0)
Monocytes Absolute: 0.4 10*3/uL (ref 0.1–1.0)
Monocytes Relative: 7 %
Neutro Abs: 4.1 10*3/uL (ref 1.7–7.7)
Neutrophils Relative %: 65 %
Platelet Count: 209 10*3/uL (ref 150–400)
RBC: 3.55 MIL/uL — ABNORMAL LOW (ref 3.87–5.11)
RDW: 11.5 % (ref 11.5–15.5)
WBC Count: 6.3 10*3/uL (ref 4.0–10.5)
nRBC: 0 % (ref 0.0–0.2)

## 2021-08-07 LAB — MAGNESIUM: Magnesium: 1.9 mg/dL (ref 1.7–2.4)

## 2021-08-08 ENCOUNTER — Ambulatory Visit
Admission: RE | Admit: 2021-08-08 | Discharge: 2021-08-08 | Disposition: A | Discharge status: Home / Self Care | Payer: 59 | Source: Ambulatory Visit | Attending: Radiation Oncology | Admitting: Radiation Oncology

## 2021-08-08 ENCOUNTER — Encounter: Payer: Self-pay | Admitting: Hematology and Oncology

## 2021-08-08 ENCOUNTER — Inpatient Hospital Stay (HOSPITAL_BASED_OUTPATIENT_CLINIC_OR_DEPARTMENT_OTHER): Payer: 59 | Admitting: Hematology and Oncology

## 2021-08-08 DIAGNOSIS — C539 Malignant neoplasm of cervix uteri, unspecified: Secondary | ICD-10-CM

## 2021-08-08 DIAGNOSIS — Z5111 Encounter for antineoplastic chemotherapy: Secondary | ICD-10-CM | POA: Diagnosis not present

## 2021-08-08 DIAGNOSIS — D500 Iron deficiency anemia secondary to blood loss (chronic): Secondary | ICD-10-CM | POA: Diagnosis not present

## 2021-08-08 DIAGNOSIS — G40009 Localization-related (focal) (partial) idiopathic epilepsy and epileptic syndromes with seizures of localized onset, not intractable, without status epilepticus: Secondary | ICD-10-CM | POA: Diagnosis not present

## 2021-08-08 MED FILL — Dexamethasone Sodium Phosphate Inj 100 MG/10ML: INTRAMUSCULAR | Qty: 1 | Status: AC

## 2021-08-08 MED FILL — Fosaprepitant Dimeglumine For IV Infusion 150 MG (Base Eq): INTRAVENOUS | Qty: 5 | Status: AC

## 2021-08-08 NOTE — Assessment & Plan Note (Signed)
Overall, she tolerated chemotherapy very well Her labs are stable I do not believe her seizure disorder is related to this as it predates the start date of chemotherapy She will proceed with chemotherapy as scheduled and I will see her on a weekly basis for further follow-up

## 2021-08-08 NOTE — Assessment & Plan Note (Signed)
Cause of seizure is unknown I will defer to her neurologist for management

## 2021-08-08 NOTE — Assessment & Plan Note (Signed)
Cause of anemia is likely multifactorial, from blood loss and chemotherapy associated anemia She is not symptomatic I will observe for now

## 2021-08-08 NOTE — Progress Notes (Signed)
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[place]) Overall, she tolerated chemotherapy very well Her labs are stable I do not believe her seizure disorder is related to this as it predates the start date of chemotherapy She will proceed with chemotherapy as scheduled and I will see her on a weekly basis for further follow-up  Iron deficiency anemia due to chronic blood loss Cause of anemia is likely multifactorial, from blood loss and chemotherapy associated anemia She is not symptomatic I will observe for now  Localization-related idiopathic epilepsy and epileptic syndromes with seizures of localized onset, not intractable, without status epilepticus ([place]) Cause of seizure is unknown I will defer to her neurologist for management  No orders of the defined types were placed in this encounter.   All questions were answered. The patient knows to call the clinic with any problems, questions or concerns. The total time spent in the appointment was 20 minutes encounter with patients including review of chart and various tests results, discussions about plan of care and coordination of care plan   [name], MD 08/08/2021 4:11 PM  INTERVAL HISTORY: Please see below for problem oriented charting. she returns for treatment follow-up prior to cycle 2 of cisplatin for cervical cancer She continues to have intermittent vaginal bleeding No further seizures since she saw her neurologist Denies peripheral neuropathy She had very mild constipation after treatment but that resolved She denies nausea  REVIEW OF SYSTEMS:   Constitutional: Denies fevers, chills or abnormal weight loss Eyes: Denies blurriness of vision Ears, nose, mouth, throat, and face: Denies mucositis or sore throat Respiratory: Denies  cough, dyspnea or wheezes Cardiovascular: Denies palpitation, chest discomfort or lower extremity swelling Gastrointestinal:  Denies nausea, heartburn or change in bowel habits Skin: Denies abnormal skin rashes Lymphatics: Denies new lymphadenopathy or easy bruising Neurological:Denies numbness, tingling or new weaknesses Behavioral/Psych: Mood is stable, no new changes  All other systems were reviewed with the patient and are negative.  I have reviewed the past medical history, past surgical history, social history and family history with the patient and they are unchanged from previous note.  ALLERGIES:  has No Known Allergies.  MEDICATIONS:  Current Outpatient Medications  Medication Sig Dispense Refill   ALPRAZolam (XANAX) 0.5 MG tablet Take 1 tablet (0.5 mg total) by mouth at bedtime as needed for anxiety. 90 tablet 1   Bioflavonoid Products (ESTER-C) 500-550 MG TABS Take by mouth.     Brimonidine Tartrate (LUMIFY) 0.025 % SOLN Place 1 drop into both eyes daily as needed (Red eye).     FLUoxetine (PROZAC) 10 MG capsule TAKE ONE CAPSULE BY MOUTH DAILY 90 capsule 0   lamoTRIgine (LAMICTAL) 25 MG tablet Take 1 tablet (25 mg total) by mouth 2 (two) times daily. 180 tablet 3   levETIRAcetam (KEPPRA) 500 MG tablet Take 1 tablet (500 mg total) by mouth 2 (two) times daily. 180 tablet 3   levothyroxine (SYNTHROID) 50 MCG tablet TAKE ONE TABLET BY MOUTH DAILY ON AN EMPTY STOMACH 30 MINUTES BEFORE BREAKFAST 90 tablet 0   lidocaine-prilocaine (EMLA) cream Apply to affected area once 30 g 3   Magnesium 400 MG CAPS Take 400 mg by mouth daily.     olopatadine (PATANOL) 0.1 % ophthalmic solution Place 1 drop into both eyes daily as needed for allergies.  ondansetron (ZOFRAN) 8 MG tablet Take 1 tablet (8 mg total) by mouth every 8 (eight) hours as needed. 30 tablet 1   prochlorperazine (COMPAZINE) 10 MG tablet Take 1 tablet (10 mg total) by mouth every 6 (six) hours as needed for nausea or  vomiting. 30 tablet 1   rosuvastatin (CRESTOR) 10 MG tablet Take 10 mg by mouth daily.     temazepam (RESTORIL) 15 MG capsule Take 15-30 mg by mouth at bedtime as needed for sleep.     zolpidem (AMBIEN) 10 MG tablet TAKE ONE TABLET BY MOUTH EVERY NIGHT AT BEDTIME AS NEEDED 30 tablet 2   No current facility-administered medications for this visit.    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  Due to the abnormal bleeding that she was receiving she was then recommended to follow-up with her gynecologist, Dr. Talbert Nan, who first evaluated the endometrium with an endometrial biopsy performed on 05/20/2021 which revealed benign inactive atrophic endometrium negative for hyperplasia or malignancy.  The bleeding continued and the patient returned for a colposcopic evaluation of the cervix with biopsies (due to the presence of high risk HPV).  This took place on 06/17/2021.  At the time of that colposcopic evaluation there was some abnormal bleeding noted and friability to the cervix.  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   05/20/2021 Pathology Results   FINAL MICROSCOPIC DIAGNOSIS:   A. ENDOMETRIUM, BIOPSY:  - Benign inactive atrophic endometrium  - Negative for hyperplasia or malignancy   05/30/2021 Imaging   US pelvis Small anteverted uterus Thin, symmetrical endometrium Normal ovaries bilaterally No free fluid.   06/17/2021 Pathology Results   FINAL MICROSCOPIC DIAGNOSIS:   A. CERVIX, 6 O'CLOCK, BIOPSY:  -   Invasive squamous cell carcinoma  -  See comment   B. ENDOCERVIX, CURETTAGE:  -  Squamous cell carcinoma  -  See comment   COMMENT:   A.  The carcinoma is positive for p16 and p40 supporting the diagnosis of invasive squamous cell carcinoma.     07/11/2021 PET scan   Intense hypermetabolic activity associated with ill-defined nodular enlargement of the cervix/lower uterine segment, consistent with patient's biopsy proven cervical neoplasm.   No definite scintigraphic evidence of metastatic disease in the neck, chest, abdomen or pelvis.   Right middle lobe 4-5 mm perifissural pulmonary nodule unchanged in size from October 25, 2020 and below the resolution of PET-CT but favored to reflect a peri- fissural lymph node, attention on follow-up studies suggested   Nodular focus of hypermetabolic activity along the right anterior sublingual region, which is nonspecific correlation with direct visualization is suggested.   Hypermetabolic hyperplasia of the tonsils with a hypermetabolic 5 mm left level 1b cervical lymph node, favored reactive.   Hypermetabolic activity associated with callus formation in the right anterior sixth rib likely representing a healing nonpathologic rib fracture.   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 No acute intracranial abnormality. 2. Minimal multifocal hyperintense T2-weight signal within the white matter, nonspecific. This may be seen in the setting of chronic small vessel disease or migraine headaches.     07/25/2021 Imaging   CT head No acute intracranial abnormality.   07/30/2021 Procedure   Successful placement of a power injectable Port-A-Cath via the right internal jugular vein. The  catheter is ready for immediate use.     08/02/2021 -  Chemotherapy   Patient is on Treatment Plan : Cervical Cisplatin q7d       PHYSICAL EXAMINATION: ECOG PERFORMANCE STATUS: 1 - Symptomatic but completely ambulatory  Vitals:   08/08/21 1441  BP: 119/90  Pulse: 85  Resp: 18  Temp: 97.6 F (36.4 C)  SpO2: 100%   Filed Weights   08/08/21 1441  Weight: 170 lb (77.1 kg)    GENERAL:alert, no distress and comfortable SKIN: skin color, texture, turgor are normal, no rashes or significant lesions EYES: normal, Conjunctiva are pink and non-injected, sclera clear OROPHARYNX:no exudate, no erythema and lips, buccal mucosa, and tongue normal  NECK: supple, thyroid normal size, non-tender, without nodularity LYMPH:  no palpable lymphadenopathy in the cervical, axillary or inguinal LUNGS: clear to auscultation and percussion with normal breathing effort HEART: regular rate & rhythm and no murmurs and no lower extremity edema ABDOMEN:abdomen soft, non-tender and normal bowel sounds Musculoskeletal:no cyanosis of digits and no clubbing  NEURO: alert & oriented x 3 with fluent speech, no focal motor/sensory deficits  LABORATORY DATA:  I have reviewed the data as listed    Component Value Date/Time   NA 142 08/07/2021 1434   K 4.2 08/07/2021 1434   CL 107 08/07/2021 1434   CO2 29 08/07/2021 1434   GLUCOSE 92 08/07/2021 1434   BUN 14 08/07/2021 1434   CREATININE 0.71 08/07/2021 1434   CREATININE 0.83 05/07/2021 1216   CALCIUM 9.7 08/07/2021 1434   PROT 7.4 07/25/2021 1725   ALBUMIN 4.5 07/25/2021 1725   AST 31 07/25/2021 1725   ALT 19 07/25/2021 1725   ALKPHOS 70 07/25/2021 1725   BILITOT 0.4 07/25/2021 1725   GFRNONAA >60 08/07/2021 1434   GFRNONAA 80 09/12/2020 1200   GFRAA 93 09/12/2020 1200    No results found for: SPEP, UPEP  Lab Results  Component Value Date   WBC 6.3 08/07/2021   NEUTROABS 4.1 08/07/2021   HGB 11.6 (L) 08/07/2021   HCT 34.4 (L) 08/07/2021    MCV 96.9 08/07/2021   PLT 209 08/07/2021      Chemistry      Component Value Date/Time   NA 142 08/07/2021 1434   K 4.2 08/07/2021 1434   CL 107 08/07/2021 1434   CO2 29 08/07/2021 1434   BUN 14 08/07/2021 1434   CREATININE 0.71 08/07/2021 1434   CREATININE 0.83 05/07/2021 1216      Component Value Date/Time   CALCIUM 9.7 08/07/2021 1434   ALKPHOS 70 07/25/2021 1725   AST 31 07/25/2021 1725   ALT 19 07/25/2021 1725   BILITOT 0.4 07/25/2021 1725       RADIOGRAPHIC STUDIES: I have personally reviewed the radiological images as listed and agreed with the findings in the report. CT HEAD WO CONTRAST (5MM)  Result Date: 07/25/2021 CLINICAL DATA:  Seizure. EXAM: CT HEAD WITHOUT CONTRAST TECHNIQUE: Contiguous axial images were obtained from the base  of the skull through the vertex without intravenous contrast. COMPARISON:  MRI head 10/23/2020. FINDINGS: Brain: No evidence of acute infarction, hemorrhage, hydrocephalus, extra-axial collection or mass lesion/mass effect. Vascular: No hyperdense vessel or unexpected calcification. Skull: Normal. Negative for fracture or focal lesion. Sinuses/Orbits: No acute finding. Other: None. IMPRESSION: No acute intracranial abnormality. Electronically Signed   By: Ronney Asters M.D.   On: 07/25/2021 19:01   MR Brain W and Wo Contrast  Result Date: 07/25/2021 CLINICAL DATA:  Seizure EXAM: MRI HEAD WITHOUT AND WITH CONTRAST TECHNIQUE: Multiplanar, multiecho pulse sequences of the brain and surrounding structures were obtained without and with intravenous contrast. CONTRAST:  13mL GADAVIST GADOBUTROL 1 MMOL/ML IV SOLN COMPARISON:  10/23/2020 FINDINGS: Brain: No acute infarct, mass effect or extra-axial collection. No acute or chronic hemorrhage. Minimal multifocal hyperintense T2-weight signal within the white matter. The midline structures are normal. There is no abnormal contrast enhancement. Vascular: Major flow voids are preserved. Skull and upper  cervical spine: Normal calvarium and skull base. Visualized upper cervical spine and soft tissues are normal. Sinuses/Orbits:No paranasal sinus fluid levels or advanced mucosal thickening. No mastoid or middle ear effusion. Normal orbits. IMPRESSION: 1. No acute intracranial abnormality. 2. Minimal multifocal hyperintense T2-weight signal within the white matter, nonspecific. This may be seen in the setting of chronic small vessel disease or migraine headaches. Electronically Signed   By: Ulyses Jarred M.D.   On: 07/25/2021 23:47   NM PET Image Initial (PI) Skull Base To Thigh (F-18 FDG)  Result Date: 07/12/2021 CLINICAL DATA:  Initial treatment strategy for uterine/cervical cancer. EXAM: NUCLEAR MEDICINE PET SKULL BASE TO THIGH TECHNIQUE: 8.1 mCi F-18 FDG was injected intravenously. Full-ring PET imaging was performed from the skull base to thigh after the radiotracer. CT data was obtained and used for attenuation correction and anatomic localization. Fasting blood glucose: 107 mg/dl COMPARISON:  CT October 25, 2020. FINDINGS: Mediastinal blood pool activity: SUV max 2.47 Liver activity: SUV max NA NECK: Hypermetabolic 5 mm left level 1b lymph node on CT image 42/4 with a max SUV of 3.39. Mild the slightly asymmetric to the right hypermetabolic hyperplasia of the tonsils, favored reactive. Nodular focus of hypermetabolic activity with a max SUV of 5.48 along the right anterior sublingual region on axial fused image 197. Incidental CT findings: Dental hardware. CHEST: No hypermetabolic mediastinal or hilar nodes. Peri-fissural 4-5 mm right middle lobe pulmonary nodule on image 46/8 unchanged in size from CT October 25, 2020 which is below the resolution of PET-CT, nonspecific but favored represent a peri-fissural lymph node no additional suspicious pulmonary nodules or masses. Incidental CT findings: No focal airspace consolidation. Normal caliber aorta and central pulmonary arteries. Normal size heart. No  significant pericardial effusion/thickening. ABDOMEN/PELVIS: Intense hypermetabolic activity associated with ill-defined nodular enlargement of the cervix/left lower uterine segment with a max SUV of 18.08. No abnormal hypermetabolic activity within the liver, pancreas, adrenal glands or spleen. No hypermetabolic adenopathy in the abdomen or pelvis. Incidental CT findings: Unremarkable noncontrast appearance of the liver, pancreas, spleen and kidneys. No evidence of bowel obstruction. Sigmoid colonic diverticulosis without findings of acute diverticulitis. SKELETON: Hypermetabolic activity associated with callus formation in the right anterior sixth rib without associated aggressive lytic or blastic lesion of bone on image 109/4, likely reflecting a healing nonpathologic rib fracture. No definite abnormal hypermetabolic activity to suggest skeletal metastasis. Incidental CT findings: none IMPRESSION: Intense hypermetabolic activity associated with ill-defined nodular enlargement of the cervix/lower uterine segment, consistent with patient's biopsy proven cervical  neoplasm. No definite scintigraphic evidence of metastatic disease in the neck, chest, abdomen or pelvis. Right middle lobe 4-5 mm perifissural pulmonary nodule unchanged in size from October 25, 2020 and below the resolution of PET-CT but favored to reflect a peri- fissural lymph node, attention on follow-up studies suggested Nodular focus of hypermetabolic activity along the right anterior sublingual region, which is nonspecific correlation with direct visualization is suggested. Hypermetabolic hyperplasia of the tonsils with a hypermetabolic 5 mm left level 1b cervical lymph node, favored reactive. Hypermetabolic activity associated with callus formation in the right anterior sixth rib likely representing a healing nonpathologic rib fracture. Electronically Signed   By: Dahlia Bailiff M.D.   On: 07/12/2021 08:51   IR IMAGING GUIDED PORT  INSERTION  Result Date: 07/30/2021 INDICATION: 55 year old female with history of cervical cancer requiring central venous access for chemotherapy administration. EXAM: IMPLANTED PORT A CATH PLACEMENT WITH ULTRASOUND AND FLUOROSCOPIC GUIDANCE COMPARISON:  None. ANESTHESIA/SEDATION: Moderate (conscious) sedation was employed during this procedure. A total of Versed 3 mg and Fentanyl 100 mcg was administered intravenously. Moderate Sedation Time: 22 minutes. The patient's level of consciousness and vital signs were monitored continuously by radiology nursing throughout the procedure under my direct supervision. CONTRAST:  None FLUOROSCOPY TIME:  0 minutes, 18 seconds (1 mGy) COMPLICATIONS: None immediate. PROCEDURE: The procedure, risks, benefits, and alternatives were explained to the patient. Questions regarding the procedure were encouraged and answered. The patient understands and consents to the procedure. The right neck and chest were prepped with chlorhexidine in a sterile fashion, and a sterile drape was applied covering the operative field. Maximum barrier sterile technique with sterile gowns and gloves were used for the procedure. A timeout was performed prior to the initiation of the procedure. Ultrasound was used to examine the jugular vein which was compressible and free of internal echoes. A skin marker was used to demarcate the planned venotomy and port pocket incision sites. Local anesthesia was provided to these sites and the subcutaneous tunnel track with 1% lidocaine with 1:100,000 epinephrine. A small incision was created at the jugular access site and blunt dissection was performed of the subcutaneous tissues. Under ultrasound guidance, the jugular vein was accessed with a 21 ga micropuncture needle and an 0.018" wire was inserted to the superior vena cava. Real-time ultrasound guidance was utilized for vascular access including the acquisition of a permanent ultrasound image documenting patency  of the accessed vessel. A 5 Fr micopuncture set was then used, through which a 0.035" Rosen wire was passed under fluoroscopic guidance into the inferior vena cava. An 8 Fr dilator was then placed over the wire. A subcutaneous port pocket was then created along the upper chest wall utilizing a combination of sharp and blunt dissection. The pocket was irrigated with sterile saline, packed with gauze, and observed for hemorrhage. A single lumen "ISP" sized power injectable port was chosen for placement. The 8 Fr catheter was tunneled from the port pocket site to the venotomy incision. The port was placed in the pocket. The external catheter was trimmed to appropriate length. The dilator was exchanged for an 8 Fr peel-away sheath under fluoroscopic guidance. The catheter was then placed through the sheath and the sheath was removed. Final catheter positioning was confirmed and documented with a fluoroscopic spot radiograph. The port was accessed with a Huber needle, aspirated, and flushed with heparinized saline. The deep dermal layer of the port pocket incision was closed with interrupted 3-0 Vicryl suture. The skin was opposed with  a running subcuticular 4-0 Monocryl suture. Dermabond was then placed over the port pocket and neck incisions. The patient tolerated the procedure well without immediate post procedural complication. FINDINGS: After catheter placement, the tip lies within the superior cavoatrial junction. The catheter aspirates and flushes normally and is ready for immediate use. IMPRESSION: Successful placement of a power injectable Port-A-Cath via the right internal jugular vein. The catheter is ready for immediate use. Ruthann Cancer, MD [facility] Electronically Signed   By: Ruthann Cancer M.D.   On: 07/30/2021 15:57

## 2021-08-09 ENCOUNTER — Ambulatory Visit
Admission: RE | Admit: 2021-08-09 | Discharge: 2021-08-09 | Disposition: A | Discharge status: Home / Self Care | Payer: 59 | Source: Ambulatory Visit | Attending: Radiation Oncology | Admitting: Radiation Oncology

## 2021-08-09 ENCOUNTER — Other Ambulatory Visit: Payer: Self-pay

## 2021-08-09 ENCOUNTER — Inpatient Hospital Stay: Payer: 59

## 2021-08-09 VITALS — BP 110/68 | HR 86 | Temp 98.2°F | Resp 17 | Wt 169.8 lb

## 2021-08-09 DIAGNOSIS — C539 Malignant neoplasm of cervix uteri, unspecified: Secondary | ICD-10-CM

## 2021-08-09 DIAGNOSIS — Z5111 Encounter for antineoplastic chemotherapy: Secondary | ICD-10-CM | POA: Diagnosis not present

## 2021-08-09 MED ORDER — MAGNESIUM SULFATE 2 GM/50ML IV SOLN: 2.0000 g | Freq: Once | INTRAVENOUS | Status: AC

## 2021-08-09 MED ORDER — SODIUM CHLORIDE 0.9 % IV SOLN
150.0000 mg | Freq: Once | INTRAVENOUS | Status: AC
  Administered 2021-08-09: 150 mg via INTRAVENOUS
  Filled 2021-08-09: qty 150

## 2021-08-09 MED ORDER — POTASSIUM CHLORIDE IN NACL 20-0.9 MEQ/L-% IV SOLN
Freq: Once | INTRAVENOUS | Status: AC
  Filled 2021-08-09: qty 1000

## 2021-08-09 MED ORDER — HEPARIN SOD (PORK) LOCK FLUSH 100 UNIT/ML IV SOLN
500.0000 [IU] | Freq: Once | INTRAVENOUS | Status: AC | PRN
  Administered 2021-08-09: 500 [IU]

## 2021-08-09 MED ORDER — PALONOSETRON HCL INJECTION 0.25 MG/5ML: 0.2500 mg | Freq: Once | INTRAVENOUS | Status: AC

## 2021-08-09 MED ORDER — SODIUM CHLORIDE 0.9 % IV SOLN
10.0000 mg | Freq: Once | INTRAVENOUS | Status: AC
  Administered 2021-08-09: 10 mg via INTRAVENOUS
  Filled 2021-08-09: qty 10

## 2021-08-09 MED ORDER — MAGNESIUM SULFATE 2 GM/50ML IV SOLN
INTRAVENOUS | Status: AC
  Administered 2021-08-09: 2 g via INTRAVENOUS
  Filled 2021-08-09: qty 50

## 2021-08-09 MED ORDER — SODIUM CHLORIDE 0.9% FLUSH
10.0000 mL | INTRAVENOUS | Status: DC | PRN
  Administered 2021-08-09: 10 mL

## 2021-08-09 MED ORDER — PALONOSETRON HCL INJECTION 0.25 MG/5ML
INTRAVENOUS | Status: AC
  Administered 2021-08-09: 0.25 mg via INTRAVENOUS
  Filled 2021-08-09: qty 5

## 2021-08-09 MED ORDER — SODIUM CHLORIDE 0.9 % IV SOLN: Freq: Once | INTRAVENOUS | Status: AC

## 2021-08-09 MED ORDER — SODIUM CHLORIDE 0.9 % IV SOLN
40.0000 mg/m2 | Freq: Once | INTRAVENOUS | Status: AC
  Administered 2021-08-09: 78 mg via INTRAVENOUS
  Filled 2021-08-09: qty 78

## 2021-08-09 NOTE — Patient Instructions (Signed)
[facility] MEDICAL ONCOLOGY  Discharge Instructions: Thank you for choosing [facility] to provide your oncology and hematology care.   If you have a lab appointment with the Cancer Center, please go directly to the Cancer Center and check in at the registration area.   Wear comfortable clothing and clothing appropriate for easy access to any Portacath or PICC line.   We strive to give you quality time with your provider. You may need to reschedule your appointment if you arrive late (15 or more minutes).  Arriving late affects you and other patients whose appointments are after yours.  Also, if you miss three or more appointments without notifying the office, you may be dismissed from the clinic at the provider's discretion.      For prescription refill requests, have your pharmacy contact our office and allow 72 hours for refills to be completed.    Today you received the following chemotherapy and/or immunotherapy agents : Cisplatin    To help prevent nausea and vomiting after your treatment, we encourage you to take your nausea medication as directed.  BELOW ARE SYMPTOMS THAT SHOULD BE REPORTED IMMEDIATELY: *FEVER GREATER THAN 100.4 F (38 C) OR HIGHER *CHILLS OR SWEATING *NAUSEA AND VOMITING THAT IS NOT CONTROLLED WITH YOUR NAUSEA MEDICATION *UNUSUAL SHORTNESS OF BREATH *UNUSUAL BRUISING OR BLEEDING *URINARY PROBLEMS (pain or burning when urinating, or frequent urination) *BOWEL PROBLEMS (unusual diarrhea, constipation, pain near the anus) TENDERNESS IN MOUTH AND THROAT WITH OR WITHOUT PRESENCE OF ULCERS (sore throat, sores in mouth, or a toothache) UNUSUAL RASH, SWELLING OR PAIN  UNUSUAL VAGINAL DISCHARGE OR ITCHING   Items with * indicate a potential emergency and should be followed up as soon as possible or go to the Emergency Department if any problems should occur.  Please show the CHEMOTHERAPY ALERT CARD or IMMUNOTHERAPY ALERT CARD at check-in to  the Emergency Department and triage nurse.  Should you have questions after your visit or need to cancel or reschedule your appointment, please contact [facility] MEDICAL ONCOLOG[facility]  Dept: [phone]  and follow the prompts.  Office hours are 8:00 a.m. to 4:30 p.m. Monday - Friday. Please note that voicemails left after 4:00 p.m. may not be returned until the following business day.  We are closed weekends and major holidays. You have access to a nurse at all times for urgent questions. Please call the main number to the clinic Dept: [phone] and follow the prompts.   For any non-urgent questions, you may also contact your provider using MyChart. We now offer e-Visits for anyone 18 and older to request care online for non-urgent symptoms. For details visit [email or web address].   Also download the MyChart app! Go to the app store, search "MyChart", open the app, select [facility], and log in with your MyChart username and password.  Due to Covid, a mask is required upon entering the hospital/clinic. If you do not have a mask, one will be given to you upon arrival. For doctor visits, patients may have 1 support person aged 18 or older with them. For treatment visits, patients cannot have anyone with them due to current Covid guidelines and our immunocompromised population.   

## 2021-08-12 ENCOUNTER — Other Ambulatory Visit: Payer: Self-pay

## 2021-08-12 ENCOUNTER — Ambulatory Visit (INDEPENDENT_AMBULATORY_CARE_PROVIDER_SITE_OTHER): Payer: 59 | Admitting: Neurology

## 2021-08-12 ENCOUNTER — Ambulatory Visit
Admission: RE | Admit: 2021-08-12 | Discharge: 2021-08-12 | Disposition: A | Discharge status: Home / Self Care | Payer: 59 | Source: Ambulatory Visit | Attending: Radiation Oncology | Admitting: Radiation Oncology

## 2021-08-12 DIAGNOSIS — C538 Malignant neoplasm of overlapping sites of cervix uteri: Secondary | ICD-10-CM

## 2021-08-12 DIAGNOSIS — D500 Iron deficiency anemia secondary to blood loss (chronic): Secondary | ICD-10-CM

## 2021-08-12 DIAGNOSIS — Z5111 Encounter for antineoplastic chemotherapy: Secondary | ICD-10-CM | POA: Diagnosis not present

## 2021-08-12 DIAGNOSIS — G40009 Localization-related (focal) (partial) idiopathic epilepsy and epileptic syndromes with seizures of localized onset, not intractable, without status epilepticus: Secondary | ICD-10-CM | POA: Diagnosis not present

## 2021-08-12 NOTE — Procedures (Signed)
    History:  60 yof with seizure  EEG classification: Normal awake and drowsy  Description of the recording: The background rhythms of this recording consists of a fairly well modulated medium amplitude alpha rhythm of 9 Hz that is reactive to eye opening and closure. As the record progresses, the patient appears to remain in the waking state throughout the recording. Photic stimulation was performed, did not show any abnormalities. Hyperventilation was also performed, did not show any abnormalities. Toward the end of the recording, the patient enters the drowsy state with slight symmetric slowing seen. The patient never enters stage II sleep. No abnormal epileptiform discharges seen during this recording. There was no focal slowing. There is presence of Frontal intermittent rhythmic delta activity 2.5-3.5 Hz. EKG monitor shows no evidence of cardiac rhythm abnormalities with a heart rate of 66.  Impression: This is an abnormal EEG recording in the waking and drowsy state due to presence of Frontal Intermittent Rhythmic Delta Activity. No evidence of interictal epileptiform discharges seen. FIRDA can be seen in cerebral dysfcuntion of non specific etiology.      Alric Ran, MD [facility]

## 2021-08-12 NOTE — Progress Notes (Signed)
Impression: This is an abnormal EEG recording in the waking and drowsy state due to presence of Frontal Intermittent Rhythmic Delta Activity. No evidence of interictal epileptiform discharges seen. FIRDA can be seen in cerebral dysfcuntion of non specific etiology.

## 2021-08-13 ENCOUNTER — Ambulatory Visit
Admission: RE | Admit: 2021-08-13 | Discharge: 2021-08-13 | Disposition: A | Discharge status: Home / Self Care | Payer: 59 | Source: Ambulatory Visit | Attending: Radiation Oncology | Admitting: Radiation Oncology

## 2021-08-13 ENCOUNTER — Telehealth: Payer: Self-pay | Admitting: *Deleted

## 2021-08-13 DIAGNOSIS — Z5111 Encounter for antineoplastic chemotherapy: Secondary | ICD-10-CM | POA: Diagnosis not present

## 2021-08-13 NOTE — Telephone Encounter (Signed)
-----   Message from Larey Seat, MD sent at 08/12/2021  6:18 PM EDT ----- Impression: This is an abnormal EEG recording in the waking and drowsy state due to presence of Frontal Intermittent Rhythmic Delta Activity. No evidence of interictal epileptiform discharges seen. FIRDA can be seen in cerebral dysfcuntion of non specific etiology.

## 2021-08-13 NOTE — Telephone Encounter (Signed)
Called pt. Went over results of EEG per Dr. Brett Fairy. She verbalized understanding. She will continue current meds as prescribed.  She is waiting to hear to schedule HST. Aware sleep lab will be reaching out soon to schedule. We will call w/ results and then set up f/u at that point. She verbalized understanding.

## 2021-08-14 ENCOUNTER — Ambulatory Visit
Admission: RE | Admit: 2021-08-14 | Discharge: 2021-08-14 | Disposition: A | Discharge status: Home / Self Care | Payer: 59 | Source: Ambulatory Visit | Attending: Radiation Oncology | Admitting: Radiation Oncology

## 2021-08-14 ENCOUNTER — Inpatient Hospital Stay: Payer: 59

## 2021-08-14 ENCOUNTER — Other Ambulatory Visit: Payer: Self-pay

## 2021-08-14 DIAGNOSIS — Z5111 Encounter for antineoplastic chemotherapy: Secondary | ICD-10-CM | POA: Diagnosis not present

## 2021-08-14 DIAGNOSIS — C539 Malignant neoplasm of cervix uteri, unspecified: Secondary | ICD-10-CM

## 2021-08-14 LAB — BASIC METABOLIC PANEL - CANCER CENTER ONLY
Anion gap: 14 (ref 5–15)
BUN: 11 mg/dL (ref 6–20)
CO2: 22 mmol/L (ref 22–32)
Calcium: 9.6 mg/dL (ref 8.9–10.3)
Chloride: 103 mmol/L (ref 98–111)
Creatinine: 0.79 mg/dL (ref 0.44–1.00)
GFR, Estimated: >60 mL/min (ref >60)
Glucose, Bld: 106 mg/dL — ABNORMAL HIGH (ref 70–99)
Potassium: 4.3 mmol/L (ref 3.5–5.1)
Sodium: 139 mmol/L (ref 135–145)

## 2021-08-14 LAB — CBC WITH DIFFERENTIAL (CANCER CENTER ONLY)
Abs Immature Granulocytes: 0.01 10*3/uL (ref 0.00–0.07)
Basophils Absolute: 0 10*3/uL (ref 0.0–0.1)
Basophils Relative: 0 %
Eosinophils Absolute: 0.4 10*3/uL (ref 0.0–0.5)
Eosinophils Relative: 8 %
HCT: 33.7 % — ABNORMAL LOW (ref 36.0–46.0)
Hemoglobin: 11.7 g/dL — ABNORMAL LOW (ref 12.0–15.0)
Immature Granulocytes: 0 %
Lymphocytes Relative: 10 %
Lymphs Abs: 0.6 10*3/uL — ABNORMAL LOW (ref 0.7–4.0)
MCH: 32.8 pg (ref 26.0–34.0)
MCHC: 34.7 g/dL (ref 30.0–36.0)
MCV: 94.4 fL (ref 80.0–100.0)
Monocytes Absolute: 0.4 10*3/uL (ref 0.1–1.0)
Monocytes Relative: 7 %
Neutro Abs: 4.2 10*3/uL (ref 1.7–7.7)
Neutrophils Relative %: 75 %
Platelet Count: 143 10*3/uL — ABNORMAL LOW (ref 150–400)
RBC: 3.57 MIL/uL — ABNORMAL LOW (ref 3.87–5.11)
RDW: 11.7 % (ref 11.5–15.5)
WBC Count: 5.7 10*3/uL (ref 4.0–10.5)
nRBC: 0 % (ref 0.0–0.2)

## 2021-08-14 LAB — MAGNESIUM: Magnesium: 1.6 mg/dL — ABNORMAL LOW (ref 1.7–2.4)

## 2021-08-14 NOTE — Progress Notes (Signed)
Pt here for patient teaching. Pt given [facility] You booklet and skin care instructions.  Reviewed areas of pertinence such as diarrhea, fatigue, hair loss, nausea and vomiting, sexual and fertility changes, skin changes, and urinary and bladder changes. Pt able to give teach back of to pat skin, use unscented/gentle soap, use baby wipes, have Imodium on hand, drink plenty of water, and sitz bath, avoid applying anything to skin within 4 hours of treatment and to use an electric razor if they must shave. Pt verbalizes understanding of information given and will contact nursing with any questions or concerns.     [email or web address]      

## 2021-08-15 ENCOUNTER — Encounter: Payer: Self-pay | Admitting: Hematology and Oncology

## 2021-08-15 ENCOUNTER — Other Ambulatory Visit: Payer: Self-pay | Admitting: Radiation Oncology

## 2021-08-15 ENCOUNTER — Other Ambulatory Visit: Payer: Self-pay | Admitting: Internal Medicine

## 2021-08-15 ENCOUNTER — Other Ambulatory Visit (HOSPITAL_COMMUNITY): Payer: Self-pay

## 2021-08-15 ENCOUNTER — Inpatient Hospital Stay (HOSPITAL_BASED_OUTPATIENT_CLINIC_OR_DEPARTMENT_OTHER): Payer: 59 | Admitting: Hematology and Oncology

## 2021-08-15 ENCOUNTER — Other Ambulatory Visit (HOSPITAL_COMMUNITY): Payer: Self-pay | Admitting: Radiation Oncology

## 2021-08-15 ENCOUNTER — Ambulatory Visit
Admission: RE | Admit: 2021-08-15 | Discharge: 2021-08-15 | Disposition: A | Discharge status: Home / Self Care | Payer: 59 | Source: Ambulatory Visit | Attending: Radiation Oncology | Admitting: Radiation Oncology

## 2021-08-15 DIAGNOSIS — D61818 Other pancytopenia: Secondary | ICD-10-CM | POA: Diagnosis not present

## 2021-08-15 DIAGNOSIS — R197 Diarrhea, unspecified: Secondary | ICD-10-CM | POA: Diagnosis not present

## 2021-08-15 DIAGNOSIS — C539 Malignant neoplasm of cervix uteri, unspecified: Secondary | ICD-10-CM

## 2021-08-15 DIAGNOSIS — Z5111 Encounter for antineoplastic chemotherapy: Secondary | ICD-10-CM | POA: Diagnosis not present

## 2021-08-15 MED ORDER — DIPHENOXYLATE-ATROPINE 2.5-0.025 MG PO TABS
1.0000 | ORAL_TABLET | Freq: Four times a day (QID) | ORAL | 0 refills | Status: DC | PRN
  Filled 2021-08-15: qty 30, 8d supply, fill #0

## 2021-08-15 MED FILL — Fosaprepitant Dimeglumine For IV Infusion 150 MG (Base Eq): INTRAVENOUS | Qty: 5 | Status: AC

## 2021-08-15 MED FILL — Dexamethasone Sodium Phosphate Inj 100 MG/10ML: INTRAMUSCULAR | Qty: 1 | Status: AC

## 2021-08-15 NOTE — Assessment & Plan Note (Signed)
I recommend she take scheduled Imodium If she develop constipation, she will stop scheduled Imodium and change it to as needed If her diarrhea is not controlled by Imodium, and we will prescribe also Lomotil for her to take as needed

## 2021-08-15 NOTE — Assessment & Plan Note (Signed)
Overall, she tolerated chemotherapy very well except for severe diarrhea  her labs are stable She will proceed with chemotherapy as scheduled and I will see her on a weekly basis for further follow-up

## 2021-08-15 NOTE — Progress Notes (Signed)
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[place]) Overall, she tolerated chemotherapy very well except for severe diarrhea  her labs are stable She will proceed with chemotherapy as scheduled and I will see her on a weekly basis for further follow-up  Diarrhea I recommend she take scheduled Imodium If she develop constipation, she will stop scheduled Imodium and change it to as needed If her diarrhea is not controlled by Imodium, and we will prescribe also Lomotil for her to take as needed  Pancytopenia, acquired ([place]) Her mild pancytopenia is due to treatment She is not symptomatic We will observe closely  No orders of the defined types were placed in this encounter.   All questions were answered. The patient knows to call the clinic with any problems, questions or concerns. The total time spent in the appointment was 20 minutes encounter with patients including review of chart and various tests results, discussions about plan of care and coordination of care plan   [name], MD 08/15/2021 4:35 PM  INTERVAL HISTORY: Please see below for problem oriented charting. she returns for treatment follow-up seen prior to week #3 of treatment with cisplatin for cervical cancer She developed severe diarrhea requiring Imodium Denies recent vaginal bleeding No recent infection Denies nausea or peripheral neuropathy  REVIEW OF SYSTEMS:   Constitutional: Denies fevers, chills or abnormal weight loss Eyes: Denies blurriness of vision Ears, nose, mouth, throat, and face: Denies mucositis or sore throat Respiratory: Denies cough, dyspnea or wheezes Cardiovascular: Denies palpitation, chest discomfort or lower extremity swelling Skin: Denies abnormal skin rashes Lymphatics: Denies new lymphadenopathy  or easy bruising Neurological:Denies numbness, tingling or new weaknesses Behavioral/Psych: Mood is stable, no new changes  All other systems were reviewed with the patient and are negative.  I have reviewed the past medical history, past surgical history, social history and family history with the patient and they are unchanged from previous note.  ALLERGIES:  has No Known Allergies.  MEDICATIONS:  Current Outpatient Medications  Medication Sig Dispense Refill   diphenoxylate-atropine (LOMOTIL) 2.5-0.025 MG tablet Take 1 tablet by mouth 4 (four) times daily as needed for diarrhea or loose stools. 30 tablet 0   ALPRAZolam (XANAX) 0.5 MG tablet Take 1 tablet (0.5 mg total) by mouth at bedtime as needed for anxiety. 90 tablet 1   Bioflavonoid Products (ESTER-C) 500-550 MG TABS Take by mouth.     Brimonidine Tartrate (LUMIFY) 0.025 % SOLN Place 1 drop into both eyes daily as needed (Red eye).     FLUoxetine (PROZAC) 10 MG capsule TAKE ONE CAPSULE BY MOUTH DAILY 90 capsule 0   lamoTRIgine (LAMICTAL) 25 MG tablet Take 1 tablet (25 mg total) by mouth 2 (two) times daily. 180 tablet 3   levETIRAcetam (KEPPRA) 500 MG tablet Take 1 tablet (500 mg total) by mouth 2 (two) times daily. 180 tablet 3   levothyroxine (SYNTHROID) 50 MCG tablet TAKE ONE TABLET BY MOUTH DAILY ON AN EMPTY STOMACH 30 MINUTES BEFORE BREAKFAST 90 tablet 0   lidocaine-prilocaine (EMLA) cream Apply to affected area once 30 g 3   Magnesium 400 MG CAPS Take 400 mg by mouth daily.     olopatadine (PATANOL) 0.1 % ophthalmic solution Place 1 drop into both eyes daily as needed for allergies.     ondansetron (ZOFRAN) 8 MG  tablet Take 1 tablet (8 mg total) by mouth every 8 (eight) hours as needed. 30 tablet 1   prochlorperazine (COMPAZINE) 10 MG tablet Take 1 tablet (10 mg total) by mouth every 6 (six) hours as needed for nausea or vomiting. 30 tablet 1   rosuvastatin (CRESTOR) 10 MG tablet Take 10 mg by mouth daily.     zolpidem  (AMBIEN) 10 MG tablet TAKE ONE TABLET BY MOUTH EVERY NIGHT AT BEDTIME AS NEEDED 30 tablet 2   No current facility-administered medications for this visit.    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  Due to the abnormal bleeding that she was receiving she was then recommended to follow-up with her gynecologist, Dr. Talbert Nan, who first evaluated the endometrium with an endometrial biopsy performed on 05/20/2021 which revealed benign inactive atrophic endometrium negative for hyperplasia or malignancy.  The bleeding continued and the patient returned for a colposcopic evaluation of the cervix with biopsies (due to the presence of high risk HPV).  This took place on 06/17/2021.  At the time of that colposcopic evaluation there was some abnormal bleeding noted and friability to the cervix.  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   05/20/2021 Pathology Results   FINAL MICROSCOPIC DIAGNOSIS:   A. ENDOMETRIUM, BIOPSY:  - Benign inactive atrophic endometrium  - Negative for hyperplasia or malignancy   05/30/2021 Imaging   US pelvis Small anteverted uterus Thin, symmetrical endometrium Normal ovaries bilaterally No free fluid.   06/17/2021 Pathology Results   FINAL MICROSCOPIC DIAGNOSIS:   A. CERVIX, 6 O'CLOCK, BIOPSY:  -  Invasive squamous cell carcinoma  -  See comment   B. ENDOCERVIX, CURETTAGE:  -  Squamous cell carcinoma  -  See comment   COMMENT:   A.  The carcinoma is positive for p16 and p40 supporting the  diagnosis of invasive squamous cell carcinoma.     07/11/2021 PET scan   Intense hypermetabolic activity associated with ill-defined nodular enlargement of the cervix/lower uterine segment, consistent with patient's biopsy proven cervical neoplasm.   No definite scintigraphic evidence of metastatic disease in the neck, chest, abdomen or pelvis.   Right middle lobe 4-5 mm perifissural pulmonary nodule unchanged in size from October 25, 2020 and below the resolution of PET-CT but favored to reflect a peri- fissural lymph node, attention on follow-up studies suggested   Nodular focus of hypermetabolic activity along the right anterior sublingual region, which is nonspecific correlation with direct visualization is suggested.   Hypermetabolic hyperplasia of the tonsils with a hypermetabolic 5 mm left level 1b cervical lymph node, favored reactive.   Hypermetabolic activity associated with callus formation in the right anterior sixth rib likely representing a healing nonpathologic rib fracture.   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  No acute intracranial abnormality. 2. Minimal multifocal hyperintense T2-weight signal within the white matter, nonspecific. This may be seen in the setting of chronic small vessel disease or migraine headaches.     07/25/2021 Imaging   CT head No acute intracranial abnormality.   07/30/2021 Procedure   Successful placement of a power injectable Port-A-Cath via the right internal jugular vein. The catheter is ready for immediate use.     08/02/2021 -  Chemotherapy   Patient is on Treatment Plan : Cervical Cisplatin q7d       PHYSICAL EXAMINATION: ECOG PERFORMANCE STATUS: 1 - Symptomatic but  completely ambulatory  Vitals:   08/15/21 1450  BP: 138/79  Pulse: 89  Resp: 18  SpO2: 100%   Filed Weights   08/15/21 1450  Weight: 165 lb 12.8 oz (75.2 kg)    GENERAL:alert, no distress and comfortable SKIN: skin color, texture, turgor are normal, no rashes or significant lesions EYES: normal, Conjunctiva are pink and non-injected, sclera clear OROPHARYNX:no exudate, no erythema and lips, buccal mucosa, and tongue normal  NECK: supple, thyroid normal size, non-tender, without nodularity LYMPH:  no palpable lymphadenopathy in the cervical, axillary or inguinal LUNGS: clear to auscultation and percussion with normal breathing effort HEART: regular rate & rhythm and no murmurs and no lower extremity edema ABDOMEN:abdomen soft, non-tender and normal bowel sounds Musculoskeletal:no cyanosis of digits and no clubbing  NEURO: alert & oriented x 3 with fluent speech, no focal motor/sensory deficits  LABORATORY DATA:  I have reviewed the data as listed    Component Value Date/Time   NA 139 08/14/2021 1425   K 4.3 08/14/2021 1425   CL 103 08/14/2021 1425   CO2 22 08/14/2021 1425   GLUCOSE 106 (H) 08/14/2021 1425   BUN 11 08/14/2021 1425   CREATININE 0.79 08/14/2021 1425   CREATININE 0.83 05/07/2021 1216   CALCIUM 9.6 08/14/2021 1425   PROT 7.4 07/25/2021 1725   ALBUMIN 4.5 07/25/2021 1725   AST 31 07/25/2021 1725   ALT 19 07/25/2021 1725   ALKPHOS 70 07/25/2021 1725   BILITOT 0.4 07/25/2021 1725   GFRNONAA >60 08/14/2021 1425   GFRNONAA 80 09/12/2020 1200   GFRAA 93 09/12/2020 1200    No results found for: SPEP, UPEP  Lab Results  Component Value Date   WBC 5.7 08/14/2021   NEUTROABS 4.2 08/14/2021   HGB 11.7 (L) 08/14/2021   HCT 33.7 (L) 08/14/2021   MCV 94.4 08/14/2021   PLT 143 (L) 08/14/2021      Chemistry      Component Value Date/Time   NA 139 08/14/2021 1425   K 4.3 08/14/2021 1425   CL 103 08/14/2021 1425   CO2 22 08/14/2021 1425   BUN 11  08/14/2021 1425   CREATININE 0.79 08/14/2021 1425   CREATININE 0.83 05/07/2021 1216      Component Value Date/Time   CALCIUM 9.6 08/14/2021 1425   ALKPHOS 70 07/25/2021 1725   AST 31 07/25/2021 1725   ALT 19 07/25/2021 1725   BILITOT 0.4 07/25/2021 1725       RADIOGRAPHIC STUDIES: I have personally reviewed the radiological images as listed and agreed with the findings in the report. CT HEAD WO CONTRAST (5MM)  Result Date: 07/25/2021 CLINICAL DATA:  Seizure. EXAM: CT HEAD WITHOUT CONTRAST TECHNIQUE: Contiguous axial images were obtained from the base of the skull through the vertex without intravenous contrast. COMPARISON:  MRI head 10/23/2020. FINDINGS: Brain: No evidence of acute infarction, hemorrhage, hydrocephalus, extra-axial collection or  mass lesion/mass effect. Vascular: No hyperdense vessel or unexpected calcification. Skull: Normal. Negative for fracture or focal lesion. Sinuses/Orbits: No acute finding. Other: None. IMPRESSION: No acute intracranial abnormality. Electronically Signed   By: Ronney Asters M.D.   On: 07/25/2021 19:01   MR Brain W and Wo Contrast  Result Date: 07/25/2021 CLINICAL DATA:  Seizure EXAM: MRI HEAD WITHOUT AND WITH CONTRAST TECHNIQUE: Multiplanar, multiecho pulse sequences of the brain and surrounding structures were obtained without and with intravenous contrast. CONTRAST:  80mL GADAVIST GADOBUTROL 1 MMOL/ML IV SOLN COMPARISON:  10/23/2020 FINDINGS: Brain: No acute infarct, mass effect or extra-axial collection. No acute or chronic hemorrhage. Minimal multifocal hyperintense T2-weight signal within the white matter. The midline structures are normal. There is no abnormal contrast enhancement. Vascular: Major flow voids are preserved. Skull and upper cervical spine: Normal calvarium and skull base. Visualized upper cervical spine and soft tissues are normal. Sinuses/Orbits:No paranasal sinus fluid levels or advanced mucosal thickening. No mastoid or middle ear  effusion. Normal orbits. IMPRESSION: 1. No acute intracranial abnormality. 2. Minimal multifocal hyperintense T2-weight signal within the white matter, nonspecific. This may be seen in the setting of chronic small vessel disease or migraine headaches. Electronically Signed   By: Ulyses Jarred M.D.   On: 07/25/2021 23:47   EEG adult  Result Date: 08/12/2021 Alric Ran, MD     08/12/2021  6:14 PM History: 76 yof with seizure EEG classification: Normal awake and drowsy Description of the recording: The background rhythms of this recording consists of a fairly well modulated medium amplitude alpha rhythm of 9 Hz that is reactive to eye opening and closure. As the record progresses, the patient appears to remain in the waking state throughout the recording. Photic stimulation was performed, did not show any abnormalities. Hyperventilation was also performed, did not show any abnormalities. Toward the end of the recording, the patient enters the drowsy state with slight symmetric slowing seen. The patient never enters stage II sleep. No abnormal epileptiform discharges seen during this recording. There was no focal slowing. There is presence of Frontal intermittent rhythmic delta activity 2.5-3.5 Hz. EKG monitor shows no evidence of cardiac rhythm abnormalities with a heart rate of 66. Impression: This is an abnormal EEG recording in the waking and drowsy state due to presence of Frontal Intermittent Rhythmic Delta Activity. No evidence of interictal epileptiform discharges seen. FIRDA can be seen in cerebral dysfcuntion of non specific etiology.  Alric Ran, MD [facility]    IR IMAGING GUIDED PORT INSERTION  Result Date: 07/30/2021 INDICATION: 55 year old female with history of cervical cancer requiring central venous access for chemotherapy administration. EXAM: IMPLANTED PORT A CATH PLACEMENT WITH ULTRASOUND AND FLUOROSCOPIC GUIDANCE COMPARISON:  None. ANESTHESIA/SEDATION: Moderate  (conscious) sedation was employed during this procedure. A total of Versed 3 mg and Fentanyl 100 mcg was administered intravenously. Moderate Sedation Time: 22 minutes. The patient's level of consciousness and vital signs were monitored continuously by radiology nursing throughout the procedure under my direct supervision. CONTRAST:  None FLUOROSCOPY TIME:  0 minutes, 18 seconds (1 mGy) COMPLICATIONS: None immediate. PROCEDURE: The procedure, risks, benefits, and alternatives were explained to the patient. Questions regarding the procedure were encouraged and answered. The patient understands and consents to the procedure. The right neck and chest were prepped with chlorhexidine in a sterile fashion, and a sterile drape was applied covering the operative field. Maximum barrier sterile technique with sterile gowns and gloves were used for the procedure. A timeout was performed prior  to the initiation of the procedure. Ultrasound was used to examine the jugular vein which was compressible and free of internal echoes. A skin marker was used to demarcate the planned venotomy and port pocket incision sites. Local anesthesia was provided to these sites and the subcutaneous tunnel track with 1% lidocaine with 1:100,000 epinephrine. A small incision was created at the jugular access site and blunt dissection was performed of the subcutaneous tissues. Under ultrasound guidance, the jugular vein was accessed with a 21 ga micropuncture needle and an 0.018" wire was inserted to the superior vena cava. Real-time ultrasound guidance was utilized for vascular access including the acquisition of a permanent ultrasound image documenting patency of the accessed vessel. A 5 Fr micopuncture set was then used, through which a 0.035" Rosen wire was passed under fluoroscopic guidance into the inferior vena cava. An 8 Fr dilator was then placed over the wire. A subcutaneous port pocket was then created along the upper chest wall utilizing  a combination of sharp and blunt dissection. The pocket was irrigated with sterile saline, packed with gauze, and observed for hemorrhage. A single lumen "ISP" sized power injectable port was chosen for placement. The 8 Fr catheter was tunneled from the port pocket site to the venotomy incision. The port was placed in the pocket. The external catheter was trimmed to appropriate length. The dilator was exchanged for an 8 Fr peel-away sheath under fluoroscopic guidance. The catheter was then placed through the sheath and the sheath was removed. Final catheter positioning was confirmed and documented with a fluoroscopic spot radiograph. The port was accessed with a Huber needle, aspirated, and flushed with heparinized saline. The deep dermal layer of the port pocket incision was closed with interrupted 3-0 Vicryl suture. The skin was opposed with a running subcuticular 4-0 Monocryl suture. Dermabond was then placed over the port pocket and neck incisions. The patient tolerated the procedure well without immediate post procedural complication. FINDINGS: After catheter placement, the tip lies within the superior cavoatrial junction. The catheter aspirates and flushes normally and is ready for immediate use. IMPRESSION: Successful placement of a power injectable Port-A-Cath via the right internal jugular vein. The catheter is ready for immediate use. Ruthann Cancer, MD [facility] Radiology Electronically Signed   By: Ruthann Cancer M.D.   On: 07/30/2021 15:57

## 2021-08-15 NOTE — Assessment & Plan Note (Signed)
Her mild pancytopenia is due to treatment She is not symptomatic We will observe closely

## 2021-08-16 ENCOUNTER — Inpatient Hospital Stay: Payer: 59

## 2021-08-16 ENCOUNTER — Other Ambulatory Visit: Payer: Self-pay

## 2021-08-16 ENCOUNTER — Ambulatory Visit
Admission: RE | Admit: 2021-08-16 | Discharge: 2021-08-16 | Disposition: A | Discharge status: Home / Self Care | Payer: 59 | Source: Ambulatory Visit | Attending: Radiation Oncology | Admitting: Radiation Oncology

## 2021-08-16 VITALS — BP 106/68 | HR 79 | Temp 98.9°F | Resp 18 | Wt 167.4 lb

## 2021-08-16 DIAGNOSIS — C539 Malignant neoplasm of cervix uteri, unspecified: Secondary | ICD-10-CM

## 2021-08-16 DIAGNOSIS — Z5111 Encounter for antineoplastic chemotherapy: Secondary | ICD-10-CM | POA: Diagnosis not present

## 2021-08-16 MED ORDER — SODIUM CHLORIDE 0.9% FLUSH
10.0000 mL | INTRAVENOUS | Status: DC | PRN
  Administered 2021-08-16: 10 mL

## 2021-08-16 MED ORDER — HEPARIN SOD (PORK) LOCK FLUSH 100 UNIT/ML IV SOLN
500.0000 [IU] | Freq: Once | INTRAVENOUS | Status: AC | PRN
  Administered 2021-08-16: 500 [IU]

## 2021-08-16 MED ORDER — SODIUM CHLORIDE 0.9 % IV SOLN
150.0000 mg | Freq: Once | INTRAVENOUS | Status: AC
  Administered 2021-08-16: 150 mg via INTRAVENOUS
  Filled 2021-08-16: qty 150

## 2021-08-16 MED ORDER — SODIUM CHLORIDE 0.9 % IV SOLN
40.0000 mg/m2 | Freq: Once | INTRAVENOUS | Status: AC
  Administered 2021-08-16: 78 mg via INTRAVENOUS
  Filled 2021-08-16: qty 78

## 2021-08-16 MED ORDER — SODIUM CHLORIDE 0.9 % IV SOLN
10.0000 mg | Freq: Once | INTRAVENOUS | Status: AC
  Administered 2021-08-16: 10 mg via INTRAVENOUS
  Filled 2021-08-16: qty 10

## 2021-08-16 MED ORDER — SODIUM CHLORIDE 0.9 % IV SOLN: Freq: Once | INTRAVENOUS | Status: AC

## 2021-08-16 MED ORDER — POTASSIUM CHLORIDE IN NACL 20-0.9 MEQ/L-% IV SOLN
Freq: Once | INTRAVENOUS | Status: AC
Start: 2021-08-16 — End: 2021-08-16
  Filled 2021-08-16: qty 1000

## 2021-08-16 MED ORDER — MAGNESIUM SULFATE 2 GM/50ML IV SOLN
2.0000 g | Freq: Once | INTRAVENOUS | Status: AC
  Administered 2021-08-16: 2 g via INTRAVENOUS
  Filled 2021-08-16: qty 50

## 2021-08-16 MED ORDER — PALONOSETRON HCL INJECTION 0.25 MG/5ML
0.2500 mg | Freq: Once | INTRAVENOUS | Status: AC
  Administered 2021-08-16: 0.25 mg via INTRAVENOUS
  Filled 2021-08-16: qty 5

## 2021-08-16 NOTE — Patient Instructions (Signed)
[facility] MEDICAL ONCOLOGY  Discharge Instructions: Thank you for choosing [facility] to provide your oncology and hematology care.   If you have a lab appointment with the Cancer Center, please go directly to the Cancer Center and check in at the registration area.   Wear comfortable clothing and clothing appropriate for easy access to any Portacath or PICC line.   We strive to give you quality time with your provider. You may need to reschedule your appointment if you arrive late (15 or more minutes).  Arriving late affects you and other patients whose appointments are after yours.  Also, if you miss three or more appointments without notifying the office, you may be dismissed from the clinic at the provider's discretion.      For prescription refill requests, have your pharmacy contact our office and allow 72 hours for refills to be completed.    Today you received the following chemotherapy and/or immunotherapy agents : Cisplatin    To help prevent nausea and vomiting after your treatment, we encourage you to take your nausea medication as directed.  BELOW ARE SYMPTOMS THAT SHOULD BE REPORTED IMMEDIATELY: *FEVER GREATER THAN 100.4 F (38 C) OR HIGHER *CHILLS OR SWEATING *NAUSEA AND VOMITING THAT IS NOT CONTROLLED WITH YOUR NAUSEA MEDICATION *UNUSUAL SHORTNESS OF BREATH *UNUSUAL BRUISING OR BLEEDING *URINARY PROBLEMS (pain or burning when urinating, or frequent urination) *BOWEL PROBLEMS (unusual diarrhea, constipation, pain near the anus) TENDERNESS IN MOUTH AND THROAT WITH OR WITHOUT PRESENCE OF ULCERS (sore throat, sores in mouth, or a toothache) UNUSUAL RASH, SWELLING OR PAIN  UNUSUAL VAGINAL DISCHARGE OR ITCHING   Items with * indicate a potential emergency and should be followed up as soon as possible or go to the Emergency Department if any problems should occur.  Please show the CHEMOTHERAPY ALERT CARD or IMMUNOTHERAPY ALERT CARD at check-in to  the Emergency Department and triage nurse.  Should you have questions after your visit or need to cancel or reschedule your appointment, please contact [facility]ICAL [facility]COL[facility]  Dept: [phone]  and follow the prompts.  Office hours are 8:00 a.m. to 4:30 p.m. Monday - Friday. Please note that voicemails left after 4:00 p.m. may not be returned until the following business day.  We are closed weekends and major holidays. You have access to a nurse at all times for urgent questions. Please call the main number to the clinic Dept: [phone] and follow the prompts.   For any non-urgent questions, you may also contact your provider using MyChart. We now offer e-Visits for anyone 18 and older to request care online for non-urgent symptoms. For details visit [email or web address].   Also download the MyChart app! Go to the app store, search "MyChart", open the app, select [place], and log in with your MyChart username and password.  Due to Covid, a mask is required upon entering the hospital/clinic. If you do not have a mask, one will be given to you upon arrival. For doctor visits, patients may have 1 support person aged 18 or older with them. For treatment visits, patients cannot have anyone with them due to current Covid guidelines and our immunocompromised population.   

## 2021-08-19 ENCOUNTER — Ambulatory Visit
Admission: RE | Admit: 2021-08-19 | Discharge: 2021-08-19 | Disposition: A | Discharge status: Home / Self Care | Payer: 59 | Source: Ambulatory Visit | Attending: Radiation Oncology | Admitting: Radiation Oncology

## 2021-08-19 ENCOUNTER — Other Ambulatory Visit: Payer: Self-pay

## 2021-08-19 DIAGNOSIS — Z5111 Encounter for antineoplastic chemotherapy: Secondary | ICD-10-CM | POA: Diagnosis not present

## 2021-08-20 ENCOUNTER — Ambulatory Visit
Admission: RE | Admit: 2021-08-20 | Discharge: 2021-08-20 | Disposition: A | Discharge status: Home / Self Care | Payer: 59 | Source: Ambulatory Visit | Attending: Radiation Oncology | Admitting: Radiation Oncology

## 2021-08-20 ENCOUNTER — Other Ambulatory Visit: Payer: Self-pay

## 2021-08-20 DIAGNOSIS — Z5111 Encounter for antineoplastic chemotherapy: Secondary | ICD-10-CM | POA: Diagnosis not present

## 2021-08-21 ENCOUNTER — Inpatient Hospital Stay: Payer: 59

## 2021-08-21 ENCOUNTER — Ambulatory Visit
Admission: RE | Admit: 2021-08-21 | Discharge: 2021-08-21 | Disposition: A | Discharge status: Home / Self Care | Payer: 59 | Source: Ambulatory Visit | Attending: Radiation Oncology | Admitting: Radiation Oncology

## 2021-08-21 DIAGNOSIS — C539 Malignant neoplasm of cervix uteri, unspecified: Secondary | ICD-10-CM

## 2021-08-21 DIAGNOSIS — Z5111 Encounter for antineoplastic chemotherapy: Secondary | ICD-10-CM | POA: Diagnosis not present

## 2021-08-21 LAB — BASIC METABOLIC PANEL - CANCER CENTER ONLY
Anion gap: 12 (ref 5–15)
BUN: 9 mg/dL (ref 6–20)
CO2: 23 mmol/L (ref 22–32)
Calcium: 9.7 mg/dL (ref 8.9–10.3)
Chloride: 104 mmol/L (ref 98–111)
Creatinine: 0.78 mg/dL (ref 0.44–1.00)
GFR, Estimated: >60 mL/min (ref >60)
Glucose, Bld: 111 mg/dL — ABNORMAL HIGH (ref 70–99)
Potassium: 4.1 mmol/L (ref 3.5–5.1)
Sodium: 139 mmol/L (ref 135–145)

## 2021-08-21 LAB — MAGNESIUM: Magnesium: 1.7 mg/dL (ref 1.7–2.4)

## 2021-08-21 LAB — CBC WITH DIFFERENTIAL (CANCER CENTER ONLY)
Abs Immature Granulocytes: 0.01 10*3/uL (ref 0.00–0.07)
Basophils Absolute: 0 10*3/uL (ref 0.0–0.1)
Basophils Relative: 0 %
Eosinophils Absolute: 0.3 10*3/uL (ref 0.0–0.5)
Eosinophils Relative: 9 %
HCT: 35.3 % — ABNORMAL LOW (ref 36.0–46.0)
Hemoglobin: 12.3 g/dL (ref 12.0–15.0)
Immature Granulocytes: 0 %
Lymphocytes Relative: 12 %
Lymphs Abs: 0.5 10*3/uL — ABNORMAL LOW (ref 0.7–4.0)
MCH: 33.2 pg (ref 26.0–34.0)
MCHC: 34.8 g/dL (ref 30.0–36.0)
MCV: 95.1 fL (ref 80.0–100.0)
Monocytes Absolute: 0.4 10*3/uL (ref 0.1–1.0)
Monocytes Relative: 9 %
Neutro Abs: 2.6 10*3/uL (ref 1.7–7.7)
Neutrophils Relative %: 70 %
Platelet Count: 151 10*3/uL (ref 150–400)
RBC: 3.71 MIL/uL — ABNORMAL LOW (ref 3.87–5.11)
RDW: 12 % (ref 11.5–15.5)
WBC Count: 3.8 10*3/uL — ABNORMAL LOW (ref 4.0–10.5)
nRBC: 0 % (ref 0.0–0.2)

## 2021-08-22 ENCOUNTER — Other Ambulatory Visit: Payer: Self-pay

## 2021-08-22 ENCOUNTER — Inpatient Hospital Stay (HOSPITAL_BASED_OUTPATIENT_CLINIC_OR_DEPARTMENT_OTHER): Payer: 59 | Admitting: Hematology and Oncology

## 2021-08-22 ENCOUNTER — Other Ambulatory Visit (HOSPITAL_COMMUNITY): Payer: Self-pay

## 2021-08-22 ENCOUNTER — Encounter: Payer: Self-pay | Admitting: Hematology and Oncology

## 2021-08-22 ENCOUNTER — Ambulatory Visit
Admission: RE | Admit: 2021-08-22 | Discharge: 2021-08-22 | Disposition: A | Discharge status: Home / Self Care | Payer: 59 | Source: Ambulatory Visit | Attending: Radiation Oncology | Admitting: Radiation Oncology

## 2021-08-22 DIAGNOSIS — R197 Diarrhea, unspecified: Secondary | ICD-10-CM

## 2021-08-22 DIAGNOSIS — C539 Malignant neoplasm of cervix uteri, unspecified: Secondary | ICD-10-CM

## 2021-08-22 DIAGNOSIS — Z5111 Encounter for antineoplastic chemotherapy: Secondary | ICD-10-CM | POA: Diagnosis not present

## 2021-08-22 DIAGNOSIS — D61818 Other pancytopenia: Secondary | ICD-10-CM

## 2021-08-22 MED ORDER — OXYCODONE HCL 5 MG PO TABS
5.0000 mg | ORAL_TABLET | ORAL | 0 refills | Status: DC | PRN
  Filled 2021-08-22: qty 30, 5d supply, fill #0

## 2021-08-22 MED FILL — Fosaprepitant Dimeglumine For IV Infusion 150 MG (Base Eq): INTRAVENOUS | Qty: 5 | Status: AC

## 2021-08-22 MED FILL — Dexamethasone Sodium Phosphate Inj 100 MG/10ML: INTRAMUSCULAR | Qty: 1 | Status: AC

## 2021-08-22 NOTE — Assessment & Plan Note (Signed)
Her mild pancytopenia is due to treatment She is not symptomatic We will observe closely

## 2021-08-22 NOTE — Assessment & Plan Note (Signed)
Overall, she tolerated chemotherapy very well except for diarrhea  her labs are stable She will proceed with chemotherapy as scheduled and I will see her on a weekly basis for further follow-up

## 2021-08-22 NOTE — Assessment & Plan Note (Addendum)
I recommend she take scheduled Imodium Due to rectal pain, I will also prescribe her oxycodone as needed

## 2021-08-22 NOTE — Progress Notes (Signed)
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   All questions were answered. The patient knows to call the clinic with any problems, questions or concerns. The total time spent in the appointment was 20 minutes encounter with patients including review of chart and various tests results, discussions about plan of care and coordination of care plan   Ashley Lark, MD 08/22/2021 3:20 PM  INTERVAL HISTORY: Please see below for problem oriented charting. she returns for treatment follow-up seen prior to week #4 of treatment with cisplatin for cervical cancer She developed severe diarrhea requiring Imodium.  She has also developed some rectal pain Denies recent vaginal bleeding No recent infection Denies nausea or peripheral neuropathy  REVIEW OF SYSTEMS:   Constitutional: Denies fevers, chills or abnormal weight loss Eyes: Denies blurriness of vision Ears, nose, mouth, throat, and face: Denies mucositis or sore throat Respiratory: Denies cough, dyspnea or wheezes Cardiovascular: Denies palpitation, chest discomfort or lower extremity swelling Skin: Denies abnormal skin rashes Lymphatics: Denies new lymphadenopathy or easy bruising Neurological:Denies numbness, tingling or new weaknesses Behavioral/Psych:  Mood is stable, no new changes  All other systems were reviewed with the patient and are negative.  I have reviewed the past medical history, past surgical history, social history and family history with the patient and they are unchanged from previous note.  ALLERGIES:  has No Known Allergies.  MEDICATIONS:  Current Outpatient Medications  Medication Sig Dispense Refill   oxyCODONE (OXY IR/ROXICODONE) 5 MG immediate release tablet Take 1 tablet (5 mg total) by mouth every 4 (four) hours as needed for severe pain. 30 tablet 0   ALPRAZolam (XANAX) 0.5 MG tablet Take 1 tablet (0.5 mg total) by mouth at bedtime as needed for anxiety. 90 tablet 1   Bioflavonoid Products (ESTER-C) 500-550 MG TABS Take by mouth.     Brimonidine Tartrate (LUMIFY) 0.025 % SOLN Place 1 drop into both eyes daily as needed (Red eye).     diphenoxylate-atropine (LOMOTIL) 2.5-0.025 MG tablet Take 1 tablet by mouth 4 (four) times daily as needed for diarrhea or loose stools. 30 tablet 0   FLUoxetine (PROZAC) 10 MG capsule TAKE ONE CAPSULE BY MOUTH DAILY 90 capsule 0   lamoTRIgine (LAMICTAL) 25 MG tablet Take 1 tablet (25 mg total) by mouth 2 (two) times daily. 180 tablet 3   levETIRAcetam (KEPPRA) 500 MG tablet Take 1 tablet (500 mg total) by mouth 2 (two) times daily. 180 tablet 3   levothyroxine (SYNTHROID) 50 MCG tablet TAKE ONE TABLET BY MOUTH DAILY ON AN EMPTY STOMACH 30 MINUTES BEFORE BREAKFAST 90 tablet 0   lidocaine-prilocaine (EMLA) cream Apply to affected area once 30 g 3   Magnesium 400 MG CAPS Take 400 mg by mouth daily.     olopatadine (PATANOL) 0.1 % ophthalmic solution Place 1 drop into both  eyes daily as needed for allergies.     ondansetron (ZOFRAN) 8 MG tablet Take 1 tablet (8 mg total) by mouth every 8 (eight) hours as needed. 30 tablet 1   prochlorperazine (COMPAZINE) 10 MG tablet Take 1 tablet (10 mg total) by mouth every 6 (six) hours as needed for nausea or vomiting. 30 tablet 1   rosuvastatin  (CRESTOR) 10 MG tablet Take 10 mg by mouth daily.     zolpidem (AMBIEN) 10 MG tablet TAKE ONE TABLET BY MOUTH EVERY NIGHT AT BEDTIME AS NEEDED 30 tablet 2   No current facility-administered medications for this visit.    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  Due to the abnormal bleeding that she was receiving she was then recommended to follow-up with her gynecologist, Dr. Talbert Nan, who first evaluated the endometrium with an endometrial biopsy performed on 05/20/2021 which revealed benign inactive atrophic endometrium negative for hyperplasia or malignancy.  The bleeding continued and the patient returned for a colposcopic evaluation of the cervix with biopsies (due to the presence of high risk HPV).  This took place on 06/17/2021.  At the time of that colposcopic evaluation there was some abnormal bleeding noted and friability to the cervix.  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   05/20/2021 Pathology Results   FINAL MICROSCOPIC DIAGNOSIS:   A. ENDOMETRIUM, BIOPSY:  - Benign inactive atrophic endometrium  - Negative for hyperplasia or malignancy   05/30/2021 Imaging   US pelvis Small anteverted uterus Thin, symmetrical endometrium Normal ovaries bilaterally No free fluid.   06/17/2021 Pathology Results   FINAL MICROSCOPIC DIAGNOSIS:   A. CERVIX, 6 O'CLOCK, BIOPSY:  -  Invasive squamous cell carcinoma  -  See comment   B. ENDOCERVIX, CURETTAGE:  -  Squamous cell carcinoma  -  See comment   COMMENT:    A.  The carcinoma is positive for p16 and p40 supporting the diagnosis of invasive squamous cell carcinoma.     07/11/2021 PET scan   Intense hypermetabolic activity associated with ill-defined nodular enlargement of the cervix/lower uterine segment, consistent with patient's biopsy proven cervical neoplasm.   No definite scintigraphic evidence of metastatic disease in the neck, chest, abdomen or pelvis.   Right middle lobe 4-5 mm perifissural pulmonary nodule unchanged in size from October 25, 2020 and below the resolution of PET-CT but favored to reflect a peri- fissural lymph node, attention on follow-up studies suggested   Nodular focus of hypermetabolic activity along the right anterior sublingual region, which is nonspecific correlation with direct visualization is suggested.   Hypermetabolic hyperplasia of the tonsils with a hypermetabolic 5 mm left level 1b cervical lymph node, favored reactive.   Hypermetabolic activity associated with callus formation in the right anterior sixth rib likely representing a healing nonpathologic rib fracture.   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 No acute intracranial abnormality. 2. Minimal multifocal hyperintense T2-weight signal within the white matter, nonspecific. This may be seen in the setting of chronic small vessel disease or migraine headaches.     07/25/2021 Imaging   CT head No acute intracranial abnormality.   07/30/2021 Procedure   Successful placement of a power injectable Port-A-Cath via the right internal jugular vein. The catheter is ready for immediate use.     08/02/2021 -  Chemotherapy   Patient is on Treatment Plan : Cervical Cisplatin q7d        PHYSICAL EXAMINATION: ECOG PERFORMANCE STATUS: 1 - Symptomatic but completely ambulatory  Vitals:   08/22/21 1454  BP: 115/72  Pulse: 87  Resp: 18  Temp: (!) 97.2 F (36.2 C)  SpO2: 100%   Filed Weights   08/22/21 1454  Weight: 163 lb 3.2 oz (74 kg)    GENERAL:alert, no distress and comfortable NEURO: alert & oriented x 3 with fluent speech, no focal motor/sensory deficits  LABORATORY DATA:  I have reviewed the data as listed    Component Value Date/Time   NA 139 08/21/2021 1443   K 4.1 08/21/2021 1443   CL 104 08/21/2021 1443   CO2 23 08/21/2021 1443   GLUCOSE 111 (H) 08/21/2021 1443   BUN 9 08/21/2021 1443   CREATININE 0.78 08/21/2021 1443   CREATININE 0.83 05/07/2021 1216   CALCIUM 9.7 08/21/2021 1443   PROT 7.4 07/25/2021 1725   ALBUMIN 4.5 07/25/2021 1725   AST 31 07/25/2021 1725   ALT 19 07/25/2021 1725   ALKPHOS 70 07/25/2021 1725   BILITOT 0.4 07/25/2021 1725   GFRNONAA >60 08/21/2021 1443   GFRNONAA 80 09/12/2020 1200   GFRAA 93 09/12/2020 1200    No results found for: SPEP, UPEP  Lab Results  Component Value Date   WBC 3.8 (L) 08/21/2021   NEUTROABS 2.6 08/21/2021   HGB 12.3 08/21/2021   HCT 35.3 (L) 08/21/2021   MCV 95.1 08/21/2021   PLT 151 08/21/2021      Chemistry      Component Value Date/Time   NA 139 08/21/2021 1443   K 4.1 08/21/2021 1443   CL 104 08/21/2021 1443   CO2 23 08/21/2021 1443   BUN 9 08/21/2021 1443   CREATININE 0.78 08/21/2021 1443   CREATININE 0.83 05/07/2021 1216      Component Value Date/Time   CALCIUM 9.7 08/21/2021 1443   ALKPHOS 70 07/25/2021 1725   AST 31 07/25/2021 1725   ALT 19 07/25/2021 1725   BILITOT 0.4 07/25/2021 1725

## 2021-08-23 ENCOUNTER — Encounter: Payer: Self-pay | Admitting: *Deleted

## 2021-08-23 ENCOUNTER — Other Ambulatory Visit: Payer: Self-pay | Admitting: Hematology and Oncology

## 2021-08-23 ENCOUNTER — Inpatient Hospital Stay: Payer: 59

## 2021-08-23 ENCOUNTER — Encounter: Payer: Self-pay | Admitting: Oncology

## 2021-08-23 ENCOUNTER — Ambulatory Visit
Admission: RE | Admit: 2021-08-23 | Discharge: 2021-08-23 | Disposition: A | Discharge status: Home / Self Care | Payer: 59 | Source: Ambulatory Visit | Attending: Radiation Oncology | Admitting: Radiation Oncology

## 2021-08-23 VITALS — BP 100/76 | HR 88 | Temp 98.4°F | Resp 16 | Ht 69.0 in | Wt 163.4 lb

## 2021-08-23 DIAGNOSIS — C539 Malignant neoplasm of cervix uteri, unspecified: Secondary | ICD-10-CM

## 2021-08-23 DIAGNOSIS — Z5111 Encounter for antineoplastic chemotherapy: Secondary | ICD-10-CM | POA: Diagnosis not present

## 2021-08-23 MED ORDER — SODIUM CHLORIDE 0.9% FLUSH
10.0000 mL | INTRAVENOUS | Status: DC | PRN
  Administered 2021-08-23: 10 mL

## 2021-08-23 MED ORDER — SODIUM CHLORIDE 0.9 % IV SOLN
10.0000 mg | Freq: Once | INTRAVENOUS | Status: AC
  Administered 2021-08-23: 10 mg via INTRAVENOUS
  Filled 2021-08-23: qty 10

## 2021-08-23 MED ORDER — SODIUM CHLORIDE 0.9 % IV SOLN
40.0000 mg/m2 | Freq: Once | INTRAVENOUS | Status: AC
  Administered 2021-08-23: 78 mg via INTRAVENOUS
  Filled 2021-08-23: qty 78

## 2021-08-23 MED ORDER — POTASSIUM CHLORIDE IN NACL 20-0.9 MEQ/L-% IV SOLN
Freq: Once | INTRAVENOUS | Status: AC
  Filled 2021-08-23: qty 1000

## 2021-08-23 MED ORDER — MAGNESIUM SULFATE 2 GM/50ML IV SOLN
2.0000 g | Freq: Once | INTRAVENOUS | Status: AC
  Administered 2021-08-23: 2 g via INTRAVENOUS
  Filled 2021-08-23: qty 50

## 2021-08-23 MED ORDER — PALONOSETRON HCL INJECTION 0.25 MG/5ML
0.2500 mg | Freq: Once | INTRAVENOUS | Status: AC
  Administered 2021-08-23: 0.25 mg via INTRAVENOUS
  Filled 2021-08-23: qty 5

## 2021-08-23 MED ORDER — HEPARIN SOD (PORK) LOCK FLUSH 100 UNIT/ML IV SOLN
500.0000 [IU] | Freq: Once | INTRAVENOUS | Status: AC | PRN
  Administered 2021-08-23: 500 [IU]

## 2021-08-23 MED ORDER — SODIUM CHLORIDE 0.9 % IV SOLN
150.0000 mg | Freq: Once | INTRAVENOUS | Status: AC
  Administered 2021-08-23: 150 mg via INTRAVENOUS
  Filled 2021-08-23: qty 150

## 2021-08-23 MED ORDER — SODIUM CHLORIDE 0.9 % IV SOLN: Freq: Once | INTRAVENOUS | Status: AC

## 2021-08-23 NOTE — Patient Instructions (Signed)
[facility] MEDICAL ONCOLOGY  Discharge Instructions: Thank you for choosing [facility] to provide your oncology and hematology care.   If you have a lab appointment with the Cancer Center, please go directly to the Cancer Center and check in at the registration area.   Wear comfortable clothing and clothing appropriate for easy access to any Portacath or PICC line.   We strive to give you quality time with your provider. You may need to reschedule your appointment if you arrive late (15 or more minutes).  Arriving late affects you and other patients whose appointments are after yours.  Also, if you miss three or more appointments without notifying the office, you may be dismissed from the clinic at the provider's discretion.      For prescription refill requests, have your pharmacy contact our office and allow 72 hours for refills to be completed.    Today you received the following chemotherapy and/or immunotherapy agents : Cisplatin    To help prevent nausea and vomiting after your treatment, we encourage you to take your nausea medication as directed.  BELOW ARE SYMPTOMS THAT SHOULD BE REPORTED IMMEDIATELY: *FEVER GREATER THAN 100.4 F (38 C) OR HIGHER *CHILLS OR SWEATING *NAUSEA AND VOMITING THAT IS NOT CONTROLLED WITH YOUR NAUSEA MEDICATION *UNUSUAL SHORTNESS OF BREATH *UNUSUAL BRUISING OR BLEEDING *URINARY PROBLEMS (pain or burning when urinating, or frequent urination) *BOWEL PROBLEMS (unusual diarrhea, constipation, pain near the anus) TENDERNESS IN MOUTH AND THROAT WITH OR WITHOUT PRESENCE OF ULCERS (sore throat, sores in mouth, or a toothache) UNUSUAL RASH, SWELLING OR PAIN  UNUSUAL VAGINAL DISCHARGE OR ITCHING   Items with * indicate a potential emergency and should be followed up as soon as possible or go to the Emergency Department if any problems should occur.  Please show the CHEMOTHERAPY ALERT CARD or IMMUNOTHERAPY ALERT CARD at check-in to  the Emergency Department and triage nurse.  Should you have questions after your visit or need to cancel or reschedule your appointment, please contact [facility]  Dept: [phone]  and follow the prompts.  Office hours are 8:00 a.m. to 4:30 p.m. Monday - Friday. Please note that voicemails left after 4:00 p.m. may not be returned until the following business day.  We are closed weekends and major holidays. You have access to a nurse at all times for urgent questions. Please call the main number to the clinic Dept: [phone] and follow the prompts.   For any non-urgent questions, you may also contact your provider using MyChart. We now offer e-Visits for anyone 18 and older to request care online for non-urgent symptoms. For details visit [email or web address].   Also download the MyChart app! Go to the app store, search "MyChart", open the app, select [facility] [place], and log in with your MyChart username and password.  Due to Covid, a mask is required upon entering the hospital/clinic. If you do not have a mask, one will be given to you upon arrival. For doctor visits, patients may have 1 support person aged 18 or older with them. For treatment visits, patients cannot have anyone with them due to current Covid guidelines and our immunocompromised population.   

## 2021-08-23 NOTE — Progress Notes (Signed)
[place] Work  Clinical Social Work was referred by Scientist, research (medical) for assessment of psychosocial needs; Magazine features editor.  Clinical Social Worker met with patient in infusion room to offer support and assess for needs.    Mrs. Hession explained current situation with lack of comprehensive insurance medical coverage and many needs.  The patient has spent considerable time navigating the financial billing system with [facility] and private practices.  She is unsure how to proceed and reported she needs to make decisions to receive additional treatment.    CSW provided information on Triage Cancer organization and [facility].  CSW notified Ulice Dash, Programmer, systems, to determine other options on how patient can navigate complex medical financial needs.    Gwinda Maine, LCSW  Clinical Social Worker [facility]

## 2021-08-24 DIAGNOSIS — Z5111 Encounter for antineoplastic chemotherapy: Secondary | ICD-10-CM | POA: Diagnosis not present

## 2021-08-24 DIAGNOSIS — Z510 Encounter for antineoplastic radiation therapy: Secondary | ICD-10-CM | POA: Diagnosis not present

## 2021-08-24 DIAGNOSIS — C539 Malignant neoplasm of cervix uteri, unspecified: Secondary | ICD-10-CM | POA: Diagnosis present

## 2021-08-26 ENCOUNTER — Ambulatory Visit
Admission: RE | Admit: 2021-08-26 | Discharge: 2021-08-26 | Disposition: A | Discharge status: Home / Self Care | Payer: 59 | Source: Ambulatory Visit | Attending: Radiation Oncology | Admitting: Radiation Oncology

## 2021-08-26 ENCOUNTER — Other Ambulatory Visit: Payer: Self-pay

## 2021-08-26 DIAGNOSIS — Z5111 Encounter for antineoplastic chemotherapy: Secondary | ICD-10-CM | POA: Diagnosis not present

## 2021-08-27 ENCOUNTER — Ambulatory Visit
Admission: RE | Admit: 2021-08-27 | Discharge: 2021-08-27 | Disposition: A | Discharge status: Home / Self Care | Payer: 59 | Source: Ambulatory Visit | Attending: Radiation Oncology | Admitting: Radiation Oncology

## 2021-08-27 DIAGNOSIS — Z5111 Encounter for antineoplastic chemotherapy: Secondary | ICD-10-CM | POA: Insufficient documentation

## 2021-08-27 DIAGNOSIS — C539 Malignant neoplasm of cervix uteri, unspecified: Secondary | ICD-10-CM | POA: Insufficient documentation

## 2021-08-27 DIAGNOSIS — Z510 Encounter for antineoplastic radiation therapy: Secondary | ICD-10-CM | POA: Insufficient documentation

## 2021-08-28 ENCOUNTER — Other Ambulatory Visit (HOSPITAL_COMMUNITY): Payer: Self-pay | Admitting: Radiation Oncology

## 2021-08-28 ENCOUNTER — Encounter: Payer: Self-pay | Admitting: Hematology and Oncology

## 2021-08-28 ENCOUNTER — Ambulatory Visit
Admission: RE | Admit: 2021-08-28 | Discharge: 2021-08-28 | Disposition: A | Discharge status: Home / Self Care | Payer: Self-pay | Source: Ambulatory Visit | Attending: Radiation Oncology | Admitting: Radiation Oncology

## 2021-08-28 ENCOUNTER — Other Ambulatory Visit: Payer: Self-pay

## 2021-08-28 ENCOUNTER — Inpatient Hospital Stay: Payer: Self-pay | Attending: Gynecologic Oncology

## 2021-08-28 DIAGNOSIS — F419 Anxiety disorder, unspecified: Secondary | ICD-10-CM | POA: Insufficient documentation

## 2021-08-28 DIAGNOSIS — R197 Diarrhea, unspecified: Secondary | ICD-10-CM | POA: Insufficient documentation

## 2021-08-28 DIAGNOSIS — C539 Malignant neoplasm of cervix uteri, unspecified: Secondary | ICD-10-CM

## 2021-08-28 DIAGNOSIS — E7800 Pure hypercholesterolemia, unspecified: Secondary | ICD-10-CM | POA: Insufficient documentation

## 2021-08-28 DIAGNOSIS — E039 Hypothyroidism, unspecified: Secondary | ICD-10-CM | POA: Insufficient documentation

## 2021-08-28 DIAGNOSIS — Z923 Personal history of irradiation: Secondary | ICD-10-CM | POA: Insufficient documentation

## 2021-08-28 DIAGNOSIS — Z5111 Encounter for antineoplastic chemotherapy: Secondary | ICD-10-CM | POA: Insufficient documentation

## 2021-08-28 DIAGNOSIS — Z87891 Personal history of nicotine dependence: Secondary | ICD-10-CM | POA: Insufficient documentation

## 2021-08-28 DIAGNOSIS — F32A Depression, unspecified: Secondary | ICD-10-CM | POA: Insufficient documentation

## 2021-08-28 DIAGNOSIS — Z79899 Other long term (current) drug therapy: Secondary | ICD-10-CM | POA: Insufficient documentation

## 2021-08-28 LAB — BASIC METABOLIC PANEL - CANCER CENTER ONLY
Anion gap: 7 (ref 5–15)
BUN: 9 mg/dL (ref 6–20)
CO2: 28 mmol/L (ref 22–32)
Calcium: 9.4 mg/dL (ref 8.9–10.3)
Chloride: 104 mmol/L (ref 98–111)
Creatinine: 0.79 mg/dL (ref 0.44–1.00)
GFR, Estimated: >60 mL/min (ref >60)
Glucose, Bld: 112 mg/dL — ABNORMAL HIGH (ref 70–99)
Potassium: 4.1 mmol/L (ref 3.5–5.1)
Sodium: 139 mmol/L (ref 135–145)

## 2021-08-28 LAB — CBC WITH DIFFERENTIAL (CANCER CENTER ONLY)
Abs Immature Granulocytes: 0 10*3/uL (ref 0.00–0.07)
Basophils Absolute: 0 10*3/uL (ref 0.0–0.1)
Basophils Relative: 0 %
Eosinophils Absolute: 0.2 10*3/uL (ref 0.0–0.5)
Eosinophils Relative: 8 %
HCT: 33.2 % — ABNORMAL LOW (ref 36.0–46.0)
Hemoglobin: 11.8 g/dL — ABNORMAL LOW (ref 12.0–15.0)
Immature Granulocytes: 0 %
Lymphocytes Relative: 12 %
Lymphs Abs: 0.4 10*3/uL — ABNORMAL LOW (ref 0.7–4.0)
MCH: 33.4 pg (ref 26.0–34.0)
MCHC: 35.5 g/dL (ref 30.0–36.0)
MCV: 94.1 fL (ref 80.0–100.0)
Monocytes Absolute: 0.3 10*3/uL (ref 0.1–1.0)
Monocytes Relative: 11 %
Neutro Abs: 2.1 10*3/uL (ref 1.7–7.7)
Neutrophils Relative %: 69 %
Platelet Count: 107 10*3/uL — ABNORMAL LOW (ref 150–400)
RBC: 3.53 MIL/uL — ABNORMAL LOW (ref 3.87–5.11)
RDW: 12.3 % (ref 11.5–15.5)
WBC Count: 3 10*3/uL — ABNORMAL LOW (ref 4.0–10.5)
nRBC: 0 % (ref 0.0–0.2)

## 2021-08-28 LAB — MAGNESIUM: Magnesium: 1.7 mg/dL (ref 1.7–2.4)

## 2021-08-29 ENCOUNTER — Encounter: Payer: Self-pay | Admitting: Hematology and Oncology

## 2021-08-29 ENCOUNTER — Other Ambulatory Visit: Payer: Self-pay

## 2021-08-29 ENCOUNTER — Ambulatory Visit
Admission: RE | Admit: 2021-08-29 | Discharge: 2021-08-29 | Disposition: A | Discharge status: Home / Self Care | Payer: Self-pay | Source: Ambulatory Visit | Attending: Radiation Oncology | Admitting: Radiation Oncology

## 2021-08-29 ENCOUNTER — Inpatient Hospital Stay (HOSPITAL_BASED_OUTPATIENT_CLINIC_OR_DEPARTMENT_OTHER): Payer: Self-pay | Admitting: Hematology and Oncology

## 2021-08-29 DIAGNOSIS — C539 Malignant neoplasm of cervix uteri, unspecified: Secondary | ICD-10-CM

## 2021-08-29 DIAGNOSIS — R197 Diarrhea, unspecified: Secondary | ICD-10-CM

## 2021-08-29 DIAGNOSIS — D61818 Other pancytopenia: Secondary | ICD-10-CM

## 2021-08-29 MED FILL — Dexamethasone Sodium Phosphate Inj 100 MG/10ML: INTRAMUSCULAR | Qty: 1 | Status: AC

## 2021-08-29 MED FILL — Fosaprepitant Dimeglumine For IV Infusion 150 MG (Base Eq): INTRAVENOUS | Qty: 5 | Status: AC

## 2021-08-29 NOTE — Assessment & Plan Note (Signed)
Overall, she tolerated chemotherapy very well except for diarrhea  her labs are stable She will proceed with chemotherapy as scheduled After completion of treatment, she will have further evaluation by GYN surgeon and radiation oncologist Will defer to them for further management and surveillance imaging

## 2021-08-29 NOTE — Assessment & Plan Note (Signed)
I recommend she take scheduled Imodium

## 2021-08-29 NOTE — Progress Notes (Signed)
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   All questions were answered. The patient knows to call the clinic with any problems, questions or concerns. The total time spent in the appointment was 20 minutes encounter with patients including review of chart and various tests results, discussions about plan of care and coordination of care plan   [name], MD 08/29/2021 4:18 PM  INTERVAL HISTORY: Please see below for problem oriented charting. she returns for treatment follow-up seen prior to cycle 5 of cisplatin for concurrent chemoradiation therapy for cervical cancer The patient had issue with financial situation recently and is still dealing with Her diarrhea is stable with Imodium Her appetite is fair She denies recent bleeding No peripheral neuropathy from therapy  REVIEW OF SYSTEMS:   Constitutional: Denies fevers, chills or abnormal weight loss Eyes: Denies blurriness of vision Ears, nose, mouth, throat, and face: Denies mucositis or sore throat Respiratory: Denies cough, dyspnea or wheezes Cardiovascular: Denies palpitation, chest discomfort or lower extremity swelling Skin: Denies abnormal skin rashes Lymphatics: Denies new lymphadenopathy  or easy bruising Neurological:Denies numbness, tingling or new weaknesses Behavioral/Psych: Mood is stable, no new changes  All other systems were reviewed with the patient and are negative.  I have reviewed the past medical history, past surgical history, social history and family history with the patient and they are unchanged from previous note.  ALLERGIES:  has No Known Allergies.  MEDICATIONS:  Current Outpatient Medications  Medication Sig Dispense Refill   ALPRAZolam (XANAX) 0.5 MG tablet Take 1 tablet (0.5 mg total) by mouth at bedtime as needed for anxiety. 90 tablet 1   Bioflavonoid Products (ESTER-C) 500-550 MG TABS Take by mouth.     Brimonidine Tartrate (LUMIFY) 0.025 % SOLN Place 1 drop into both eyes daily as needed (Red eye).     diphenoxylate-atropine (LOMOTIL) 2.5-0.025 MG tablet Take 1 tablet by mouth 4 (four) times daily as needed for diarrhea or loose stools. 30 tablet 0   FLUoxetine (PROZAC) 10 MG capsule TAKE ONE CAPSULE BY MOUTH DAILY 90 capsule 0   lamoTRIgine (LAMICTAL) 25 MG tablet Take 1 tablet (25 mg total) by mouth 2 (two) times daily. 180 tablet 3   levETIRAcetam (KEPPRA) 500 MG tablet Take 1 tablet (500 mg total) by mouth 2 (two) times daily. 180 tablet 3   levothyroxine (SYNTHROID) 50 MCG tablet TAKE ONE TABLET BY MOUTH DAILY ON AN EMPTY STOMACH 30 MINUTES BEFORE BREAKFAST 90 tablet 0   lidocaine-prilocaine (EMLA) cream Apply to affected area once 30 g 3   Magnesium 400 MG CAPS Take 400 mg by mouth daily.     olopatadine (PATANOL) 0.1 % ophthalmic solution Place 1 drop into both eyes daily as needed for allergies.     ondansetron (ZOFRAN) 8 MG tablet Take 1 tablet (8 mg  total) by mouth every 8 (eight) hours as needed. 30 tablet 1   oxyCODONE (OXY IR/ROXICODONE) 5 MG immediate release tablet Take 1 tablet (5 mg total) by mouth every 4 (four) hours as needed for severe pain. 30 tablet 0   prochlorperazine (COMPAZINE) 10 MG tablet Take 1 tablet (10 mg total) by  mouth every 6 (six) hours as needed for nausea or vomiting. 30 tablet 1   rosuvastatin (CRESTOR) 10 MG tablet Take 10 mg by mouth daily.     zolpidem (AMBIEN) 10 MG tablet TAKE ONE TABLET BY MOUTH EVERY NIGHT AT BEDTIME AS NEEDED 30 tablet 2   No current facility-administered medications for this visit.    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  Due to the abnormal bleeding that she was receiving she was then recommended to follow-up with her gynecologist, Dr. Talbert Nan, who first evaluated the endometrium with an endometrial biopsy performed on 05/20/2021 which revealed benign inactive atrophic endometrium negative for hyperplasia or malignancy.  The bleeding continued and the patient returned for a colposcopic evaluation of the cervix with biopsies (due to the presence of high risk HPV).  This took place on 06/17/2021.  At the time of that colposcopic evaluation there was some abnormal bleeding noted and friability to the cervix.  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   05/20/2021 Pathology Results   FINAL MICROSCOPIC DIAGNOSIS:   A. ENDOMETRIUM, BIOPSY:  - Benign inactive atrophic endometrium  - Negative for hyperplasia or malignancy   05/30/2021 Imaging   US pelvis Small anteverted uterus Thin, symmetrical endometrium Normal ovaries bilaterally No free fluid.   06/17/2021 Pathology Results   FINAL MICROSCOPIC DIAGNOSIS:   A. CERVIX, 6 O'CLOCK, BIOPSY:  -  Invasive squamous cell carcinoma  -  See comment    B. ENDOCERVIX, CURETTAGE:  -  Squamous cell carcinoma  -  See comment   COMMENT:   A.  The carcinoma is positive for p16 and p40 supporting the diagnosis of invasive squamous cell carcinoma.     07/11/2021 PET scan   Intense hypermetabolic activity associated with ill-defined nodular enlargement of the cervix/lower uterine segment, consistent with patient's biopsy proven cervical neoplasm.   No definite scintigraphic evidence of metastatic disease in the neck, chest, abdomen or pelvis.   Right middle lobe 4-5 mm perifissural pulmonary nodule unchanged in size from October 25, 2020 and below the resolution of PET-CT but favored to reflect a peri- fissural lymph node, attention on follow-up studies suggested   Nodular focus of hypermetabolic activity along the right anterior sublingual region, which is nonspecific correlation with direct visualization is suggested.   Hypermetabolic hyperplasia of the tonsils with a hypermetabolic 5 mm left level 1b cervical lymph node, favored reactive.   Hypermetabolic activity associated with callus formation in the right anterior sixth rib likely representing a healing nonpathologic rib fracture.   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 No acute intracranial abnormality. 2. Minimal multifocal hyperintense T2-weight signal within the white matter, nonspecific. This may be seen in the setting of chronic small vessel disease or migraine headaches.     07/25/2021 Imaging   CT head No acute intracranial abnormality.   07/30/2021 Procedure   Successful placement of a power injectable Port-A-Cath via the right internal jugular vein. The catheter is ready for immediate use.     08/02/2021  -  Chemotherapy   Patient is on Treatment Plan : Cervical Cisplatin q7d       PHYSICAL EXAMINATION: ECOG PERFORMANCE STATUS: 1 - Symptomatic but completely ambulatory  Vitals:   08/29/21 1443  BP: (!) 99/58  Pulse: 100  Resp: 18  Temp: 99.1 F (37.3 C)  SpO2: 100%   Filed Weights   08/29/21 1443  Weight: 162 lb 3.2 oz (73.6 kg)    GENERAL:alert, no distress and comfortable NEURO: alert & oriented x 3 with fluent speech, no focal motor/sensory deficits  LABORATORY DATA:  I have reviewed the data as listed    Component Value Date/Time   NA 139 08/28/2021 1441   K 4.1 08/28/2021 1441   CL 104 08/28/2021 1441   CO2 28 08/28/2021 1441   GLUCOSE 112 (H) 08/28/2021 1441   BUN 9 08/28/2021 1441   CREATININE 0.79 08/28/2021 1441   CREATININE 0.83 05/07/2021 1216   CALCIUM 9.4 08/28/2021 1441   PROT 7.4 07/25/2021 1725   ALBUMIN 4.5 07/25/2021 1725   AST 31 07/25/2021 1725   ALT 19 07/25/2021 1725   ALKPHOS 70 07/25/2021 1725   BILITOT 0.4 07/25/2021 1725   GFRNONAA >60 08/28/2021 1441   GFRNONAA 80 09/12/2020 1200   GFRAA 93 09/12/2020 1200    No results found for: SPEP, UPEP  Lab Results  Component Value Date   WBC 3.0 (L) 08/28/2021   NEUTROABS 2.1 08/28/2021   HGB 11.8 (L) 08/28/2021   HCT 33.2 (L) 08/28/2021   MCV 94.1 08/28/2021   PLT 107 (L) 08/28/2021      Chemistry      Component Value Date/Time   NA 139 08/28/2021 1441   K 4.1 08/28/2021 1441   CL 104 08/28/2021 1441   CO2 28 08/28/2021 1441   BUN 9 08/28/2021 1441   CREATININE 0.79 08/28/2021 1441   CREATININE 0.83 05/07/2021 1216      Component Value Date/Time   CALCIUM 9.4 08/28/2021 1441   ALKPHOS 70 07/25/2021 1725   AST 31 07/25/2021 1725   ALT 19 07/25/2021 1725   BILITOT 0.4 07/25/2021 1725

## 2021-08-29 NOTE — Assessment & Plan Note (Signed)
Her mild pancytopenia is due to treatment She is not symptomatic We will observe closely

## 2021-08-30 ENCOUNTER — Other Ambulatory Visit: Payer: Self-pay | Admitting: Hematology and Oncology

## 2021-08-30 ENCOUNTER — Ambulatory Visit
Admission: RE | Admit: 2021-08-30 | Discharge: 2021-08-30 | Disposition: A | Discharge status: Home / Self Care | Payer: Self-pay | Source: Ambulatory Visit | Attending: Radiation Oncology | Admitting: Radiation Oncology

## 2021-08-30 ENCOUNTER — Encounter: Payer: Self-pay | Admitting: Hematology and Oncology

## 2021-08-30 ENCOUNTER — Inpatient Hospital Stay: Payer: Self-pay

## 2021-08-30 VITALS — BP 108/61 | HR 87 | Temp 98.9°F | Resp 18

## 2021-08-30 DIAGNOSIS — C539 Malignant neoplasm of cervix uteri, unspecified: Secondary | ICD-10-CM

## 2021-08-30 MED ORDER — POTASSIUM CHLORIDE IN NACL 20-0.9 MEQ/L-% IV SOLN
Freq: Once | INTRAVENOUS | Status: AC
  Filled 2021-08-30: qty 1000

## 2021-08-30 MED ORDER — SODIUM CHLORIDE 0.9 % IV SOLN: INTRAVENOUS | Status: AC

## 2021-08-30 MED ORDER — MAGNESIUM SULFATE 2 GM/50ML IV SOLN
2.0000 g | Freq: Once | INTRAVENOUS | Status: AC
  Administered 2021-08-30: 2 g via INTRAVENOUS
  Filled 2021-08-30: qty 50

## 2021-08-30 MED ORDER — PALONOSETRON HCL INJECTION 0.25 MG/5ML
0.2500 mg | Freq: Once | INTRAVENOUS | Status: AC
  Administered 2021-08-30: 0.25 mg via INTRAVENOUS
  Filled 2021-08-30: qty 5

## 2021-08-30 MED ORDER — HEPARIN SOD (PORK) LOCK FLUSH 100 UNIT/ML IV SOLN
500.0000 [IU] | Freq: Once | INTRAVENOUS | Status: AC | PRN
  Administered 2021-08-30: 500 [IU]

## 2021-08-30 MED ORDER — SODIUM CHLORIDE 0.9 % IV SOLN
40.0000 mg/m2 | Freq: Once | INTRAVENOUS | Status: AC
  Administered 2021-08-30: 78 mg via INTRAVENOUS
  Filled 2021-08-30: qty 78

## 2021-08-30 MED ORDER — SODIUM CHLORIDE 0.9 % IV SOLN
150.0000 mg | Freq: Once | INTRAVENOUS | Status: AC
  Administered 2021-08-30: 150 mg via INTRAVENOUS
  Filled 2021-08-30: qty 150

## 2021-08-30 MED ORDER — SODIUM CHLORIDE 0.9 % IV SOLN: Freq: Once | INTRAVENOUS | Status: AC

## 2021-08-30 MED ORDER — SODIUM CHLORIDE 0.9% FLUSH
10.0000 mL | INTRAVENOUS | Status: DC | PRN
  Administered 2021-08-30: 10 mL

## 2021-08-30 MED ORDER — SODIUM CHLORIDE 0.9 % IV SOLN
10.0000 mg | Freq: Once | INTRAVENOUS | Status: AC
  Administered 2021-08-30: 10 mg via INTRAVENOUS
  Filled 2021-08-30: qty 10

## 2021-08-30 NOTE — Patient Instructions (Signed)
[facility] MEDICAL [facility]COLOG[facility]  Discharge Instructions: Thank you for choosing [facility] to provide your oncology and hematology care.   If you have a lab appointment with the Cancer Center, please go directly to the Cancer Center and check in at the registration area.   Wear comfortable clothing and clothing appropriate for easy access to any Portacath or PICC line.   We strive to give you quality time with your provider. You may need to reschedule your appointment if you arrive late (15 or more minutes).  Arriving late affects you and other patients whose appointments are after yours.  Also, if you miss three or more appointments without notifying the office, you may be dismissed from the clinic at the provider's discretion.      For prescription refill requests, have your pharmacy contact our office and allow 72 hours for refills to be completed.    Today you received the following chemotherapy and/or immunotherapy agents cisplatin   To help prevent nausea and vomiting after your treatment, we encourage you to take your nausea medication as directed.  BELOW ARE SYMPTOMS THAT SHOULD BE REPORTED IMMEDIATELY: *FEVER GREATER THAN 100.4 F (38 C) OR HIGHER *CHILLS OR SWEATING *NAUSEA AND VOMITING THAT IS NOT CONTROLLED WITH YOUR NAUSEA MEDICATION *UNUSUAL SHORTNESS OF BREATH *UNUSUAL BRUISING OR BLEEDING *URINARY PROBLEMS (pain or burning when urinating, or frequent urination) *BOWEL PROBLEMS (unusual diarrhea, constipation, pain near the anus) TENDERNESS IN MOUTH AND THROAT WITH OR WITHOUT PRESENCE OF ULCERS (sore throat, sores in mouth, or a toothache) UNUSUAL RASH, SWELLING OR PAIN  UNUSUAL VAGINAL DISCHARGE OR ITCHING   Items with * indicate a potential emergency and should be followed up as soon as possible or go to the Emergency Department if any problems should occur.  Please show the CHEMOTHERAPY ALERT CARD or IMMUNOTHERAPY ALERT CARD at check-in to the  Emergency Department and triage nurse.  Should you have questions after your visit or need to cancel or reschedule your appointment, please contact [facility]  Dept: [phone]  and follow the prompts.  Office hours are 8:00 a.m. to 4:30 p.m. Monday - Friday. Please note that voicemails left after 4:00 p.m. may not be returned until the following business day.  We are closed weekends and major holidays. You have access to a nurse at all times for urgent questions. Please call the main number to the clinic Dept: [phone] and follow the prompts.   For any non-urgent questions, you may also contact your provider using MyChart. We now offer e-Visits for anyone 18 and older to request care online for non-urgent symptoms. For details visit [email or web address].   Also download the MyChart app! Go to the app store, search "MyChart", open the app, select , and log in with your MyChart username and password.  Due to Covid, a mask is required upon entering the hospital/clinic. If you do not have a mask, one will be given to you upon arrival. For doctor visits, patients may have 1 support person aged 18 or older with them. For treatment visits, patients cannot have anyone with them due to current Covid guidelines and our immunocompromised population.   

## 2021-09-02 ENCOUNTER — Ambulatory Visit
Admission: RE | Admit: 2021-09-02 | Discharge: 2021-09-02 | Disposition: A | Discharge status: Home / Self Care | Payer: Self-pay | Source: Ambulatory Visit | Attending: Radiation Oncology | Admitting: Radiation Oncology

## 2021-09-02 ENCOUNTER — Other Ambulatory Visit: Payer: Self-pay

## 2021-09-03 ENCOUNTER — Ambulatory Visit
Admission: RE | Admit: 2021-09-03 | Discharge: 2021-09-03 | Disposition: A | Discharge status: Home / Self Care | Payer: Self-pay | Source: Ambulatory Visit | Attending: Radiation Oncology | Admitting: Radiation Oncology

## 2021-09-04 ENCOUNTER — Ambulatory Visit: Payer: Self-pay | Admitting: Gynecologic Oncology

## 2021-09-09 ENCOUNTER — Other Ambulatory Visit: Payer: Self-pay

## 2021-09-09 ENCOUNTER — Inpatient Hospital Stay (HOSPITAL_BASED_OUTPATIENT_CLINIC_OR_DEPARTMENT_OTHER): Payer: Self-pay | Admitting: Gynecologic Oncology

## 2021-09-09 VITALS — BP 116/56 | HR 86 | Temp 97.9°F | Resp 16 | Ht 69.0 in | Wt 164.2 lb

## 2021-09-09 DIAGNOSIS — C539 Malignant neoplasm of cervix uteri, unspecified: Secondary | ICD-10-CM

## 2021-09-09 NOTE — Patient Instructions (Signed)
It was nice to meet you today.  I have spoken with Dr. Sondra Come about doing the internal high-dose radiation on December 1.  Santiago Glad, our nurse navigator, will work to help get this scheduled.  We will tentatively plan for surgery with me for a total hysterectomy and removal of your tubes and ovaries on December 29.  From an insurance standpoint, if we are able to move the surgery back a couple of weeks into mid January, this would be ideal.  Please let me know if things change from an insurance standpoint for you.  Once we have your surgery scheduled and we know when your preoperative appointment is, we will call you with a visit date and time to see Joylene John, our nurse practitioner, to go over further surgery details.

## 2021-09-09 NOTE — Progress Notes (Signed)
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  Due to the abnormal bleeding that she was receiving she was then recommended to follow-up with her gynecologist, Dr. Talbert Nan, who first evaluated the endometrium with an endometrial biopsy performed on 05/20/2021 which revealed benign inactive atrophic endometrium negative for hyperplasia or malignancy.  The bleeding continued and the patient returned for a colposcopic evaluation of the cervix with biopsies (due to the presence of high risk HPV).  This took place on 06/17/2021.  At the time of that colposcopic evaluation there was some abnormal bleeding noted and friability to the cervix.  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   05/20/2021 Pathology Results   FINAL MICROSCOPIC DIAGNOSIS:   A. ENDOMETRIUM, BIOPSY:  - Benign inactive atrophic endometrium  - Negative for hyperplasia or malignancy   05/30/2021 Imaging   US pelvis Small anteverted uterus Thin, symmetrical endometrium Normal ovaries bilaterally No free fluid.   06/17/2021 Pathology Results   FINAL MICROSCOPIC DIAGNOSIS:   A. CERVIX, 6 O'CLOCK, BIOPSY:  -  Invasive squamous cell carcinoma  -  See comment   B. ENDOCERVIX, CURETTAGE:  -  Squamous cell carcinoma  -  See comment   COMMENT:   A.  The carcinoma is positive for p16 and p40 supporting the diagnosis of invasive squamous cell  carcinoma.     07/11/2021 PET scan   Intense hypermetabolic activity associated with ill-defined nodular enlargement of the cervix/lower uterine segment, consistent with patient's biopsy proven cervical neoplasm.   No definite scintigraphic evidence of metastatic disease in the neck, chest, abdomen or pelvis.   Right middle lobe 4-5 mm perifissural pulmonary nodule unchanged in size from October 25, 2020 and below the resolution of PET-CT but favored to reflect a peri- fissural lymph node, attention on follow-up studies suggested   Nodular focus of hypermetabolic activity along the right anterior sublingual region, which is nonspecific correlation with direct visualization is suggested.   Hypermetabolic hyperplasia of the tonsils with a hypermetabolic 5 mm left level 1b cervical lymph node, favored reactive.   Hypermetabolic activity associated with callus formation in the right anterior sixth rib likely representing a healing nonpathologic rib fracture.   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 No acute intracranial abnormality. 2. Minimal multifocal hyperintense T2-weight signal within the white matter, nonspecific. This may be seen in the setting of chronic small vessel disease or migraine headaches.     07/25/2021 Imaging   CT head No acute intracranial abnormality.   07/30/2021 Procedure   Successful placement of a power injectable Port-A-Cath via the right internal jugular vein. The catheter is ready for immediate use.     08/02/2021 -  Chemotherapy   Patient is on Treatment Plan : Cervical Cisplatin q7d       Interval History: Patient presents today for follow-up and an exam after completing external beam therapy  recently.  She notes overall doing well although continues to have some diarrhea, but is resolving as she gets further out from radiation.  She denies any vaginal bleeding or discharge after the first couple of weeks of treatment.  She denies any abdominal or pelvic pain.  Past Medical/Surgical History: Past Medical History:  Diagnosis Date   Anxiety    Arthritis    Arthritis of ankle    Right   Cervical cancer ([place])    Depression    Episode of loss of consciousness    Hypothyroidism    Insomnia    Pure hypercholesterolemia    Seizure ([place])     Past Surgical History:  Procedure Laterality Date   ANKLE SURGERY Right    [facility]   IR IMAGING GUIDED PORT INSERTION  07/30/2021   WISDOM TOOTH EXTRACTION      Family History  Problem Relation Age of Onset   Heart attack Father    Diabetes Brother    Pancreatic cancer Maternal Grandmother    Ovarian cancer Other    Endometrial cancer Other    Colon cancer Other    Prostate cancer Other    Pancreatic cancer Other    Sudden death Neg Hx    Hyperlipidemia Neg Hx    Hypertension Neg Hx    Breast cancer Neg Hx     Social History   Socioeconomic History   Marital status: Married    Spouse name: Not on file   Number of children: 2   Years of education: Not on file   Highest education level: Not on file  Occupational History   Not on file  Tobacco Use   Smoking status: Former    Packs/day: 0.25    Years: 25.00    Pack years: 6.25    Types: Cigarettes    Quit date: 2016    Years since quitting: 6.8   Smokeless tobacco: Never   Tobacco comments:    20+ yrs  Vaping Use   Vaping Use: Never used  Substance and Sexual Activity   Alcohol use: Not Currently    Comment: 14 drinks a week of wine   Drug use: Yes    Types: Marijuana    Comment: Last use 07-10-21   Sexual activity: Not Currently    Birth control/protection: Post-menopausal  Other Topics Concern   Not on file  Social History Narrative   Not on file   Social  Determinants of Health   Financial Resource Strain: Not on file  Food Insecurity: Not on file  Transportation Needs: Not on file  Physical Activity: Not on file  Stress: Not on file  Social Connections: Not on file    Current Medications:  Current Outpatient Medications:    ALPRAZolam (XANAX) 0.5 MG tablet, Take 1 tablet (0.5 mg total) by mouth at bedtime as needed for anxiety., Disp: 90 tablet, Rfl: 1   Bioflavonoid Products (ESTER-C) 500-550 MG TABS, Take by mouth., Disp: , Rfl:    Brimonidine Tartrate (LUMIFY) 0.025 % SOLN, Place 1 drop into both eyes daily as needed (Red eye)., Disp: , Rfl:    diphenoxylate-atropine (LOMOTIL) 2.5-0.025 MG tablet, Take 1 tablet by mouth 4 (four) times daily as needed for diarrhea or loose stools., Disp: 30 tablet, Rfl: 0   FLUoxetine (PROZAC) 10  MG capsule, TAKE ONE CAPSULE BY MOUTH DAILY, Disp: 90 capsule, Rfl: 0   lamoTRIgine (LAMICTAL) 25 MG tablet, Take 1 tablet (25 mg total) by mouth 2 (two) times daily., Disp: 180 tablet, Rfl: 3   levETIRAcetam (KEPPRA) 500 MG tablet, Take 1 tablet (500 mg total) by mouth 2 (two) times daily., Disp: 180 tablet, Rfl: 3   levothyroxine (SYNTHROID) 50 MCG tablet, TAKE ONE TABLET BY MOUTH DAILY ON AN EMPTY STOMACH 30 MINUTES BEFORE BREAKFAST, Disp: 90 tablet, Rfl: 0   lidocaine-prilocaine (EMLA) cream, Apply to affected area once, Disp: 30 g, Rfl: 3   Magnesium 400 MG CAPS, Take 400 mg by mouth daily., Disp: , Rfl:    olopatadine (PATANOL) 0.1 % ophthalmic solution, Place 1 drop into both eyes daily as needed for allergies., Disp: , Rfl:    ondansetron (ZOFRAN) 8 MG tablet, Take 1 tablet (8 mg total) by mouth every 8 (eight) hours as needed., Disp: 30 tablet, Rfl: 1   oxyCODONE (OXY IR/ROXICODONE) 5 MG immediate release tablet, Take 1 tablet (5 mg total) by mouth every 4 (four) hours as needed for severe pain., Disp: 30 tablet, Rfl: 0   prochlorperazine (COMPAZINE) 10 MG tablet, Take 1 tablet (10 mg total) by mouth  every 6 (six) hours as needed for nausea or vomiting., Disp: 30 tablet, Rfl: 1   rosuvastatin (CRESTOR) 10 MG tablet, Take 10 mg by mouth daily., Disp: , Rfl:    zolpidem (AMBIEN) 10 MG tablet, TAKE ONE TABLET BY MOUTH EVERY NIGHT AT BEDTIME AS NEEDED, Disp: 30 tablet, Rfl: 2  Review of Systems: Pertinent positives include anxiety and depression. Denies appetite changes, fevers, chills, fatigue, unexplained weight changes. Denies hearing loss, neck lumps or masses, mouth sores, ringing in ears or voice changes. Denies cough or wheezing.  Denies shortness of breath. Denies chest pain or palpitations. Denies leg swelling. Denies abdominal distention, pain, blood in stools, constipation, diarrhea, nausea, vomiting, or early satiety. Denies pain with intercourse, dysuria, frequency, hematuria or incontinence. Denies hot flashes, pelvic pain, vaginal bleeding or vaginal discharge.   Denies joint pain, back pain or muscle pain/cramps. Denies itching, rash, or wounds. Denies dizziness, headaches, numbness or seizures. Denies swollen lymph nodes or glands, denies easy bruising or bleeding. Denies confusion, or decreased concentration.  Physical Exam: BP (!) 116/56 (BP Location: Left Arm, Patient Position: Sitting)   Pulse 86   Temp 97.9 F (36.6 C) (Tympanic)   Resp 16   Ht 5\' 9"  (1.753 m)   Wt 164 lb 3.2 oz (74.5 kg)   SpO2 100%   BMI 24.25 kg/m  General: Alert, oriented, no acute distress. HEENT: Normocephalic, atraumatic, sclera anicteric. Chest: Clear to auscultation bilaterally.  No wheezes or rhonchi. Cardiovascular: Regular rate and rhythm, no murmurs. Abdomen: soft, nontender.  Normoactive bowel sounds.  No masses or hepatosplenomegaly appreciated.   Extremities: Grossly normal range of motion.  Warm, well perfused.  No edema bilaterally. Skin: No rashes or lesions noted. Lymphatics: No cervical, supraclavicular, or inguinal adenopathy. GU: Normal appearing external genitalia  without erythema, excoriation, or lesions.  Speculum exam reveals mildly atrophic vaginal mucosa.  Cervix is normal in appearance externally although although there is a 1-2 cm polypoid appearing lesion at the cervical os with atypical vascularity concerning for persistent disease.  Bimanual exam reveals cervix is mildly firm and still barrel-shaped, no obvious parametrial involvement.  Rectovaginal exam confirms these findings.  Laboratory & Radiologic Studies: None new  Assessment & Plan: BARRIE SIGMUND is a  55 y.o. woman with Stage IB3 SCC of the cervix who presents for treatment discussion.  Overall, the patient has had a nice response to therapy but appears to have persistent disease. She had previously discussed post treatment hysterectomy with Dr. Denman George. I think that she is still a good candidate for this. We reviewed doing this 6 weeks after finishing EBRT versus doing one intracavitary treatment followed by surgery. I think the patient would benefit from HDR.  Given insurance, the patient will not have coverage until December. I spoke with Dr. Sondra Come today about delaying HDR treatment scheduled for later this week until the 1st or 2nd of December. Ideally we would wait 6 weeks after HDR, but I think its reasonable to plan for 4 weeks at the end of December. She is going to find out more information about her new insurance starting in January. If possible to delay surgery until mid-January without significant financial burden on the patient, then we will.   Tentative date for surgery for extrafascial hysterectomy will be 12/29. The patient will return closer to the date of surgery for preoperative visit.  42 minutes of total time was spent for this patient encounter, including preparation, face-to-face counseling with the patient and coordination of care, and documentation of the encounter.  Jeral Pinch, MD  Division of Gynecologic Oncology  Department of Obstetrics and Gynecology   [facility]

## 2021-09-10 ENCOUNTER — Other Ambulatory Visit (HOSPITAL_COMMUNITY): Payer: Self-pay | Admitting: Radiation Oncology

## 2021-09-10 ENCOUNTER — Telehealth: Payer: Self-pay | Admitting: Gynecologic Oncology

## 2021-09-10 DIAGNOSIS — C539 Malignant neoplasm of cervix uteri, unspecified: Secondary | ICD-10-CM

## 2021-09-10 NOTE — Telephone Encounter (Signed)
Patient called the office with questions about the plan moving forward. She states she wants what is best for her health and she is willing to proceed with scheduling treatments etc into January 2023 if that is what is best. She is asking does she only need one tandem ring treatment? Advised patient I would discuss her questions with Dr. Berline Lopes and call her back with information/recommendations.

## 2021-09-11 ENCOUNTER — Encounter: Payer: Self-pay | Admitting: Hematology and Oncology

## 2021-09-11 ENCOUNTER — Telehealth: Payer: Self-pay | Admitting: Oncology

## 2021-09-11 NOTE — Progress Notes (Signed)
Please place orders for PAT appointment scheduled 09/12/21.

## 2021-09-11 NOTE — Telephone Encounter (Signed)
Left a message regarding upcoming plan for tandem and ring treatment followed by surgery. Requested a return call to discuss.

## 2021-09-11 NOTE — Progress Notes (Addendum)
COVID swab appointment: n/a  COVID Vaccine Completed: no Date COVID Vaccine completed: Has received booster: COVID vaccine manufacturer: [place]   Date of COVID positive in last 90 days: no  PCP - Tedra Senegal, MD Cardiologist - Gwyndolyn Kaufman, MD  Chest x-ray - n/a EKG - 09/12/21 Epic/chart Stress Test - n/a ECHO - 10/25/20 Epic Cardiac Cath - n/a Pacemaker/ICD device last checked: n/a Spinal Cord Stimulator: n/a  Sleep Study - n/a CPAP -   Fasting Blood Sugar - n/a Checks Blood Sugar _____ times a day  Blood Thinner Instructions:n/a Aspirin Instructions: Last Dose:  Activity level: Can go up a flight of stairs and perform activities of daily living without stopping and without symptoms of chest pain or shortness of breath.    Anesthesia review: port, during port insertion was not very sedated, CAD, seizures  Patient denies shortness of breath, fever, cough and chest pain at PAT appointment   Patient verbalized understanding of instructions that were given to them at the PAT appointment. Patient was also instructed that they will need to review over the PAT instructions again at home before surgery.

## 2021-09-11 NOTE — Patient Instructions (Addendum)
DUE TO COVID-19 ONLY ONE VISITOR IS ALLOWED TO COME WITH YOU AND STAY IN THE WAITING ROOM ONLY DURING PRE OP AND PROCEDURE.   **NO VISITORS ARE ALLOWED IN THE SHORT STAY AREA OR RECOVERY ROOM!!**       Your procedure is scheduled on: 09/26/21   Report to [facility] Main Entrance    Report to admitting at 5:15 AM   Call this number if you have problems the morning of surgery [phone]   Do not eat food :After Midnight.   May have liquids until 4:30 AM day of surgery  CLEAR LIQUID DIET  Foods Allowed                                                                     Foods Excluded  Water, Black Coffee and tea (no milk or creamer)            liquids that you cannot  Plain Jell-O in any flavor  (No red)                                    see through such as: Fruit ices (not with fruit pulp)                                            milk, soups, orange juice              Iced Popsicles (No red)                                               All solid food                                   Apple juices Sports drinks like Gatorade (No red) Lightly seasoned clear broth or consume(fat free) Sugar   Oral Hygiene is also important to reduce your risk of infection.                                    Remember - BRUSH YOUR TEETH THE MORNING OF SURGERY WITH YOUR REGULAR TOOTHPASTE   Take these medicines the morning of surgery with A SIP OF WATER: Prozac, Lamictal, Keppra, Synthroid, Oxycodone, Crestor                              You may not have any metal on your body including hair pins, jewelry, and body piercing             Do not wear make-up, lotions, powders, perfumes, or deodorant  Do not wear nail polish including gel and S&S, artificial/acrylic nails, or any other type of covering on natural nails including finger and toenails. If you have artificial nails, gel coating, etc.  that needs to be removed by a nail salon please have this removed prior to surgery or surgery may  need to be canceled/ delayed if the surgeon/ anesthesia feels like they are unable to be safely monitored.   Do not shave  48 hours prior to surgery.    Do not bring valuables to the hospital. [place].   Contacts, dentures or bridgework may not be worn into surgery.    Patients discharged on the day of surgery will not be allowed to drive home.              Please read over the following fact sheets you were given: IF YOU HAVE QUESTIONS ABOUT YOUR PRE-OP INSTRUCTIONS PLEASE CALL [facility] - Preparing for Surgery Before surgery, you can play an important role.  Because skin is not sterile, your skin needs to be as free of germs as possible.  You can reduce the number of germs on your skin by washing with CHG (chlorahexidine gluconate) soap before surgery.  CHG is an antiseptic cleaner which kills germs and bonds with the skin to continue killing germs even after washing. Please DO NOT use if you have an allergy to CHG or antibacterial soaps.  If your skin becomes reddened/irritated stop using the CHG and inform your nurse when you arrive at Short Stay. Do not shave (including legs and underarms) for at least 48 hours prior to the first CHG shower.  You may shave your face/neck.  Please follow these instructions carefully:  1.  Shower with CHG Soap the night before surgery and the  morning of surgery.  2.  If you choose to wash your hair, wash your hair first as usual with your normal  shampoo.  3.  After you shampoo, rinse your hair and body thoroughly to remove the shampoo.                             4.  Use CHG as you would any other liquid soap.  You can apply chg directly to the skin and wash.  Gently with a scrungie or clean washcloth.  5.  Apply the CHG Soap to your body ONLY FROM THE NECK DOWN.   Do   not use on face/ open                           Wound or open sores. Avoid contact with eyes, ears mouth and    genitals (private parts).                       Wash face,  Genitals (private parts) with your normal soap.             6.  Wash thoroughly, paying special attention to the area where your    surgery  will be performed.  7.  Thoroughly rinse your body with warm water from the neck down.  8.  DO NOT shower/wash with your normal soap after using and rinsing off the CHG Soap.                9.  Pat yourself dry with a clean towel.            10.  Wear clean pajamas.  11.  Place clean sheets on your bed the night of your first shower and do not  sleep with pets. Day of Surgery : Do not apply any lotions/deodorants the morning of surgery.  Please wear clean clothes to the hospital/surgery center.  FAILURE TO FOLLOW THESE INSTRUCTIONS MAY RESULT IN THE CANCELLATION OF YOUR SURGERY  PATIENT SIGNATURE_________________________________  NURSE SIGNATURE__________________________________  ________________________________________________________________________

## 2021-09-12 ENCOUNTER — Encounter (HOSPITAL_COMMUNITY)
Admission: RE | Admit: 2021-09-12 | Discharge: 2021-09-12 | Disposition: A | Discharge status: Home / Self Care | Payer: Self-pay | Source: Ambulatory Visit | Attending: Radiation Oncology | Admitting: Radiation Oncology

## 2021-09-12 ENCOUNTER — Ambulatory Visit (HOSPITAL_COMMUNITY): Payer: Self-pay

## 2021-09-12 ENCOUNTER — Other Ambulatory Visit: Payer: Self-pay | Admitting: Gynecologic Oncology

## 2021-09-12 ENCOUNTER — Encounter (HOSPITAL_COMMUNITY): Payer: Self-pay

## 2021-09-12 ENCOUNTER — Ambulatory Visit: Payer: Self-pay | Admitting: Radiation Oncology

## 2021-09-12 ENCOUNTER — Encounter: Payer: Self-pay | Admitting: Hematology and Oncology

## 2021-09-12 DIAGNOSIS — Z01810 Encounter for preprocedural cardiovascular examination: Secondary | ICD-10-CM | POA: Insufficient documentation

## 2021-09-12 DIAGNOSIS — C539 Malignant neoplasm of cervix uteri, unspecified: Secondary | ICD-10-CM

## 2021-09-12 NOTE — Telephone Encounter (Signed)
Ashley Hammond called back and reviewed plan for tandem and ring treatment on 09/26/21 followed by surgery on 10/24/21.  She verbalized understanding and agreement of plan.

## 2021-09-13 ENCOUNTER — Telehealth: Payer: Self-pay

## 2021-09-13 NOTE — Telephone Encounter (Signed)
error 

## 2021-09-16 ENCOUNTER — Encounter: Payer: Self-pay | Admitting: Hematology and Oncology

## 2021-09-18 ENCOUNTER — Telehealth: Payer: Self-pay | Admitting: Neurology

## 2021-09-18 NOTE — Telephone Encounter (Signed)
LVM for pt to call back to give updated insurance information.

## 2021-09-23 ENCOUNTER — Telehealth: Payer: Self-pay | Admitting: Neurology

## 2021-09-23 NOTE — Telephone Encounter (Signed)
Returned call and LVM for pt to call me back to give updated insurance information

## 2021-09-24 NOTE — Progress Notes (Signed)
  Radiation Oncology         [phone] ________________________________  Name: Ashley Hammond MRN: [id number]  Date: 09/26/2021  DOB: 1965/12/05  CC: Elby Showers, MD  Everitt Amber, MD  HDR BRACHYTHERAPY NOTE  DIAGNOSIS: The encounter diagnosis was Malignant neoplasm of cervix, unspecified site [facility]).   Squamous cell carcinoma of the cervix, Clinical Stage IB3  NARRATIVE: The patient was brought to the HDR suite. Identity was confirmed. All relevant records and images related to the planned course of therapy were reviewed. The patient freely provided informed written consent to proceed with treatment after reviewing the details related to the planned course of therapy. The consent form was witnessed and verified by the simulation staff. Then, the patient was set-up in a stable reproducible supine position for radiation therapy. The tandem ring system was accessed and fiducial markers were placed within the tandem and ring.   Simple treatment device note: While in the operating room the patient had construction of her custom tandem ring system. She will be treated with a 45 tandem/ring system. The patient had placement of a 60 mm tandem. A cervical ring with a small shielding was used for her treatment. A rectal paddle was also part of her custom set up device.  Verification simulation note: An AP and lateral film was obtained through the pelvis area. This was compared to the patient's planning films documenting accurate position of the tandem/ring system for treatment.  High-dose-rate brachytherapy treatment note:   The remote afterloading device was accessed through catheter system and attached to the tandem ring system. Patient then proceeded to undergo her first high-dose-rate treatment directed at the cervix. The patient was prescribed a dose of 5.5 gray to be delivered to the high risk clinical target volume. Patient was treated with 2 channels using 27 dwell positions. Treatment  time was 418.60 seconds. The patient tolerated the procedure well. After completion of her therapy, a radiation survey was performed documenting return of the iridium source into the GammaMed safe. The patient was then transferred to the nursing suite.  She then had removal of the rectal paddle followed by the tandem and ring system. The patient tolerated the removal well.  PLAN: The patient has now completed her radiation therapy.  Later this month patient will be taken to the operating room by Dr. Jeral Pinch for an extrafascial hysterectomy.  Routine follow-up in approximately 2 months.  ________________________________   -----------------------------------  Blair Promise, PhD, MD  This document serves as a record of services personally performed by Gery Pray, MD. It was created on his behalf by Roney Mans, a trained medical scribe. The creation of this record is based on the scribe's personal observations and the provider's statements to them. This document has been checked and approved by the attending provider.

## 2021-09-25 ENCOUNTER — Ambulatory Visit: Payer: Self-pay | Admitting: Radiation Oncology

## 2021-09-25 ENCOUNTER — Ambulatory Visit (HOSPITAL_BASED_OUTPATIENT_CLINIC_OR_DEPARTMENT_OTHER): Admit: 2021-09-25 | Payer: 59 | Admitting: Radiation Oncology

## 2021-09-25 ENCOUNTER — Encounter (HOSPITAL_BASED_OUTPATIENT_CLINIC_OR_DEPARTMENT_OTHER): Payer: Self-pay

## 2021-09-25 ENCOUNTER — Ambulatory Visit (HOSPITAL_COMMUNITY): Payer: Self-pay

## 2021-09-25 SURGERY — INSERTION, UTERINE TANDEM AND RING OR CYLINDER, FOR BRACHYTHERAPY
Anesthesia: General

## 2021-09-25 NOTE — H&P (Signed)
Radiation Oncology         [phone] ________________________________  History and physical examination  Name: Ashley Hammond MRN: [id number]  Date: 08/15/2021  DOB: [birth year]  XI:[id number], Cresenciano Lick, MD  No ref. provider found   REFERRING PHYSICIAN: No ref. provider found  DIAGNOSIS: The encounter diagnosis was Cancer of cervix ([place]).  Squamous cell carcinoma of the cervix, Clinical Stage IB3  HISTORY OF PRESENT ILLNESS::Ashley Hammond is a 55 y.o. female who was recently diagnosed with cervical cancer. The patient presented to her PCP this past July with symptoms of light vaginal bleeding since this past May, which became heavier in July. Pap smear performed by her PCP on 05/09/21 demonstrated high-risk HPV but was otherwise normal.  Due to the patients presenting symptoms of abnormal bleeding, the patient was referred for follow-up with her gynecologist, Dr. Talbert Nan. Endometrial biopsy performed on 05/20/21 revealed benign inactive atrophic endometrium negative for hyperplasia or malignancy.  Transvaginal ultrasound performed on 05/30/21 demonstrated the uterus to appear small and anteverted, and a thin appearing endometrium. Ovaries appeared normal bilaterally.   The patient continued to experience bleeding which prompted biopsies of the cervix and endocervix on 06/17/21 . Pathology revealed invasive squamous cell carcinoma of the cervix, and squamous cell carcinoma of the endocervix.  Following these findings, the patient met with Dr. Denman George on 06/26/21 to discuss surgical interventions. Following discussion, the patient agreed to proceed with the plan of radical hysterectomy and bilateral salpingectomy with SLN biopsy.  However, PET  performed on 07/11/21 demonstrated the cervix to appear barreled; measuring > 4.3 cm in the greatest dimension. Findings were consistent with a stage IB3 lesion. Given the staging, Dr. Denman George canceled the planned hysterectomy and BSO and  recommended the patient to proceed with a course of definitive chemoradiation.  Recommendations were also to consider extrafascial hysterectomy after one brachytherapy procedure and external beam radiation therapy.  The patient has recently completed her external beam radiation therapy for a cumulative dose of 45 Gray.  She also received radiosensitizing chemotherapy.  The patient was examined by Dr. Berline Lopes in the outpatient setting recently and the patient was noted to have a good response to her radiation therapy and plans are for an extrafascial hysterectomy approximately a month out from her brachytherapy procedure.  Exam under anesthesia will confirm that the patient is a candidate for extrafascial hysterectomy.  PREVIOUS RADIATION THERAPY: No  PAST MEDICAL HISTORY:  Past Medical History:  Diagnosis Date   Anxiety    Arthritis    Arthritis of ankle    Right   Cervical cancer (HCC)    Depression    Episode of loss of consciousness    Hypothyroidism    Insomnia    Pure hypercholesterolemia    Seizure ([place])     PAST SURGICAL HISTORY: Past Surgical History:  Procedure Laterality Date   ANKLE SURGERY Right    [facility]   IR IMAGING GUIDED PORT INSERTION  07/30/2021   WISDOM TOOTH EXTRACTION      FAMILY HISTORY:  Family History  Problem Relation Age of Onset   Heart attack Father    Diabetes Brother    Pancreatic cancer Maternal Grandmother    Ovarian cancer Other    Endometrial cancer Other    Colon cancer Other    Prostate cancer Other    Pancreatic cancer Other    Sudden death Neg Hx    Hyperlipidemia Neg Hx    Hypertension Neg Hx    Breast cancer Neg  Hx     SOCIAL HISTORY:  Social History   Tobacco Use   Smoking status: Former    Packs/day: 0.25    Years: 25.00    Pack years: 6.25    Types: Cigarettes    Quit date: 2016    Years since quitting: 6.9   Smokeless tobacco: Never   Tobacco comments:    20+ yrs  Vaping Use   Vaping Use: Never used  Substance Use  Topics   Alcohol use: Yes    Comment: 14 drinks a week of wine   Drug use: Yes    Frequency: 7.0 times per week    Types: Marijuana    Comment: Last use 07-10-21    ALLERGIES: No Known Allergies  MEDICATIONS:  No current facility-administered medications for this encounter.   Current Outpatient Medications  Medication Sig Dispense Refill   ALPRAZolam (XANAX) 0.5 MG tablet Take 1 tablet (0.5 mg total) by mouth at bedtime as needed for anxiety. 90 tablet 1   Ascorbic Acid (VITAMIN C) 1000 MG tablet Take 1,000 mg by mouth daily.     Brimonidine Tartrate (LUMIFY) 0.025 % SOLN Place 1 drop into both eyes daily.     diphenoxylate-atropine (LOMOTIL) 2.5-0.025 MG tablet Take 1 tablet by mouth 4 (four) times daily as needed for diarrhea or loose stools. 30 tablet 0   FLUoxetine (PROZAC) 10 MG capsule TAKE ONE CAPSULE BY MOUTH DAILY 90 capsule 0   lamoTRIgine (LAMICTAL) 25 MG tablet Take 1 tablet (25 mg total) by mouth 2 (two) times daily. 180 tablet 3   levETIRAcetam (KEPPRA) 500 MG tablet Take 1 tablet (500 mg total) by mouth 2 (two) times daily. 180 tablet 3   levothyroxine (SYNTHROID) 50 MCG tablet TAKE ONE TABLET BY MOUTH DAILY ON AN EMPTY STOMACH 30 MINUTES BEFORE BREAKFAST 90 tablet 0   lidocaine-prilocaine (EMLA) cream Apply to affected area once (Patient taking differently: Apply 1 application topically daily as needed (port). Apply to affected area once) 30 g 3   loperamide (IMODIUM) 2 MG capsule Take 2 mg by mouth as needed for diarrhea or loose stools.     Magnesium 400 MG CAPS Take 400 mg by mouth daily.     ondansetron (ZOFRAN) 8 MG tablet Take 1 tablet (8 mg total) by mouth every 8 (eight) hours as needed. 30 tablet 1   oxyCODONE (OXY IR/ROXICODONE) 5 MG immediate release tablet Take 1 tablet (5 mg total) by mouth every 4 (four) hours as needed for severe pain. 30 tablet 0   prochlorperazine (COMPAZINE) 10 MG tablet Take 1 tablet (10 mg total) by mouth every 6 (six) hours as needed  for nausea or vomiting. 30 tablet 1   rosuvastatin (CRESTOR) 10 MG tablet Take 10 mg by mouth daily.     zinc gluconate 50 MG tablet Take 50 mg by mouth daily.     zolpidem (AMBIEN) 10 MG tablet TAKE ONE TABLET BY MOUTH EVERY NIGHT AT BEDTIME AS NEEDED 30 tablet 2   ibuprofen (ADVIL) 800 MG tablet Take 800 mg by mouth every 8 (eight) hours as needed for mild pain or moderate pain.     traMADol (ULTRAM) 50 MG tablet Take 50 mg by mouth every 6 (six) hours as needed for moderate pain.      REVIEW OF SYSTEMS:  A 10+ POINT REVIEW OF SYSTEMS WAS OBTAINED including neurology, dermatology, psychiatry, cardiac, respiratory, lymph, extremities, GI, GU, musculoskeletal, constitutional, reproductive, HEENT.  Vaginal bleeding has subsided.  PHYSICAL EXAM:  General: Alert and oriented, in no acute distress HEENT: Head is normocephalic. Extraocular movements are intact.  Neck: Neck is supple, no palpable cervical or supraclavicular lymphadenopathy. Heart: Regular in rate and rhythm with no murmurs, rubs, or gallops. Chest: Clear to auscultation bilaterally, with no rhonchi, wheezes, or rales. Abdomen: Soft, nontender, nondistended, with no rigidity or guarding. Extremities: No cyanosis or edema. Lymphatics: see Neck Exam Skin: No concerning lesions. Musculoskeletal: symmetric strength and muscle tone throughout. Neurologic: Cranial nerves II through XII are grossly intact. No obvious focalities. Speech is fluent. Coordination is intact. Psychiatric: Judgment and insight are intact. Affect is appropriate. On pelvic examination the external genitalia were unremarkable. A speculum exam was performed. There are no mucosal lesions noted in the vaginal vault.  View of the cervix is obtained showing changes along the anterior cervix suspicious for malignancy.  On bimanual examination and rectovaginal examination the cervix is quite firm and estimated to at least 4 cm in greatest dimension.  No obvious parametrial  extension noted on rectovaginal examination.  Rectal sphincter tone is normal.   LABORATORY DATA:  Lab Results  Component Value Date   WBC 3.0 (L) 08/28/2021   HGB 11.8 (L) 08/28/2021   HCT 33.2 (L) 08/28/2021   MCV 94.1 08/28/2021   PLT 107 (L) 08/28/2021   NEUTROABS 2.1 08/28/2021   Lab Results  Component Value Date   NA 139 08/28/2021   K 4.1 08/28/2021   CL 104 08/28/2021   CO2 28 08/28/2021   GLUCOSE 112 (H) 08/28/2021   CREATININE 0.79 08/28/2021   CALCIUM 9.4 08/28/2021      RADIOGRAPHY: No results found.    IMPRESSION: Squamous cell carcinoma of the cervix, clinical stage IB-3  The patient has completed her planned course of pelvic radiation therapy along with radiosensitizing chemotherapy.  Per recent pelvic exam she has had a good response to her treatment.  PLAN: The patient will be taken to the operating room on December 1 for exam under anesthesia by Dr. Berline Lopes.  She will then proceed to undergo placement of tandem ring in preparation for high-dose-rate radiation therapy. She will receive 5.5 Pearline Cables to the high risk clinical target volume.  Iridium 192 will be the high-dose-rate source.  Later in the month the patient will proceed with extrafascial hysterectomy under the direction of Dr. Jeral Pinch. ------------------------------------------------  Blair Promise, PhD, MD

## 2021-09-25 NOTE — Anesthesia Preprocedure Evaluation (Addendum)
Anesthesia Evaluation  Patient identified by MRN, date of birth, ID band Patient awake    Reviewed: Allergy & Precautions, NPO status , Patient's Chart, lab work & pertinent test results  History of Anesthesia Complications Negative for: history of anesthetic complications  Airway Mallampati: II  TM Distance: >3 FB Neck ROM: Full    Dental no notable dental hx. (+) Dental Advisory Given   Pulmonary neg pulmonary ROS, former smoker,    Pulmonary exam normal        Cardiovascular negative cardio ROS Normal cardiovascular exam  IMPRESSIONS   1. Left ventricular ejection fraction, by estimation, is 65 to 70%. Left ventricular ejection fraction by 3D volume is 70 %. The left ventricle has normal function. The left ventricle has no regional wall motion abnormalities. Left ventricular diastolic parameters are consistent with Grade I diastolic dysfunction (impaired relaxation). 2. Right ventricular systolic function is normal. The right ventricular size is normal. There is normal pulmonary artery systolic pressure. 3. The mitral valve is normal in structure. Mild mitral valve regurgitation. No evidence of mitral stenosis. 4. The aortic valve is normal in structure. Aortic valve regurgitation is mild. No aortic stenosis is present. Aortic regurgitation PHT measures 582 msec. 5. Aortic dilatation noted. There is mild dilatation of the ascending aorta, measuring 38 mm. 6. The inferior vena cava is normal in size with greater than 50% respiratory variability, suggesting right atrial pressure of 3 mmHg.   Neuro/Psych Seizures -,  PSYCHIATRIC DISORDERS Anxiety Depression    GI/Hepatic negative GI ROS, Neg liver ROS,   Endo/Other  Hypothyroidism   Renal/GU negative Renal ROS     Musculoskeletal  (+) Arthritis ,   Abdominal   Peds  Hematology negative hematology ROS (+) anemia ,   Anesthesia Other Findings    Reproductive/Obstetrics                            Anesthesia Physical Anesthesia Plan  ASA: 2  Anesthesia Plan: General   Post-op Pain Management: Celebrex PO (pre-op) and Tylenol PO (pre-op)   Induction:   PONV Risk Score and Plan: 4 or greater and Ondansetron, Dexamethasone, Midazolam and Treatment may vary due to age or medical condition  Airway Management Planned: LMA  Additional Equipment:   Intra-op Plan:   Post-operative Plan: Extubation in OR  Informed Consent: I have reviewed the patients History and Physical, chart, labs and discussed the procedure including the risks, benefits and alternatives for the proposed anesthesia with the patient or authorized representative who has indicated his/her understanding and acceptance.     Dental advisory given  Plan Discussed with: Anesthesiologist and CRNA  Anesthesia Plan Comments:        Anesthesia Quick Evaluation

## 2021-09-26 ENCOUNTER — Ambulatory Visit
Admission: RE | Admit: 2021-09-26 | Discharge: 2021-09-26 | Disposition: A | Discharge status: Home / Self Care | Payer: BC Managed Care – PPO | Source: Ambulatory Visit | Attending: Radiation Oncology | Admitting: Radiation Oncology

## 2021-09-26 ENCOUNTER — Ambulatory Visit (HOSPITAL_COMMUNITY)
Admission: RE | Admit: 2021-09-26 | Discharge: 2021-09-26 | Disposition: A | Discharge status: Home / Self Care | Payer: BC Managed Care – PPO | Source: Ambulatory Visit | Attending: Radiation Oncology | Admitting: Radiation Oncology

## 2021-09-26 ENCOUNTER — Encounter (HOSPITAL_COMMUNITY)
Admission: RE | Disposition: A | Discharge status: Home / Self Care | Payer: Self-pay | Source: Ambulatory Visit | Attending: Radiation Oncology

## 2021-09-26 ENCOUNTER — Ambulatory Visit (HOSPITAL_COMMUNITY): Payer: BC Managed Care – PPO

## 2021-09-26 ENCOUNTER — Ambulatory Visit (HOSPITAL_COMMUNITY): Payer: BC Managed Care – PPO | Admitting: Physician Assistant

## 2021-09-26 ENCOUNTER — Encounter (HOSPITAL_COMMUNITY): Payer: Self-pay | Admitting: Radiation Oncology

## 2021-09-26 VITALS — BP 123/78 | HR 76 | Temp 97.5°F | Resp 22

## 2021-09-26 DIAGNOSIS — C530 Malignant neoplasm of endocervix: Secondary | ICD-10-CM | POA: Insufficient documentation

## 2021-09-26 DIAGNOSIS — Z87891 Personal history of nicotine dependence: Secondary | ICD-10-CM | POA: Diagnosis not present

## 2021-09-26 DIAGNOSIS — C539 Malignant neoplasm of cervix uteri, unspecified: Secondary | ICD-10-CM

## 2021-09-26 HISTORY — PX: OPERATIVE ULTRASOUND: SHX5996

## 2021-09-26 HISTORY — PX: TANDEM RING INSERTION: SHX6199

## 2021-09-26 SURGERY — INSERTION, UTERINE TANDEM AND RING OR CYLINDER, FOR BRACHYTHERAPY
Anesthesia: General

## 2021-09-26 MED ORDER — MIDAZOLAM HCL 5 MG/5ML IJ SOLN
INTRAMUSCULAR | Status: DC | PRN
Start: 2021-09-26 — End: 2021-09-26
  Administered 2021-09-26: 2 mg via INTRAVENOUS

## 2021-09-26 MED ORDER — SODIUM CHLORIDE 0.9 % IR SOLN
Status: DC | PRN
  Administered 2021-09-26: 1000 mL

## 2021-09-26 MED ORDER — HYDROMORPHONE HCL 1 MG/ML IJ SOLN
0.5000 mg | Freq: Once | INTRAMUSCULAR | Status: AC
  Administered 2021-09-26: 0.5 mg via INTRAVENOUS
  Filled 2021-09-26: qty 1

## 2021-09-26 MED ORDER — ONDANSETRON HCL 4 MG/2ML IJ SOLN
INTRAMUSCULAR | Status: DC | PRN
  Administered 2021-09-26: 4 mg via INTRAVENOUS

## 2021-09-26 MED ORDER — ORAL CARE MOUTH RINSE: 15.0000 mL | Freq: Once | OROMUCOSAL | Status: AC

## 2021-09-26 MED ORDER — ONDANSETRON HCL 4 MG/2ML IJ SOLN
INTRAMUSCULAR | Status: AC
  Filled 2021-09-26: qty 2

## 2021-09-26 MED ORDER — MIDAZOLAM HCL 2 MG/2ML IJ SOLN
INTRAMUSCULAR | Status: AC
  Filled 2021-09-26: qty 2

## 2021-09-26 MED ORDER — SODIUM CHLORIDE 0.9% FLUSH
10.0000 mL | Freq: Once | INTRAVENOUS | Status: AC
  Administered 2021-09-26: 10 mL via INTRAVENOUS

## 2021-09-26 MED ORDER — FENTANYL CITRATE (PF) 100 MCG/2ML IJ SOLN
INTRAMUSCULAR | Status: AC
  Filled 2021-09-26: qty 2

## 2021-09-26 MED ORDER — PROPOFOL 10 MG/ML IV BOLUS
INTRAVENOUS | Status: AC
  Filled 2021-09-26: qty 20

## 2021-09-26 MED ORDER — LIDOCAINE HCL (PF) 2 % IJ SOLN
INTRAMUSCULAR | Status: AC
  Filled 2021-09-26: qty 5

## 2021-09-26 MED ORDER — HEPARIN SOD (PORK) LOCK FLUSH 100 UNIT/ML IV SOLN
500.0000 [IU] | Freq: Once | INTRAVENOUS | Status: AC
  Administered 2021-09-26: 500 [IU] via INTRAVENOUS

## 2021-09-26 MED ORDER — LIDOCAINE 2% (20 MG/ML) 5 ML SYRINGE
INTRAMUSCULAR | Status: DC | PRN
  Administered 2021-09-26: 60 mg via INTRAVENOUS

## 2021-09-26 MED ORDER — AMISULPRIDE (ANTIEMETIC) 5 MG/2ML IV SOLN: 10.0000 mg | Freq: Once | INTRAVENOUS | Status: DC | PRN

## 2021-09-26 MED ORDER — SCOPOLAMINE 1 MG/3DAYS TD PT72
1.0000 | MEDICATED_PATCH | TRANSDERMAL | Status: DC
  Administered 2021-09-26: 1.5 mg via TRANSDERMAL
  Filled 2021-09-26: qty 1

## 2021-09-26 MED ORDER — CHLORHEXIDINE GLUCONATE 0.12 % MT SOLN
15.0000 mL | Freq: Once | OROMUCOSAL | Status: AC
  Administered 2021-09-26: 15 mL via OROMUCOSAL

## 2021-09-26 MED ORDER — HYDROMORPHONE HCL 2 MG/ML IJ SOLN
INTRAMUSCULAR | Status: AC
  Filled 2021-09-26: qty 1

## 2021-09-26 MED ORDER — EPHEDRINE 5 MG/ML INJ
INTRAVENOUS | Status: AC
  Filled 2021-09-26: qty 5

## 2021-09-26 MED ORDER — FENTANYL CITRATE PF 50 MCG/ML IJ SOSY: 25.0000 ug | PREFILLED_SYRINGE | INTRAMUSCULAR | Status: DC | PRN

## 2021-09-26 MED ORDER — PROMETHAZINE HCL 25 MG/ML IJ SOLN: 6.2500 mg | INTRAMUSCULAR | Status: DC | PRN

## 2021-09-26 MED ORDER — PROPOFOL 10 MG/ML IV BOLUS
INTRAVENOUS | Status: DC | PRN
  Administered 2021-09-26: 200 mg via INTRAVENOUS

## 2021-09-26 MED ORDER — SODIUM CHLORIDE (PF) 0.9 % IJ SOLN
INTRAMUSCULAR | Status: AC
  Filled 2021-09-26: qty 10

## 2021-09-26 MED ORDER — DEXAMETHASONE SODIUM PHOSPHATE 10 MG/ML IJ SOLN
INTRAMUSCULAR | Status: DC | PRN
  Administered 2021-09-26: 6 mg via INTRAVENOUS

## 2021-09-26 MED ORDER — LACTATED RINGERS IV SOLN: INTRAVENOUS | Status: DC

## 2021-09-26 MED ORDER — DEXAMETHASONE SODIUM PHOSPHATE 10 MG/ML IJ SOLN
INTRAMUSCULAR | Status: AC
  Filled 2021-09-26: qty 1

## 2021-09-26 MED ORDER — EPHEDRINE SULFATE-NACL 50-0.9 MG/10ML-% IV SOSY
PREFILLED_SYRINGE | INTRAVENOUS | Status: DC | PRN
  Administered 2021-09-26 (×2): 5 mg via INTRAVENOUS

## 2021-09-26 MED ORDER — ACETAMINOPHEN 500 MG PO TABS
1000.0000 mg | ORAL_TABLET | Freq: Once | ORAL | Status: AC
  Administered 2021-09-26: 1000 mg via ORAL
  Filled 2021-09-26: qty 2

## 2021-09-26 MED ORDER — 0.9 % SODIUM CHLORIDE (POUR BTL) OPTIME
TOPICAL | Status: DC | PRN
  Administered 2021-09-26: 1000 mL

## 2021-09-26 MED ORDER — FENTANYL CITRATE (PF) 100 MCG/2ML IJ SOLN
INTRAMUSCULAR | Status: DC | PRN
  Administered 2021-09-26 (×2): 50 ug via INTRAVENOUS

## 2021-09-26 MED ORDER — CELECOXIB 200 MG PO CAPS
200.0000 mg | ORAL_CAPSULE | Freq: Once | ORAL | Status: AC
  Administered 2021-09-26: 200 mg via ORAL
  Filled 2021-09-26: qty 1

## 2021-09-26 SURGICAL SUPPLY — 34 items
BAG COUNTER SPONGE SURGICOUNT (BAG) IMPLANT
BAG DRN RND TRDRP ANRFLXCHMBR (UROLOGICAL SUPPLIES)
BAG SPNG CNTER NS LX DISP (BAG)
BAG URINE DRAIN 2000ML AR STRL (UROLOGICAL SUPPLIES) ×1 IMPLANT
CATH FOLEY 2WAY SLVR  5CC 16FR (CATHETERS)
CATH FOLEY 2WAY SLVR 5CC 16FR (CATHETERS) IMPLANT
CATH FOLEY 3WAY  5CC 16FR (CATHETERS)
CATH FOLEY 3WAY 5CC 16FR (CATHETERS) IMPLANT
COVER BACK TABLE 60X90IN (DRAPES) ×2 IMPLANT
DILATOR CANAL MILEX (MISCELLANEOUS) ×1 IMPLANT
DRAPE SHEET LG 3/4 BI-LAMINATE (DRAPES) ×2 IMPLANT
DRAPE UNDERBUTTOCKS STRL (DISPOSABLE) ×2 IMPLANT
DRSG PAD ABDOMINAL 8X10 ST (GAUZE/BANDAGES/DRESSINGS) ×3 IMPLANT
GAUZE 4X4 16PLY ~~LOC~~+RFID DBL (SPONGE) ×2 IMPLANT
GLOVE SURG ENC MOIS LTX SZ6 (GLOVE) IMPLANT
GLOVE SURG ENC MOIS LTX SZ7.5 (GLOVE) ×3 IMPLANT
GOWN STRL REUS W/TWL LRG LVL3 (GOWN DISPOSABLE) ×2 IMPLANT
KIT BASIN OR (CUSTOM PROCEDURE TRAY) ×2 IMPLANT
LEGGING LITHOTOMY PAIR STRL (DRAPES) ×2 IMPLANT
MAT PREVALON FULL STRYKER (MISCELLANEOUS) ×2 IMPLANT
PACKING VAGINAL (PACKING) ×1 IMPLANT
PENCIL SMOKE EVACUATOR (MISCELLANEOUS) IMPLANT
PLUG CATH AND CAP STER (CATHETERS) IMPLANT
SET IRRIG Y TYPE TUR BLADDER L (SET/KITS/TRAYS/PACK) IMPLANT
SURGILUBE 2OZ TUBE FLIPTOP (MISCELLANEOUS) ×2 IMPLANT
SUT PROLENE 2 0 CT2 30 (SUTURE) IMPLANT
SUT VIC AB 0 CT1 27 (SUTURE)
SUT VIC AB 0 CT1 27XBRD ANTBC (SUTURE) IMPLANT
SYR 10ML LL (SYRINGE) ×2 IMPLANT
TOWEL OR 17X26 10 PK STRL BLUE (TOWEL DISPOSABLE) ×2 IMPLANT
TRAY FOLEY MTR SLVR 16FR STAT (SET/KITS/TRAYS/PACK) ×1 IMPLANT
UNDERPAD 30X36 HEAVY ABSORB (UNDERPADS AND DIAPERS) ×3 IMPLANT
WATER STERILE IRR 500ML POUR (IV SOLUTION) ×2 IMPLANT
YANKAUER SUCT BULB TIP 10FT TU (MISCELLANEOUS) ×1 IMPLANT

## 2021-09-26 NOTE — Interval H&P Note (Signed)
History and Physical Interval Note:  09/26/2021 7:08 AM  Ashley Hammond  has presented today for surgery, with the diagnosis of CERVICAL CANCER.  The various methods of treatment have been discussed with the patient and family. After consideration of risks, benefits and other options for treatment, the patient has consented to  Procedure(s): TANDEM RING INSERTION (N/A) OPERATIVE ULTRASOUND (N/A) as a surgical intervention.  The patient's history has been reviewed, patient examined, no change in status, stable for surgery.  I have reviewed the patient's chart and labs.  Questions were answered to the patient's satisfaction.     Gery Pray

## 2021-09-26 NOTE — Patient Instructions (Signed)
IMMEDIATELY FOLLOWING SURGERY: Do not drive or operate machinery for the first twenty four hours after surgery. Do not make any important decisions for twenty four hours after surgery or while taking narcotic pain medications or sedatives. If you develop intractable nausea and vomiting or a severe headache please notify your doctor immediately.   FOLLOW-UP: You do not need to follow up with anesthesia unless specifically instructed to do so.   WOUND CARE INSTRUCTIONS (if applicable): Expect some mild vaginal bleeding, but if large amount of bleeding occurs please contact Dr. Sondra Come at [phone] or the Radiation On-Call physician. Call for any fever greater than 101.0 degrees or increasing vaginal//abdominal pain or trouble urinating.   QUESTIONS?: Please feel free to call your physician or the hospital operator if you have any questions, and they will be happy to assist you. Resume all medications: as listed on your after visit summary. Your next appointment is:  Future Appointments  Date Time Provider [place]  09/26/2021  1:00 PM Gery Pray, MD [facility] None  09/30/2021 10:30 AM [facility] 2 [facility] [place]  10/07/2021 10:30 AM [facility] 2 [facility] [place]  10/15/2021  2:00 PM [facility] PAT 1 [facility] None

## 2021-09-26 NOTE — Anesthesia Postprocedure Evaluation (Signed)
Anesthesia Post Note  Patient: Ashley Hammond  Procedure(s) Performed: TANDEM RING INSERTION OPERATIVE ULTRASOUND     Patient location during evaluation: PACU Anesthesia Type: General Level of consciousness: sedated Pain management: pain level controlled Vital Signs Assessment: post-procedure vital signs reviewed and stable Respiratory status: spontaneous breathing and respiratory function stable Cardiovascular status: stable Postop Assessment: no apparent nausea or vomiting Anesthetic complications: no   No notable events documented.  Last Vitals:  Vitals:   09/26/21 0845 09/26/21 0900  BP: 124/69 119/68  Pulse: 77 72  Resp: 12 13  Temp:  36.6 C  SpO2: 100% 97%    Last Pain:  Vitals:   09/26/21 0900  TempSrc:   PainSc: 0-No pain                 Kennen Stammer DANIEL

## 2021-09-26 NOTE — Anesthesia Procedure Notes (Signed)
Procedure Name: LMA Insertion Date/Time: 09/26/2021 7:30 AM Performed by: Sharlette Dense, CRNA Patient Re-evaluated:Patient Re-evaluated prior to induction Oxygen Delivery Method: Circle system utilized Preoxygenation: Pre-oxygenation with 100% oxygen Induction Type: IV induction Ventilation: Mask ventilation without difficulty LMA: LMA inserted LMA Size: 4.0 Number of attempts: 2 Placement Confirmation: positive ETCO2 and breath sounds checked- equal and bilateral Tube secured with: Tape Dental Injury: Teeth and Oropharynx as per pre-operative assessment

## 2021-09-26 NOTE — Transfer of Care (Signed)
Immediate Anesthesia Transfer of Care Note  Patient: Ashley Hammond  Procedure(s) Performed: TANDEM RING INSERTION OPERATIVE ULTRASOUND  Patient Location: PACU  Anesthesia Type:General  Level of Consciousness: awake, alert  and oriented  Airway & Oxygen Therapy: Patient Spontanous Breathing and Patient connected to face mask oxygen  Post-op Assessment: Report given to RN and Post -op Vital signs reviewed and stable  Post vital signs: Reviewed and stable  Last Vitals:  Vitals Value Taken Time  BP 120/91 09/26/21 0833  Temp    Pulse 83 09/26/21 0834  Resp 16 09/26/21 0834  SpO2 100 % 09/26/21 0834  Vitals shown include unvalidated device data.  Last Pain:  Vitals:   09/26/21 0614  TempSrc: Oral  PainSc:          Complications: No notable events documented.

## 2021-09-26 NOTE — Op Note (Signed)
09/26/2021  8:47 AM  PATIENT:  Ashley Hammond  55 y.o. female  PRE-OPERATIVE DIAGNOSIS:  CERVICAL CANCER  POST-OPERATIVE DIAGNOSIS:  CERVICAL CANCER  PROCEDURE:  Procedure(s): TANDEM RING INSERTION (N/A) OPERATIVE ULTRASOUND (N/A)  SURGEON:  Surgeon(s) and Role:    * Gery Pray, MD - Primary  PHYSICIAN ASSISTANT:   ASSISTANTS: none   ANESTHESIA:   general  EBL: 5 mL  BLOOD ADMINISTERED:none  DRAINS: Foley catheter  LOCAL MEDICATIONS USED:  NONE  SPECIMEN:  No Specimen  DISPOSITION OF SPECIMEN:  N/A  COUNTS:  YES  TOURNIQUET:  * No tourniquets in log *  DICTATION: The patient was taken to [facility] main OR room 2.  A general anesthetic was applied.  Timeout for the procedure was performed including procedure, preoperative antibiotics, allergies and estimated length of the procedure.  The patient was prepped and draped in the usual sterile fashion and placed in the dorsolithotomy position.  A Foley catheter was placed.  Later during the procedure this was backfilled with approximately 250 cc of saline for ultrasound imaging purposes.  She proceeded to undergo exam under anesthesia and was noted to have a good response to her external beam and radiosensitizing chemotherapy.  A speculum exam was performed.  There was noted to be a approximately 1 cm polypoid appearing lesion at the cervical os.  There was some erythema noted to the anterior lip of the cervix.  On bimanual and rectovaginal examination the cervix was firm but decreased in size and estimated to be approximately 3 x 3 cm on rectovaginal examination.  There was no parametrial extension noted.  Rectal sphincter tone was normal.  Patient proceeded to undergo dilation and sounding of the uterus.  This was initially difficult with some stenosis noted in the midportion of the endometrial cavity.  This eventually was transversed and the sound was seen well on ultrasound imaging at the fundus.  The uterus was estimated  to be approximately 8 cm in length.  She then proceeded to undergo serial dilation of the cervical os.  Minimal bleeding was noted.  She then had a 60 mm cervical sleeve placed in the endocervical and endometrial canal.  This was then followed by placement of a 60 mm 45 degree tandem.  She then had placement of a 45 degree ring with a small shielding cap in place.  This was then followed by placement of a rectal paddle posteriorly to limit dose to the rectal mucosa.  She tolerated the procedure well.  She was subsequently transferred to the recovery room in stable condition.  Later in the day the patient will be brought down to radiation oncology for planning and her first and only high-dose-rate treatment.  Exam today also confirms that the patient would be a candidate for extrafascial hysterectomy.  This has already been scheduled for later this month with Dr. Berline Lopes who examined the patient earlier this month and felt she would be a good candidate for this surgery.  PLAN OF CARE transfer to radiation oncology for planning and treatment   PATIENT DISPOSITION:  PACU - hemodynamically stable.   Delay start of Pharmacological VTE agent (>24hrs) due to surgical blood loss or risk of bleeding: not applicable when men

## 2021-09-27 ENCOUNTER — Encounter (HOSPITAL_COMMUNITY): Payer: Self-pay | Admitting: Radiation Oncology

## 2021-09-30 ENCOUNTER — Encounter (HOSPITAL_BASED_OUTPATIENT_CLINIC_OR_DEPARTMENT_OTHER): Payer: Self-pay

## 2021-09-30 ENCOUNTER — Ambulatory Visit (HOSPITAL_COMMUNITY): Payer: BC Managed Care – PPO

## 2021-09-30 ENCOUNTER — Ambulatory Visit: Payer: BC Managed Care – PPO | Admitting: Radiation Oncology

## 2021-09-30 ENCOUNTER — Ambulatory Visit (HOSPITAL_BASED_OUTPATIENT_CLINIC_OR_DEPARTMENT_OTHER): Admit: 2021-09-30 | Payer: BC Managed Care – PPO | Admitting: Radiation Oncology

## 2021-09-30 SURGERY — INSERTION, UTERINE TANDEM AND RING OR CYLINDER, FOR BRACHYTHERAPY
Anesthesia: General

## 2021-10-01 ENCOUNTER — Telehealth: Payer: Self-pay | Admitting: Neurology

## 2021-10-01 NOTE — Telephone Encounter (Signed)
We have attempted to call the patient 2 times to schedule sleep study. Patient has been unavailable at the phone numbers we have on file and has not returned our calls. If patient calls back we will schedule them for their sleep study. ° °

## 2021-10-07 ENCOUNTER — Ambulatory Visit (HOSPITAL_BASED_OUTPATIENT_CLINIC_OR_DEPARTMENT_OTHER): Admit: 2021-10-07 | Payer: BC Managed Care – PPO | Admitting: Radiation Oncology

## 2021-10-07 ENCOUNTER — Ambulatory Visit (HOSPITAL_COMMUNITY): Payer: BC Managed Care – PPO

## 2021-10-07 ENCOUNTER — Ambulatory Visit: Payer: BC Managed Care – PPO | Admitting: Radiation Oncology

## 2021-10-07 ENCOUNTER — Encounter (HOSPITAL_BASED_OUTPATIENT_CLINIC_OR_DEPARTMENT_OTHER): Payer: Self-pay

## 2021-10-07 SURGERY — INSERTION, UTERINE TANDEM AND RING OR CYLINDER, FOR BRACHYTHERAPY
Anesthesia: General

## 2021-10-11 ENCOUNTER — Other Ambulatory Visit: Payer: Self-pay | Admitting: Internal Medicine

## 2021-10-11 ENCOUNTER — Telehealth: Payer: Self-pay | Admitting: *Deleted

## 2021-10-11 NOTE — Telephone Encounter (Signed)
Patient called and stated "I'm not sure what to do, but I tested positive for COVID last night with a home test." Explained to the patient that since her surgery is on 12/29 she could keep that day because she would be 10 days out from the test. Explained that the pre op appt would have to be moved to the same week. Patient stated "I have new insurance now and I'm not pressed for have the surgery before the end of the year. I know the doctors said it would be best to wait 6 weeks after radiation to do the surgery; I am fine with that. Whatever they think it best. So can you check with them first before moving things." Explained that the message would be given to Dr Berline Lopes and [facility] Sciara APP. Explained that the office would call her back later today or next week.

## 2021-10-14 ENCOUNTER — Telehealth: Payer: Self-pay | Admitting: Oncology

## 2021-10-14 NOTE — Telephone Encounter (Signed)
Ashley Hammond called back and said she wants to keep surgery on 10/24/21.  She is wondering how to reschedule her preop appointment.  Left message for presurgical testing at [facility] to reschedule preop appointment to next week.

## 2021-10-14 NOTE — Telephone Encounter (Signed)
Left a message regarding surgery.  Requested a return call. 

## 2021-10-15 ENCOUNTER — Encounter (HOSPITAL_COMMUNITY): Payer: BC Managed Care – PPO

## 2021-10-17 ENCOUNTER — Other Ambulatory Visit: Payer: Self-pay

## 2021-10-17 ENCOUNTER — Encounter (HOSPITAL_COMMUNITY): Admission: RE | Admit: 2021-10-17 | Payer: BC Managed Care – PPO | Source: Ambulatory Visit

## 2021-10-17 ENCOUNTER — Encounter (HOSPITAL_COMMUNITY)
Admission: RE | Admit: 2021-10-17 | Discharge: 2021-10-17 | Disposition: A | Discharge status: Home / Self Care | Payer: BC Managed Care – PPO | Source: Ambulatory Visit | Attending: Gynecologic Oncology | Admitting: Gynecologic Oncology

## 2021-10-17 ENCOUNTER — Encounter (HOSPITAL_COMMUNITY): Payer: Self-pay

## 2021-10-17 NOTE — Progress Notes (Signed)
Interview completed over the phone. Patient will be getting blood work done morning of surgery. Went over instructions verbally and emailed to patients email preference. She will purchase a pre Surgical Ensure (or G2) to drink morning of surgery. Patient will call back with any questions  COVID swab appointment: n/a  COVID Vaccine Completed: no Date COVID Vaccine completed: Has received booster: COVID vaccine manufacturer: [place]   Date of COVID positive in last 90 days: positive home test 10/10/21  PCP - Tedra Senegal, MD Cardiologist - Gwyndolyn Kaufman, MD  Chest x-ray - n/a EKG - 11/12/20 Epic Stress Test - n/a ECHO - 10/25/20- Epic Cardiac Cath - n/a Pacemaker/ICD device last checked: n/a Spinal Cord Stimulator: n/a  Sleep Study - n/a CPAP -   Fasting Blood Sugar - n/a Checks Blood Sugar _____ times a day  Blood Thinner Instructions: n/a Aspirin Instructions: Last Dose:  Activity level: Can go up a flight of stairs and perform activities of daily living without stopping and without symptoms of chest pain or shortness of breath.    Anesthesia review: seizures, LOC  Patient denies shortness of breath, fever, cough and chest pain at PAT appointment   Patient verbalized understanding of instructions that were given to them at the PAT appointment. Patient was also instructed that they will need to review over the PAT instructions again at home before surgery.

## 2021-10-17 NOTE — Patient Instructions (Addendum)
DUE TO COVID-19 ONLY ONE VISITOR IS ALLOWED TO COME WITH YOU AND STAY IN THE WAITING ROOM ONLY DURING PRE OP AND PROCEDURE.   **NO VISITORS ARE ALLOWED IN THE SHORT STAY AREA OR RECOVERY ROOM!!**       Your procedure is scheduled on: 10/24/21   Report to [facility] Main Entrance    Report to admitting at 5:15 AM   Call this number if you have problems the morning of surgery [phone]   Do not eat food :After Midnight.   May have liquids until 4:30 AM day of surgery  CLEAR LIQUID DIET  Foods Allowed                                                                     Foods Excluded  Water, Black Coffee and tea (no milk or creamer)           liquids that you cannot  Plain Jell-O in any flavor  (No red)                                    see through such as: Fruit ices (not with fruit pulp)                                            milk, soups, orange juice              Iced Popsicles (No red)                                                All solid food                                   Apple juices Sports drinks like Gatorade (No red) Lightly seasoned clear broth or consume(fat free) Sugar     The day of surgery:  Drink ONE (1) Pre-Surgery Clear Ensure or G2 (Gatorade) by 4:30 am the morning of surgery. Drink in one sitting. Do not sip.  This drink was given to you during your hospital  pre-op appointment visit. Nothing else to drink after completing the  Pre-Surgery Clear Ensure.          If you have questions, please contact your surgeons office.     Oral Hygiene is also important to reduce your risk of infection.                                    Remember - BRUSH YOUR TEETH THE MORNING OF SURGERY WITH YOUR REGULAR TOOTHPASTE   Take these medicines the morning of surgery with A SIP OF WATER: Xanax, Fluoxetine, Lamotrigine, Keppra, Synthroid, Rosuvastatin, Oxycodone, Zofran  You may not have any metal on your body including hair  pins, jewelry, and body piercing             Do not wear make-up, lotions, powders, perfumes, or deodorant  Do not wear nail polish including gel and S&S, artificial/acrylic nails, or any other type of covering on natural nails including finger and toenails. If you have artificial nails, gel coating, etc. that needs to be removed by a nail salon please have this removed prior to surgery or surgery may need to be canceled/ delayed if the surgeon/ anesthesia feels like they are unable to be safely monitored.   Do not shave  48 hours prior to surgery.    Do not bring valuables to the hospital. [place].   Contacts, dentures or bridgework may not be worn into surgery.    Patients discharged on the day of surgery will not be allowed to drive home.              Please read over the following fact sheets you were given: IF YOU HAVE QUESTIONS ABOUT YOUR PRE-OP INSTRUCTIONS PLEASE CALL [facility] - Preparing for Surgery Before surgery, you can play an important role.  Because skin is not sterile, your skin needs to be as free of germs as possible.  You can reduce the number of germs on your skin by washing with CHG (chlorahexidine gluconate) soap before surgery.  CHG is an antiseptic cleaner which kills germs and bonds with the skin to continue killing germs even after washing. Please DO NOT use if you have an allergy to CHG or antibacterial soaps.  If your skin becomes reddened/irritated stop using the CHG and inform your nurse when you arrive at Short Stay. Do not shave (including legs and underarms) for at least 48 hours prior to the first CHG shower.  You may shave your face/neck.  Please follow these instructions carefully:  1.  Shower with CHG Soap the night before surgery and the  morning of surgery.  2.  If you choose to wash your hair, wash your hair first as usual with your normal  shampoo.  3.  After you shampoo,  rinse your hair and body thoroughly to remove the shampoo.                             4.  Use CHG as you would any other liquid soap.  You can apply chg directly to the skin and wash.  Gently with a scrungie or clean washcloth.  5.  Apply the CHG Soap to your body ONLY FROM THE NECK DOWN.   Do   not use on face/ open                           Wound or open sores. Avoid contact with eyes, ears mouth and   genitals (private parts).                       Wash face,  Genitals (private parts) with your normal soap.             6.  Wash thoroughly, paying special attention to the area where your    surgery  will be performed.  7.  Thoroughly  rinse your body with warm water from the neck down.  8.  DO NOT shower/wash with your normal soap after using and rinsing off the CHG Soap.                9.  Pat yourself dry with a clean towel.            10.  Wear clean pajamas.            11.  Place clean sheets on your bed the night of your first shower and do not  sleep with pets. Day of Surgery : Do not apply any lotions/deodorants the morning of surgery.  Please wear clean clothes to the hospital/surgery center.  FAILURE TO FOLLOW THESE INSTRUCTIONS MAY RESULT IN THE CANCELLATION OF YOUR SURGERY  PATIENT SIGNATURE_________________________________  NURSE SIGNATURE__________________________________  ________________________________________________________________________  WHAT IS A BLOOD TRANSFUSION? Blood Transfusion Information  A transfusion is the replacement of blood or some of its parts. Blood is made up of multiple cells which provide different functions. Red blood cells carry oxygen and are used for blood loss replacement. White blood cells fight against infection. Platelets control bleeding. Plasma helps clot blood. Other blood products are available for specialized needs, such as hemophilia or other clotting disorders. BEFORE THE TRANSFUSION  Who gives blood for transfusions?  Healthy  volunteers who are fully evaluated to make sure their blood is safe. This is blood bank blood. Transfusion therapy is the safest it has ever been in the practice of medicine. Before blood is taken from a donor, a complete history is taken to make sure that person has no history of diseases nor engages in risky social behavior (examples are intravenous drug use or sexual activity with multiple partners). The donor's travel history is screened to minimize risk of transmitting infections, such as malaria. The donated blood is tested for signs of infectious diseases, such as HIV and hepatitis. The blood is then tested to be sure it is compatible with you in order to minimize the chance of a transfusion reaction. If you or a relative donates blood, this is often done in anticipation of surgery and is not appropriate for emergency situations. It takes many days to process the donated blood. RISKS AND COMPLICATIONS Although transfusion therapy is very safe and saves many lives, the main dangers of transfusion include:  Getting an infectious disease. Developing a transfusion reaction. This is an allergic reaction to something in the blood you were given. Every precaution is taken to prevent this. The decision to have a blood transfusion has been considered carefully by your caregiver before blood is given. Blood is not given unless the benefits outweigh the risks. AFTER THE TRANSFUSION Right after receiving a blood transfusion, you will usually feel much better and more energetic. This is especially true if your red blood cells have gotten low (anemic). The transfusion raises the level of the red blood cells which carry oxygen, and this usually causes an energy increase. The nurse administering the transfusion will monitor you carefully for complications. HOME CARE INSTRUCTIONS  No special instructions are needed after a transfusion. You may find your energy is better. Speak with your caregiver about any  limitations on activity for underlying diseases you may have. SEEK MEDICAL CARE IF:  Your condition is not improving after your transfusion. You develop redness or irritation at the intravenous (IV) site. SEEK IMMEDIATE MEDICAL CARE IF:  Any of the following symptoms occur over the next 12 hours: Shaking chills. You have  a temperature by mouth above 102 F (38.9 C), not controlled by medicine. Chest, back, or muscle pain. People around you feel you are not acting correctly or are confused. Shortness of breath or difficulty breathing. Dizziness and fainting. You get a rash or develop hives. You have a decrease in urine output. Your urine turns a dark color or changes to pink, red, or brown. Any of the following symptoms occur over the next 10 days: You have a temperature by mouth above 102 F (38.9 C), not controlled by medicine. Shortness of breath. Weakness after normal activity. The white part of the eye turns yellow (jaundice). You have a decrease in the amount of urine or are urinating less often. Your urine turns a dark color or changes to pink, red, or brown. Document Released: 10/10/2000 Document Revised: 01/05/2012 Document Reviewed: 05/29/2008 [facility] Patient Information 2014 [place], [place].  _______________________________________________________________________

## 2021-10-18 ENCOUNTER — Telehealth: Payer: Self-pay

## 2021-10-18 NOTE — Telephone Encounter (Signed)
Attempted to reach pt to check in and see how she is feeling. No answer. Left VM stating for pt to return call to our office.

## 2021-10-22 ENCOUNTER — Encounter: Payer: Self-pay | Admitting: Hematology and Oncology

## 2021-10-23 ENCOUNTER — Other Ambulatory Visit: Payer: Self-pay | Admitting: Gynecologic Oncology

## 2021-10-23 ENCOUNTER — Telehealth: Payer: Self-pay

## 2021-10-23 ENCOUNTER — Encounter: Payer: Self-pay | Admitting: Hematology and Oncology

## 2021-10-23 ENCOUNTER — Encounter: Payer: Self-pay | Admitting: Gynecologic Oncology

## 2021-10-23 ENCOUNTER — Other Ambulatory Visit (HOSPITAL_COMMUNITY): Payer: Self-pay

## 2021-10-23 DIAGNOSIS — C539 Malignant neoplasm of cervix uteri, unspecified: Secondary | ICD-10-CM

## 2021-10-23 MED ORDER — SENNOSIDES-DOCUSATE SODIUM 8.6-50 MG PO TABS
2.0000 | ORAL_TABLET | Freq: Every day | ORAL | 0 refills | Status: DC
  Filled 2021-10-23: qty 30, 15d supply, fill #0

## 2021-10-23 MED ORDER — OXYCODONE HCL 5 MG PO TABS
5.0000 mg | ORAL_TABLET | ORAL | 0 refills | Status: DC | PRN
  Filled 2021-10-23: qty 15, 3d supply, fill #0

## 2021-10-23 MED ORDER — IBUPROFEN 800 MG PO TABS
800.0000 mg | ORAL_TABLET | Freq: Three times a day (TID) | ORAL | 1 refills | Status: DC | PRN
  Filled 2021-10-23: qty 30, 10d supply, fill #0
  Filled 2022-09-01: qty 30, 10d supply, fill #1

## 2021-10-23 NOTE — Progress Notes (Signed)
Postop meds prescribed preoperatively.

## 2021-10-23 NOTE — Telephone Encounter (Signed)
Attempted to reach Ashley Hammond to review pre-op instructions, arrival time change and to follow up on her covid symptoms. Unable to contact patient. Left message requesting return call.

## 2021-10-23 NOTE — Telephone Encounter (Signed)
Spoke with Ms. Percle regarding prescription for ibuprofen and Prozac. Per Joylene John, NP there is an increased risk of GI bleeding when taking these together. Use Ibuprofen sparingly and take with food. Patient questioning if she should stop taking Prozac. Instructed patient not to stop taking Prozac as this can cause withdrawal symptoms. Instructed to take tylenol and oxycodone and use ibuprofen sparingly post-operatively. Patient verbalized understanding and states she may call her PCP to see if she needs to change her prozac.  Instructed to call with any questions or concerns.

## 2021-10-23 NOTE — Telephone Encounter (Signed)
Patient is having a hysterectomy tomorrow. She was told that she needed to call and ask you if she could take her prozac since she is to be taking ibuprofen for pain. The surgeon states that the interaction may cause more bleeding for the patient and she was told to call and ask you what to do. Please advise.

## 2021-10-23 NOTE — Telephone Encounter (Signed)
Spoke with Ms. Norgard this afternoon. Patient reports doing well since her positive covid test on 10/10/21. She states she had a mild case and mainly had congestion. She feels ready to have her surgery tomorrow. Advised patient that prescriptions for after surgery have been sent to [place] for her to pick up. Joylene John, NP has sent a mychart message with pre-operative instructions. Reviewed instructions with patient and advised her to call the clinic with any questions.  Reinforced nothing to eat after midnight. Clear liquids until 0800. Patient to arrive at 0830. No questions or concerns voiced.  Instructed to call for any needs.

## 2021-10-24 ENCOUNTER — Ambulatory Visit (HOSPITAL_COMMUNITY): Payer: BC Managed Care – PPO | Admitting: Physician Assistant

## 2021-10-24 ENCOUNTER — Ambulatory Visit (HOSPITAL_COMMUNITY): Payer: BC Managed Care – PPO | Admitting: Anesthesiology

## 2021-10-24 ENCOUNTER — Encounter (HOSPITAL_COMMUNITY): Payer: Self-pay | Admitting: Gynecologic Oncology

## 2021-10-24 ENCOUNTER — Other Ambulatory Visit: Payer: Self-pay

## 2021-10-24 ENCOUNTER — Encounter: Payer: Self-pay | Admitting: Hematology and Oncology

## 2021-10-24 ENCOUNTER — Ambulatory Visit (HOSPITAL_COMMUNITY)
Admission: RE | Admit: 2021-10-24 | Discharge: 2021-10-24 | Disposition: A | Discharge status: Home / Self Care | Payer: BC Managed Care – PPO | Source: Ambulatory Visit | Attending: Gynecologic Oncology | Admitting: Gynecologic Oncology

## 2021-10-24 ENCOUNTER — Encounter (HOSPITAL_COMMUNITY)
Admission: RE | Disposition: A | Discharge status: Home / Self Care | Payer: Self-pay | Source: Ambulatory Visit | Attending: Gynecologic Oncology

## 2021-10-24 DIAGNOSIS — R569 Unspecified convulsions: Secondary | ICD-10-CM | POA: Insufficient documentation

## 2021-10-24 DIAGNOSIS — N8000 Endometriosis of the uterus, unspecified: Secondary | ICD-10-CM | POA: Insufficient documentation

## 2021-10-24 DIAGNOSIS — E039 Hypothyroidism, unspecified: Secondary | ICD-10-CM | POA: Diagnosis not present

## 2021-10-24 DIAGNOSIS — M1990 Unspecified osteoarthritis, unspecified site: Secondary | ICD-10-CM | POA: Diagnosis not present

## 2021-10-24 DIAGNOSIS — C539 Malignant neoplasm of cervix uteri, unspecified: Secondary | ICD-10-CM | POA: Diagnosis not present

## 2021-10-24 DIAGNOSIS — F419 Anxiety disorder, unspecified: Secondary | ICD-10-CM | POA: Insufficient documentation

## 2021-10-24 DIAGNOSIS — F32A Depression, unspecified: Secondary | ICD-10-CM | POA: Diagnosis not present

## 2021-10-24 DIAGNOSIS — Z923 Personal history of irradiation: Secondary | ICD-10-CM | POA: Diagnosis not present

## 2021-10-24 DIAGNOSIS — Z87891 Personal history of nicotine dependence: Secondary | ICD-10-CM | POA: Insufficient documentation

## 2021-10-24 DIAGNOSIS — D649 Anemia, unspecified: Secondary | ICD-10-CM | POA: Diagnosis not present

## 2021-10-24 HISTORY — PX: ROBOTIC ASSISTED TOTAL HYSTERECTOMY WITH BILATERAL SALPINGO OOPHERECTOMY: SHX6086

## 2021-10-24 LAB — BASIC METABOLIC PANEL
Anion gap: 10 (ref 5–15)
BUN: 10 mg/dL (ref 6–20)
CO2: 25 mmol/L (ref 22–32)
Calcium: 9.2 mg/dL (ref 8.9–10.3)
Chloride: 102 mmol/L (ref 98–111)
Creatinine, Ser: 0.76 mg/dL (ref 0.44–1.00)
GFR, Estimated: >60 mL/min (ref >60)
Glucose, Bld: 86 mg/dL (ref 70–99)
Potassium: 3.8 mmol/L (ref 3.5–5.1)
Sodium: 137 mmol/L (ref 135–145)

## 2021-10-24 LAB — TYPE AND SCREEN
ABO/RH(D): O POS
Antibody Screen: NEGATIVE

## 2021-10-24 LAB — CBC
HCT: 35.4 % — ABNORMAL LOW (ref 36.0–46.0)
Hemoglobin: 11.9 g/dL — ABNORMAL LOW (ref 12.0–15.0)
MCH: 34.6 pg — ABNORMAL HIGH (ref 26.0–34.0)
MCHC: 33.6 g/dL (ref 30.0–36.0)
MCV: 102.9 fL — ABNORMAL HIGH (ref 80.0–100.0)
Platelets: 177 10*3/uL (ref 150–400)
RBC: 3.44 MIL/uL — ABNORMAL LOW (ref 3.87–5.11)
RDW: 15.5 % (ref 11.5–15.5)
WBC: 3.8 10*3/uL — ABNORMAL LOW (ref 4.0–10.5)
nRBC: 0 % (ref 0.0–0.2)

## 2021-10-24 SURGERY — HYSTERECTOMY, TOTAL, ROBOT-ASSISTED, LAPAROSCOPIC, WITH BILATERAL SALPINGO-OOPHORECTOMY
Anesthesia: General | Laterality: Bilateral

## 2021-10-24 MED ORDER — LIDOCAINE HCL (PF) 2 % IJ SOLN
INTRAMUSCULAR | Status: AC
  Filled 2021-10-24: qty 25

## 2021-10-24 MED ORDER — OXYCODONE HCL 5 MG PO TABS: 5.0000 mg | ORAL_TABLET | Freq: Once | ORAL | Status: DC | PRN

## 2021-10-24 MED ORDER — MIDAZOLAM HCL 5 MG/5ML IJ SOLN
INTRAMUSCULAR | Status: DC | PRN
  Administered 2021-10-24: 2 mg via INTRAVENOUS

## 2021-10-24 MED ORDER — CEFAZOLIN SODIUM-DEXTROSE 2-4 GM/100ML-% IV SOLN
2.0000 g | INTRAVENOUS | Status: AC
  Administered 2021-10-24: 11:00:00 2 g via INTRAVENOUS
  Filled 2021-10-24: qty 100

## 2021-10-24 MED ORDER — FENTANYL CITRATE (PF) 100 MCG/2ML IJ SOLN
INTRAMUSCULAR | Status: DC | PRN
  Administered 2021-10-24: 50 ug via INTRAVENOUS
  Administered 2021-10-24 (×2): 25 ug via INTRAVENOUS

## 2021-10-24 MED ORDER — BUPIVACAINE HCL 0.25 % IJ SOLN
INTRAMUSCULAR | Status: DC | PRN
  Administered 2021-10-24: 30 mL

## 2021-10-24 MED ORDER — ROCURONIUM BROMIDE 10 MG/ML (PF) SYRINGE
PREFILLED_SYRINGE | INTRAVENOUS | Status: AC
  Filled 2021-10-24: qty 20

## 2021-10-24 MED ORDER — OXYCODONE HCL 5 MG/5ML PO SOLN: 5.0000 mg | Freq: Once | ORAL | Status: DC | PRN

## 2021-10-24 MED ORDER — ACETAMINOPHEN 500 MG PO TABS: 1000.0000 mg | ORAL_TABLET | Freq: Once | ORAL | Status: DC | PRN

## 2021-10-24 MED ORDER — ORAL CARE MOUTH RINSE: 15.0000 mL | Freq: Once | OROMUCOSAL | Status: AC

## 2021-10-24 MED ORDER — PHENYLEPHRINE 40 MCG/ML (10ML) SYRINGE FOR IV PUSH (FOR BLOOD PRESSURE SUPPORT)
PREFILLED_SYRINGE | INTRAVENOUS | Status: AC
  Filled 2021-10-24: qty 10

## 2021-10-24 MED ORDER — LACTATED RINGERS IV SOLN: INTRAVENOUS | Status: DC

## 2021-10-24 MED ORDER — BUPIVACAINE HCL 0.25 % IJ SOLN
INTRAMUSCULAR | Status: AC
  Filled 2021-10-24: qty 1

## 2021-10-24 MED ORDER — ACETAMINOPHEN 160 MG/5ML PO SOLN: 1000.0000 mg | Freq: Once | ORAL | Status: DC | PRN

## 2021-10-24 MED ORDER — DEXAMETHASONE SODIUM PHOSPHATE 4 MG/ML IJ SOLN: 4.0000 mg | INTRAMUSCULAR | Status: DC

## 2021-10-24 MED ORDER — FENTANYL CITRATE PF 50 MCG/ML IJ SOSY
25.0000 ug | PREFILLED_SYRINGE | INTRAMUSCULAR | Status: DC | PRN
  Administered 2021-10-24 (×2): 25 ug via INTRAVENOUS

## 2021-10-24 MED ORDER — HEPARIN SODIUM (PORCINE) 5000 UNIT/ML IJ SOLN
5000.0000 [IU] | INTRAMUSCULAR | Status: AC
  Administered 2021-10-24: 10:00:00 5000 [IU] via SUBCUTANEOUS
  Filled 2021-10-24: qty 1

## 2021-10-24 MED ORDER — ENSURE PRE-SURGERY PO LIQD
296.0000 mL | Freq: Once | ORAL | Status: DC
  Filled 2021-10-24: qty 296

## 2021-10-24 MED ORDER — FENTANYL CITRATE (PF) 100 MCG/2ML IJ SOLN
INTRAMUSCULAR | Status: AC
  Filled 2021-10-24: qty 2

## 2021-10-24 MED ORDER — LIDOCAINE 2% (20 MG/ML) 5 ML SYRINGE
INTRAMUSCULAR | Status: DC | PRN
  Administered 2021-10-24: 60 mg via INTRAVENOUS

## 2021-10-24 MED ORDER — PROPOFOL 10 MG/ML IV BOLUS
INTRAVENOUS | Status: DC | PRN
  Administered 2021-10-24: 20 mg via INTRAVENOUS
  Administered 2021-10-24: 140 mg via INTRAVENOUS

## 2021-10-24 MED ORDER — ACETAMINOPHEN 500 MG PO TABS
1000.0000 mg | ORAL_TABLET | ORAL | Status: AC
  Administered 2021-10-24: 10:00:00 1000 mg via ORAL
  Filled 2021-10-24: qty 2

## 2021-10-24 MED ORDER — CELECOXIB 200 MG PO CAPS
200.0000 mg | ORAL_CAPSULE | ORAL | Status: AC
  Administered 2021-10-24: 10:00:00 200 mg via ORAL
  Filled 2021-10-24: qty 1

## 2021-10-24 MED ORDER — LIDOCAINE HCL (PF) 2 % IJ SOLN
INTRAMUSCULAR | Status: DC | PRN
  Administered 2021-10-24: 1.5 mg/kg/h via INTRADERMAL

## 2021-10-24 MED ORDER — CHLORHEXIDINE GLUCONATE 0.12 % MT SOLN
15.0000 mL | Freq: Once | OROMUCOSAL | Status: AC
  Administered 2021-10-24: 10:00:00 15 mL via OROMUCOSAL

## 2021-10-24 MED ORDER — FENTANYL CITRATE PF 50 MCG/ML IJ SOSY
PREFILLED_SYRINGE | INTRAMUSCULAR | Status: AC
  Filled 2021-10-24: qty 1

## 2021-10-24 MED ORDER — SCOPOLAMINE 1 MG/3DAYS TD PT72
1.0000 | MEDICATED_PATCH | TRANSDERMAL | Status: DC
  Administered 2021-10-24: 10:00:00 1.5 mg via TRANSDERMAL
  Filled 2021-10-24: qty 1

## 2021-10-24 MED ORDER — STERILE WATER FOR IRRIGATION IR SOLN
Status: DC | PRN
  Administered 2021-10-24: 1000 mL

## 2021-10-24 MED ORDER — ARTIFICIAL TEARS OPHTHALMIC OINT
TOPICAL_OINTMENT | OPHTHALMIC | Status: DC | PRN
  Administered 2021-10-24: 1 via OPHTHALMIC

## 2021-10-24 MED ORDER — ONDANSETRON HCL 4 MG/2ML IJ SOLN
INTRAMUSCULAR | Status: DC | PRN
  Administered 2021-10-24: 4 mg via INTRAVENOUS

## 2021-10-24 MED ORDER — GABAPENTIN 300 MG PO CAPS
300.0000 mg | ORAL_CAPSULE | ORAL | Status: AC
  Administered 2021-10-24: 10:00:00 300 mg via ORAL
  Filled 2021-10-24: qty 1

## 2021-10-24 MED ORDER — MIDAZOLAM HCL 2 MG/2ML IJ SOLN
INTRAMUSCULAR | Status: AC
  Filled 2021-10-24: qty 2

## 2021-10-24 MED ORDER — LACTATED RINGERS IR SOLN
Status: DC | PRN
  Administered 2021-10-24: 1

## 2021-10-24 MED ORDER — ROCURONIUM BROMIDE 10 MG/ML (PF) SYRINGE
PREFILLED_SYRINGE | INTRAVENOUS | Status: DC | PRN
  Administered 2021-10-24: 20 mg via INTRAVENOUS
  Administered 2021-10-24: 70 mg via INTRAVENOUS

## 2021-10-24 MED ORDER — DEXAMETHASONE SODIUM PHOSPHATE 10 MG/ML IJ SOLN
INTRAMUSCULAR | Status: DC | PRN
  Administered 2021-10-24: 6 mg via INTRAVENOUS

## 2021-10-24 MED ORDER — ONDANSETRON HCL 4 MG/2ML IJ SOLN
INTRAMUSCULAR | Status: AC
  Filled 2021-10-24: qty 2

## 2021-10-24 MED ORDER — PHENYLEPHRINE HCL-NACL 20-0.9 MG/250ML-% IV SOLN
INTRAVENOUS | Status: DC | PRN
  Administered 2021-10-24: 40 ug/min via INTRAVENOUS

## 2021-10-24 MED ORDER — ACETAMINOPHEN 10 MG/ML IV SOLN: 1000.0000 mg | Freq: Once | INTRAVENOUS | Status: DC | PRN

## 2021-10-24 MED ORDER — PROPOFOL 10 MG/ML IV BOLUS
INTRAVENOUS | Status: AC
  Filled 2021-10-24: qty 20

## 2021-10-24 SURGICAL SUPPLY — 77 items
ADH SKN CLS APL DERMABOND .7 (GAUZE/BANDAGES/DRESSINGS) ×1
AGENT HMST KT MTR STRL THRMB (HEMOSTASIS)
APL ESCP 34 STRL LF DISP (HEMOSTASIS)
APPLICATOR SURGIFLO ENDO (HEMOSTASIS) IMPLANT
BAG COUNTER SPONGE SURGICOUNT (BAG) IMPLANT
BAG LAPAROSCOPIC 12 15 PORT 16 (BASKET) IMPLANT
BAG RETRIEVAL 12/15 (BASKET)
BAG RETRIEVAL 12/15MM (BASKET)
BAG SPEC RTRVL LRG 6X4 10 (ENDOMECHANICALS)
BAG SPNG CNTER NS LX DISP (BAG)
BAG SURGICOUNT SPONGE COUNTING (BAG)
BLADE SURG SZ10 CARB STEEL (BLADE) IMPLANT
COVER BACK TABLE 60X90IN (DRAPES) ×4 IMPLANT
COVER TIP SHEARS 8 DVNC (MISCELLANEOUS) ×2 IMPLANT
COVER TIP SHEARS 8MM DA VINCI (MISCELLANEOUS) ×3
DECANTER SPIKE VIAL GLASS SM (MISCELLANEOUS) ×4 IMPLANT
DERMABOND ADVANCED (GAUZE/BANDAGES/DRESSINGS) ×2
DERMABOND ADVANCED .7 DNX12 (GAUZE/BANDAGES/DRESSINGS) ×2 IMPLANT
DRAPE ARM DVNC X/XI (DISPOSABLE) ×8 IMPLANT
DRAPE COLUMN DVNC XI (DISPOSABLE) ×2 IMPLANT
DRAPE DA VINCI XI ARM (DISPOSABLE) ×12
DRAPE DA VINCI XI COLUMN (DISPOSABLE) ×3
DRAPE SHEET LG 3/4 BI-LAMINATE (DRAPES) ×4 IMPLANT
DRAPE SURG IRRIG POUCH 19X23 (DRAPES) ×4 IMPLANT
DRSG OPSITE POSTOP 4X6 (GAUZE/BANDAGES/DRESSINGS) IMPLANT
DRSG OPSITE POSTOP 4X8 (GAUZE/BANDAGES/DRESSINGS) IMPLANT
ELECT PENCIL ROCKER SW 15FT (MISCELLANEOUS) IMPLANT
ELECT REM PT RETURN 15FT ADLT (MISCELLANEOUS) ×4 IMPLANT
GAUZE 4X4 16PLY ~~LOC~~+RFID DBL (SPONGE) ×4 IMPLANT
GLOVE SURG ENC MOIS LTX SZ6 (GLOVE) ×16 IMPLANT
GLOVE SURG ENC MOIS LTX SZ6.5 (GLOVE) ×8 IMPLANT
GOWN STRL REUS W/ TWL LRG LVL3 (GOWN DISPOSABLE) ×8 IMPLANT
GOWN STRL REUS W/TWL LRG LVL3 (GOWN DISPOSABLE) ×12
HOLDER FOLEY CATH W/STRAP (MISCELLANEOUS) IMPLANT
IRRIG SUCT STRYKERFLOW 2 WTIP (MISCELLANEOUS) ×3
IRRIGATION SUCT STRKRFLW 2 WTP (MISCELLANEOUS) ×2 IMPLANT
KIT PROCEDURE DA VINCI SI (MISCELLANEOUS)
KIT PROCEDURE DVNC SI (MISCELLANEOUS) IMPLANT
KIT TURNOVER KIT A (KITS) IMPLANT
MANIPULATOR ADVINCU DEL 3.0 PL (MISCELLANEOUS) IMPLANT
MANIPULATOR ADVINCU DEL 3.5 PL (MISCELLANEOUS) IMPLANT
MANIPULATOR UTERINE 4.5 ZUMI (MISCELLANEOUS) IMPLANT
NDL HYPO 21X1.5 SAFETY (NEEDLE) ×2 IMPLANT
NDL SPNL 18GX3.5 QUINCKE PK (NEEDLE) IMPLANT
NEEDLE HYPO 21X1.5 SAFETY (NEEDLE) ×3 IMPLANT
NEEDLE SPNL 18GX3.5 QUINCKE PK (NEEDLE) IMPLANT
OBTURATOR OPTICAL STANDARD 8MM (TROCAR) ×3
OBTURATOR OPTICAL STND 8 DVNC (TROCAR) ×1
OBTURATOR OPTICALSTD 8 DVNC (TROCAR) ×2 IMPLANT
PACK ROBOT GYN CUSTOM WL (TRAY / TRAY PROCEDURE) ×4 IMPLANT
PAD POSITIONING PINK XL (MISCELLANEOUS) ×4 IMPLANT
PORT ACCESS TROCAR AIRSEAL 12 (TROCAR) ×2 IMPLANT
PORT ACCESS TROCAR AIRSEAL 5M (TROCAR) ×2
POUCH SPECIMEN RETRIEVAL 10MM (ENDOMECHANICALS) IMPLANT
SCRUB EXIDINE 4% CHG 4OZ (MISCELLANEOUS) ×4 IMPLANT
SEAL CANN UNIV 5-8 DVNC XI (MISCELLANEOUS) ×8 IMPLANT
SEAL XI 5MM-8MM UNIVERSAL (MISCELLANEOUS) ×12
SET TRI-LUMEN FLTR TB AIRSEAL (TUBING) ×4 IMPLANT
SPONGE T-LAP 18X18 ~~LOC~~+RFID (SPONGE) IMPLANT
SURGIFLO W/THROMBIN 8M KIT (HEMOSTASIS) IMPLANT
SUT MNCRL AB 4-0 PS2 18 (SUTURE) IMPLANT
SUT PDS AB 1 TP1 96 (SUTURE) IMPLANT
SUT VIC AB 0 CT1 27 (SUTURE)
SUT VIC AB 0 CT1 27XBRD ANTBC (SUTURE) IMPLANT
SUT VIC AB 2-0 CT1 27 (SUTURE)
SUT VIC AB 2-0 CT1 TAPERPNT 27 (SUTURE) IMPLANT
SUT VIC AB 2-0 SH 27 (SUTURE) ×3
SUT VIC AB 2-0 SH 27X BRD (SUTURE) IMPLANT
SUT VIC AB 4-0 PS2 18 (SUTURE) ×8 IMPLANT
SYR 10ML LL (SYRINGE) IMPLANT
TOWEL OR NON WOVEN STRL DISP B (DISPOSABLE) IMPLANT
TRAP SPECIMEN MUCUS 40CC (MISCELLANEOUS) IMPLANT
TRAY FOLEY MTR SLVR 16FR STAT (SET/KITS/TRAYS/PACK) ×4 IMPLANT
TROCAR XCEL NON-BLD 5MMX100MML (ENDOMECHANICALS) IMPLANT
UNDERPAD 30X36 HEAVY ABSORB (UNDERPADS AND DIAPERS) ×8 IMPLANT
WATER STERILE IRR 1000ML POUR (IV SOLUTION) ×4 IMPLANT
YANKAUER SUCT BULB TIP 10FT TU (MISCELLANEOUS) IMPLANT

## 2021-10-24 NOTE — Anesthesia Preprocedure Evaluation (Addendum)
Anesthesia Evaluation  Patient identified by MRN, date of birth, ID band Patient awake    Reviewed: Allergy & Precautions, NPO status , Patient's Chart, lab work & pertinent test results  History of Anesthesia Complications Negative for: history of anesthetic complications  Airway Mallampati: IV  TM Distance: <3 FB Neck ROM: Full    Dental  (+) Teeth Intact, Dental Advisory Given   Pulmonary former smoker,    breath sounds clear to auscultation       Cardiovascular negative cardio ROS   Rhythm:Regular     Neuro/Psych Seizures -,  PSYCHIATRIC DISORDERS Anxiety Depression Last 2 months ago    GI/Hepatic negative GI ROS,   Endo/Other  Hypothyroidism   Renal/GU negative Renal ROS     Musculoskeletal  (+) Arthritis ,   Abdominal   Peds  Hematology  (+) anemia , Lab Results      Component                Value               Date                      WBC                      3.0 (L)             08/28/2021                HGB                      11.8 (L)            08/28/2021                HCT                      33.2 (L)            08/28/2021                MCV                      94.1                08/28/2021                PLT                      107 (L)             08/28/2021              Anesthesia Other Findings   Reproductive/Obstetrics                            Anesthesia Physical Anesthesia Plan  ASA: 2  Anesthesia Plan: General   Post-op Pain Management: Ofirmev IV (intra-op), Toradol IV (intra-op) and Lidocaine infusion   Induction: Intravenous  PONV Risk Score and Plan: Ondansetron and Dexamethasone  Airway Management Planned: Oral ETT  Additional Equipment: None  Intra-op Plan:   Post-operative Plan: Extubation in OR  Informed Consent: I have reviewed the patients History and Physical, chart, labs and discussed the procedure including the risks, benefits and  alternatives for the proposed anesthesia with the patient or authorized representative who has indicated his/her understanding and acceptance.  Dental advisory given  Plan Discussed with: CRNA and Anesthesiologist  Anesthesia Plan Comments:        Anesthesia Quick Evaluation

## 2021-10-24 NOTE — Transfer of Care (Signed)
Immediate Anesthesia Transfer of Care Note  Patient: Ashley Hammond  Procedure(s) Performed: XI ROBOTIC ASSISTED TOTAL HYSTERECTOMY WITH BILATERAL SALPINGO OOPHORECTOMY (Bilateral)  Patient Location: PACU  Anesthesia Type:General  Level of Consciousness: awake  Airway & Oxygen Therapy: Patient Spontanous Breathing and Patient connected to face mask oxygen  Post-op Assessment: Report given to RN and Post -op Vital signs reviewed and stable  Post vital signs: Reviewed and stable  Last Vitals:  Vitals Value Taken Time  BP 135/96 10/24/21 1334  Temp    Pulse 85 10/24/21 1338  Resp 16 10/24/21 1339  SpO2 99 % 10/24/21 1338  Vitals shown include unvalidated device data.  Last Pain:  Vitals:   10/24/21 0931  TempSrc:   PainSc: 0-No pain         Complications: No notable events documented.

## 2021-10-24 NOTE — Discharge Instructions (Signed)

## 2021-10-24 NOTE — Anesthesia Procedure Notes (Signed)
Procedure Name: Intubation Date/Time: 10/24/2021 11:22 AM Performed by: Milford Cage, CRNA Pre-anesthesia Checklist: Patient identified, Emergency Drugs available, Suction available and Patient being monitored Patient Re-evaluated:Patient Re-evaluated prior to induction Oxygen Delivery Method: Circle system utilized Preoxygenation: Pre-oxygenation with 100% oxygen Induction Type: IV induction Ventilation: Mask ventilation without difficulty Laryngoscope Size: Miller and 2 Grade View: Grade III Tube type: Oral Tube size: 7.5 mm Number of attempts: 1 Airway Equipment and Method: Bougie stylet Placement Confirmation: ETT inserted through vocal cords under direct vision, positive ETCO2 and breath sounds checked- equal and bilateral Secured at: 23 cm Tube secured with: Tape Dental Injury: Teeth and Oropharynx as per pre-operative assessment  Difficulty Due To: Difficulty was anticipated and Difficult Airway- due to anterior larynx Future Recommendations: Recommend- induction with short-acting agent, and alternative techniques readily available Comments: Grade 3 view with Mil2, bougie stylet used. Recommend glidescope in future

## 2021-10-24 NOTE — Op Note (Signed)
OPERATIVE NOTE  Pre-operative Diagnosis: Stage IB3 SCC of the cervix, s/p EBRT and one HDR treatment  Post-operative Diagnosis: same as above  Operation: Robotic-assisted laparoscopic total hysterectomy with bilateral salpingoophorectomy Surgeon: Jeral Pinch MD  Assistant Surgeon: Lahoma Crocker MD (an MD assistant was necessary for tissue manipulation, management of robotic instrumentation, retraction and positioning due to the complexity of the case and hospital policies).   Anesthesia: GET  Urine Output: 250cc  Operative Findings: On EUA, small mobile uterus. What appears to be residual tumor (approximately 1cm) replaces the cervical os. On intra-abdominal entry, normal upper abdominal survey. Normal omentum, small and large bowel. Uterus 6cm, normal appearing adnexa. Mild retroperitoneal edema and fibrosis given recent RT. Moderate adhesions between bladder and cervix. Good visual margin taken around the cervix while trying to preserve as much vaginal length as possible. No intra-abdominal or pelvic evidence of disease.  Estimated Blood Loss:  less than 100 mL      Total IV Fluids: see I&O flowsheet         Specimens: uterus, cervix, bilateral tubes and ovaries         Complications:  None apparent; patient tolerated the procedure well.         Disposition: PACU - hemodynamically stable.  Procedure Details  The patient was seen in the Holding Room. The risks, benefits, complications, treatment options, and expected outcomes were discussed with the patient.  The patient concurred with the proposed plan, giving informed consent.  The site of surgery properly noted/marked. The patient was identified as Ashley Hammond and the procedure verified as a Robotic-assisted hysterectomy with bilateral salpingo oophorectomy.   After induction of anesthesia, the patient was draped and prepped in the usual sterile manner. Patient was placed in supine position after anesthesia and  draped and prepped in the usual sterile manner as follows: Her arms were tucked to her side with all appropriate precautions.  The shoulders were stabilized with padded shoulder blocks applied to the acromium processes.  The patient was placed in the semi-lithotomy position in [place].  The perineum and vagina were prepped with CholoraPrep. The patient was draped after the CholoraPrep had been allowed to dry for 3 minutes.  A Time Out was held and the above information confirmed.  The urethra was prepped with Betadine. Foley catheter was placed.  A sterile speculum was placed in the vagina. Findings noted above. EEA sizer used in the vagina. OG tube placement was confirmed and to suction.   Next, a 10 mm skin incision was made 1 cm below the subcostal margin in the midclavicular line.  The 5 mm Optiview port and scope was used for direct entry.  Opening pressure was under 10 mm CO2.  The abdomen was insufflated and the findings were noted as above.   At this point and all points during the procedure, the patient's intra-abdominal pressure did not exceed 15 mmHg. Next, an 8 mm skin incision was made superior to the umbilicus and a right and left port were placed about 8 cm lateral to the robot port on the right and left side.  A fourth arm was placed on the right.  The 5 mm assist trocar was exchanged for a 10-12 mm port. All ports were placed under direct visualization.  The patient was placed in steep Trendelenburg.  Bowel was already noted in the upper abdomen. The robot was docked in the normal manner.  The right and left peritoneum were opened parallel to the IP ligament  to open the retroperitoneal spaces bilaterally. The round ligaments were transected. The ureter was noted to be on the medial leaf of the broad ligament.  The peritoneum above the ureter was incised and stretched and the infundibulopelvic ligament was skeletonized, cauterized and cut.    The posterior peritoneum was taken down to  the level of the EEA sizer.  The anterior peritoneum was also taken down.  The bladder flap was created to the level of the EEA sizer.  The uterine artery on the right side was skeletonized, cauterized and cut in the normal manner.  A similar procedure was performed on the left.  The colpotomy was made and the uterus, cervix, bilateral ovaries and tubes were amputated and delivered through the vagina.  Pedicles were inspected and excellent hemostasis was achieved.    The colpotomy at the vaginal cuff was closed with Vicryl on a CT1 needle in running manner. Several figure of eight stitches were placed with 0 vicryl suture to reinforce the closure. Small bleeding vessel on the bladder was made hemostatic with a figure of eight stitch using 2-0 Vicryl. Irrigation was used and excellent hemostasis was achieved.  At this point in the procedure was completed.  Robotic instruments were removed under direct visulaization.  The robot was undocked. The fascia at the 10-12 mm port was closed with 0 Vicryl on a UR-5 needle.  The subcuticular tissue was closed with 4-0 Vicryl and the skin was closed with 4-0 Monocryl in a subcuticular manner.  Dermabond was applied.    The vagina was swabbed with  minimal bleeding noted.  Foley catheter was removed. All sponge, lap and needle counts were correct x  3.   The patient was transferred to the recovery room in stable condition.  Jeral Pinch, MD

## 2021-10-24 NOTE — Anesthesia Postprocedure Evaluation (Signed)
Anesthesia Post Note  Patient: Ashley Hammond  Procedure(s) Performed: XI ROBOTIC ASSISTED TOTAL HYSTERECTOMY WITH BILATERAL SALPINGO OOPHORECTOMY (Bilateral)     Patient location during evaluation: PACU Anesthesia Type: General Level of consciousness: awake and alert Pain management: pain level controlled Vital Signs Assessment: post-procedure vital signs reviewed and stable Respiratory status: spontaneous breathing, nonlabored ventilation, respiratory function stable and patient connected to nasal cannula oxygen Cardiovascular status: blood pressure returned to baseline and stable Postop Assessment: no apparent nausea or vomiting Anesthetic complications: no   No notable events documented.  Last Vitals:  Vitals:   10/24/21 1415 10/24/21 1433  BP: 113/65 (!) 104/41  Pulse: 70 74  Resp: 12   Temp: 36.7 C   SpO2: 94% 97%    Last Pain:  Vitals:   10/24/21 1433  TempSrc:   PainSc: 3                  [name]

## 2021-10-24 NOTE — H&P (Signed)
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  Due to the abnormal bleeding that she was receiving she was then recommended to follow-up with her gynecologist, Dr. Talbert Nan, who first evaluated the endometrium with an endometrial biopsy performed on 05/20/2021 which revealed benign inactive atrophic endometrium negative for hyperplasia or malignancy.  The bleeding continued and the patient returned for a colposcopic evaluation of the cervix with biopsies (due to the presence of high risk HPV).  This took place on 06/17/2021.  At the time of that colposcopic evaluation there was some abnormal bleeding noted and friability to the cervix.  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   05/20/2021 Pathology Results   FINAL MICROSCOPIC DIAGNOSIS:   A. ENDOMETRIUM, BIOPSY:  - Benign inactive atrophic endometrium  - Negative for hyperplasia or malignancy   05/30/2021 Imaging   US pelvis Small anteverted uterus Thin, symmetrical endometrium Normal ovaries bilaterally No free fluid.   06/17/2021 Pathology Results   FINAL MICROSCOPIC DIAGNOSIS:   A. CERVIX, 6 O'CLOCK, BIOPSY:  -  Invasive squamous cell carcinoma  -  See comment   B. ENDOCERVIX, CURETTAGE:  -  Squamous cell carcinoma  -  See comment   COMMENT:   A.  The carcinoma is positive for p16 and p40 supporting the diagnosis of invasive squamous cell carcinoma.     07/11/2021 PET scan   Intense hypermetabolic activity associated with  ill-defined nodular enlargement of the cervix/lower uterine segment, consistent with patient's biopsy proven cervical neoplasm.   No definite scintigraphic evidence of metastatic disease in the neck, chest, abdomen or pelvis.   Right middle lobe 4-5 mm perifissural pulmonary nodule unchanged in size from October 25, 2020 and below the resolution of PET-CT but favored to reflect a peri- fissural lymph node, attention on follow-up studies suggested   Nodular focus of hypermetabolic activity along the right anterior sublingual region, which is nonspecific correlation with direct visualization is suggested.   Hypermetabolic hyperplasia of the tonsils with a hypermetabolic 5 mm left level 1b cervical lymph node, favored reactive.   Hypermetabolic activity associated with callus formation in the right anterior sixth rib likely representing a healing nonpathologic rib fracture.   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 No acute intracranial abnormality. 2. Minimal multifocal hyperintense T2-weight signal within the white matter, nonspecific. This may be seen in the setting of chronic small vessel disease or migraine headaches.     07/25/2021 Imaging   CT head No acute intracranial abnormality.   07/30/2021 Procedure   Successful placement of a power injectable Port-A-Cath via the right internal jugular vein. The catheter is ready for immediate use.     08/02/2021 -  Chemotherapy   Patient is on Treatment Plan : Cervical Cisplatin q7d  Interval History: Doing well, had HDR on 12/1. Presents today for interval hysterectomy.  Past Medical/Surgical History: Past Medical History:  Diagnosis Date   Anxiety    Arthritis     Arthritis of ankle    Right   Cervical cancer ([place])    Depression    Episode of loss of consciousness    Hypothyroidism    Insomnia    Pure hypercholesterolemia    Seizure ([place])     Past Surgical History:  Procedure Laterality Date   ANKLE SURGERY Right    [facility]   IR IMAGING GUIDED PORT INSERTION  07/30/2021   OPERATIVE ULTRASOUND N/A 09/26/2021   Procedure: OPERATIVE ULTRASOUND;  Surgeon: Gery Pray, MD;  Location: [facility];  Service: Urology;  Laterality: N/A;   TANDEM RING INSERTION N/A 09/26/2021   Procedure: TANDEM RING INSERTION;  Surgeon: Gery Pray, MD;  Location: [facility];  Service: Urology;  Laterality: N/A;   WISDOM TOOTH EXTRACTION      Family History  Problem Relation Age of Onset   Heart attack Father    Diabetes Brother    Pancreatic cancer Maternal Grandmother    Ovarian cancer Other    Endometrial cancer Other    Colon cancer Other    Prostate cancer Other    Pancreatic cancer Other    Sudden death Neg Hx    Hyperlipidemia Neg Hx    Hypertension Neg Hx    Breast cancer Neg Hx     Social History   Socioeconomic History   Marital status: Married    Spouse name: Not on file   Number of children: 2   Years of education: Not on file   Highest education level: Not on file  Occupational History   Not on file  Tobacco Use   Smoking status: Former    Packs/day: 0.25    Years: 25.00    Pack years: 6.25    Types: Cigarettes    Quit date: 2016    Years since quitting: 6.9   Smokeless tobacco: Never   Tobacco comments:    20+ yrs  Vaping Use   Vaping Use: Never used  Substance and Sexual Activity   Alcohol use: Yes    Comment: 14 drinks a week of wine   Drug use: Yes    Frequency: 7.0 times per week    Types: Marijuana    Comment: Last use 07-10-21   Sexual activity: Not Currently    Birth control/protection: Post-menopausal  Other Topics Concern   Not on file  Social History Narrative   Not on file   Social Determinants of Health    Financial Resource Strain: Not on file  Food Insecurity: Not on file  Transportation Needs: Not on file  Physical Activity: Not on file  Stress: Not on file  Social Connections: Not on file    Current Medications:  Current Facility-Administered Medications:    ceFAZolin (ANCEF) IVPB 2g/100 mL premix, 2 g, Intravenous, On Call to OR, Cross, Melissa D, NP   dexamethasone (DECADRON) injection 4 mg, 4 mg, Intravenous, On Call to OR, Cross, Melissa D, NP   [START ON 10/25/2021] feeding supplement (ENSURE PRE-SURGERY) liquid 296 mL, 296 mL, Oral, Once, Cross, Melissa D, NP   lactated ringers infusion, , Intravenous, Continuous, Barnet Glasgow, MD, Last Rate: 10 mL/hr at 10/24/21 0949, New Bag at 10/24/21 0949   scopolamine (TRANSDERM-SCOP) 1 MG/3DAYS 1.5 mg, 1 patch, Transdermal, On Call to OR, Cross, Melissa D, NP, 1.5 mg at  10/24/21 0933  Review of Systems: Pertinent positives include anxiety and depression. Denies appetite changes, fevers, chills, fatigue, unexplained weight changes. Denies hearing loss, neck lumps or masses, mouth sores, ringing in ears or voice changes. Denies cough or wheezing.  Denies shortness of breath. Denies chest pain or palpitations. Denies leg swelling. Denies abdominal distention, pain, blood in stools, constipation, diarrhea, nausea, vomiting, or early satiety. Denies pain with intercourse, dysuria, frequency, hematuria or incontinence. Denies hot flashes, pelvic pain, vaginal bleeding or vaginal discharge.   Denies joint pain, back pain or muscle pain/cramps. Denies itching, rash, or wounds. Denies dizziness, headaches, numbness or seizures. Denies swollen lymph nodes or glands, denies easy bruising or bleeding. Denies confusion, or decreased concentration.  Physical Exam: BP 123/73    Pulse 85    Temp 98.7 F (37.1 C) (Oral)    Resp 18    Ht 5\' 9"  (1.753 m)    Wt 160 lb 0.9 oz (72.6 kg)    SpO2 100%    BMI 23.64 kg/m  General: Alert, oriented,  no acute distress. HEENT: Normocephalic, atraumatic, sclera anicteric. Chest: Clear to auscultation bilaterally.  No wheezes or rhonchi. Cardiovascular: Regular rate and rhythm, no murmurs. Abdomen: soft, nontender.  Normoactive bowel sounds.  No masses or hepatosplenomegaly appreciated.   Extremities: Grossly normal range of motion.  Warm, well perfused.  No edema bilaterally.  Laboratory & Radiologic Studies: CBC    Component Value Date/Time   WBC 3.0 (L) 08/28/2021 1441   WBC 6.5 07/30/2021 1238   RBC 3.53 (L) 08/28/2021 1441   HGB 11.8 (L) 08/28/2021 1441   HCT 33.2 (L) 08/28/2021 1441   PLT 107 (L) 08/28/2021 1441   MCV 94.1 08/28/2021 1441   MCH 33.4 08/28/2021 1441   MCHC 35.5 08/28/2021 1441   RDW 12.3 08/28/2021 1441   LYMPHSABS 0.4 (L) 08/28/2021 1441   MONOABS 0.3 08/28/2021 1441   EOSABS 0.2 08/28/2021 1441   BASOSABS 0.0 08/28/2021 1441   BMP Latest Ref Rng & Units 10/24/2021 08/28/2021 08/21/2021  Glucose 70 - 99 mg/dL 86 112(H) 111(H)  BUN 6 - 20 mg/dL 10 9 9   Creatinine 0.44 - 1.00 mg/dL 0.76 0.79 0.78  BUN/Creat Ratio 6 - 22 (calc) - - -  Sodium 135 - 145 mmol/L 137 139 139  Potassium 3.5 - 5.1 mmol/L 3.8 4.1 4.1  Chloride 98 - 111 mmol/L 102 104 104  CO2 22 - 32 mmol/L 25 28 23   Calcium 8.9 - 10.3 mg/dL 9.2 9.4 9.7   Assessment & Plan: Ashley Hammond is a 55 y.o. woman with Stage IB3 SCC of the cervix who presents for post-treatment hysterectomy.  Plan for extrafascial hysterectomy today. See prior counseling.  Jeral Pinch, MD  Division of Gynecologic Oncology  Department of Obstetrics and Gynecology  [facility]

## 2021-10-25 ENCOUNTER — Telehealth: Payer: Self-pay

## 2021-10-25 ENCOUNTER — Encounter (HOSPITAL_COMMUNITY): Payer: Self-pay | Admitting: Gynecologic Oncology

## 2021-10-25 NOTE — Telephone Encounter (Signed)
Spoke with Ms. Dehaas this morning. She states she is drinking and urinating well. She states she has not ate yet today but she will soon. She has not had a BM yet but is passing gas. She is taking senokot as prescribed and encouraged her to drink plenty of water. She denies fever or chills. Incisions are dry and intact. She rates her pain 2/10. She states she has taken (1) oxycodone tablet last night as well as (1) ibuprofen tablet yesterday as well. She has not needed any medication for pain yet today.   Instructed to call office with any fever, chills, purulent drainage, uncontrolled pain or any other questions or concerns. Patient verbalizes understanding.   Pt aware of post op appointments as well as the office number [phone] and after hours number [phone] to call if she has any questions or concerns

## 2021-10-31 ENCOUNTER — Telehealth: Payer: Self-pay | Admitting: *Deleted

## 2021-10-31 ENCOUNTER — Inpatient Hospital Stay: Payer: 59 | Attending: Gynecologic Oncology | Admitting: Gynecologic Oncology

## 2021-10-31 ENCOUNTER — Encounter: Payer: Self-pay | Admitting: Gynecologic Oncology

## 2021-10-31 DIAGNOSIS — Z90710 Acquired absence of both cervix and uterus: Secondary | ICD-10-CM

## 2021-10-31 DIAGNOSIS — Z90722 Acquired absence of ovaries, bilateral: Secondary | ICD-10-CM

## 2021-10-31 DIAGNOSIS — C539 Malignant neoplasm of cervix uteri, unspecified: Secondary | ICD-10-CM

## 2021-10-31 LAB — SURGICAL PATHOLOGY

## 2021-10-31 NOTE — Telephone Encounter (Signed)
Emailed letter to patient for the missed concert

## 2021-10-31 NOTE — Progress Notes (Signed)
Gynecologic Oncology Telehealth Consult Note: Gyn-Onc  I connected with Ashley Hammond on 10/31/21 at  3:45 PM EST by telephone and verified that I am speaking with the correct person using two identifiers.  I discussed the limitations, risks, security and privacy concerns of performing an evaluation and management service by telemedicine and the availability of in-person appointments. I also discussed with the patient that there may be a patient responsible charge related to this service. The patient expressed understanding and agreed to proceed.  Other persons participating in the visit and their role in the encounter: None.  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  Due to the abnormal bleeding that she was receiving she was then recommended to follow-up with her gynecologist, Dr. Talbert Nan, who first evaluated the endometrium with an endometrial biopsy performed on 05/20/2021 which revealed benign inactive atrophic endometrium negative for hyperplasia or malignancy.  The bleeding continued and the patient returned for a colposcopic evaluation of the cervix with biopsies (due to the presence of high risk HPV).  This took place on 06/17/2021.  At the time of that colposcopic evaluation there was some abnormal bleeding noted and friability to the cervix.  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   05/20/2021 Pathology Results   FINAL MICROSCOPIC DIAGNOSIS:   A. ENDOMETRIUM, BIOPSY:  - Benign inactive atrophic endometrium  - Negative for hyperplasia or malignancy   05/30/2021 Imaging   US pelvis Small anteverted uterus Thin, symmetrical endometrium Normal ovaries bilaterally No free fluid.   06/17/2021 Pathology Results   FINAL MICROSCOPIC DIAGNOSIS:   A. CERVIX, 6 O'CLOCK, BIOPSY:  -  Invasive squamous cell carcinoma  -  See comment   B. ENDOCERVIX, CURETTAGE:  -  Squamous cell carcinoma  -  See comment   COMMENT:   A.  The carcinoma is positive for p16 and p40 supporting the diagnosis of invasive squamous cell carcinoma.     07/11/2021 PET scan   Intense hypermetabolic activity associated with ill-defined nodular enlargement of the cervix/lower uterine segment, consistent with patient's biopsy proven cervical neoplasm.   No definite scintigraphic evidence of metastatic disease in the neck, chest, abdomen or pelvis.   Right middle lobe 4-5 mm perifissural pulmonary nodule unchanged in size from October 25, 2020 and below the resolution of PET-CT but favored to reflect a peri- fissural lymph node, attention on follow-up studies suggested   Nodular focus of hypermetabolic activity along the right anterior sublingual region, which is nonspecific correlation with direct visualization is suggested.   Hypermetabolic hyperplasia of the tonsils with a hypermetabolic 5 mm left level 1b cervical lymph node, favored reactive.   Hypermetabolic activity associated with callus formation in the right anterior sixth rib likely representing a healing nonpathologic rib fracture.   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 No acute intracranial abnormality. 2. Minimal multifocal hyperintense T2-weight signal within the white matter, nonspecific. This may be seen in the setting of chronic small vessel disease or migraine headaches.     07/25/2021 Imaging   CT head No acute intracranial abnormality.   07/30/2021 Procedure   Successful placement of a power injectable Port-A-Cath via the right internal jugular vein. The catheter is ready for immediate use.     08/02/2021 -  Chemotherapy   Patient is on Treatment Plan : Cervical Cisplatin q7d     10/24/2021 Surgery   TRH/BSO  Findings: On EUA, small mobile uterus. What appears to be residual tumor (approximately 1cm) replaces the cervical os. On intra-abdominal entry, normal upper abdominal survey. Normal omentum, small and large bowel. Uterus 6cm, normal appearing adnexa. Mild retroperitoneal edema and fibrosis given recent RT. Moderate adhesions between bladder and cervix. Good visual margin taken around the cervix while trying to preserve as much vaginal length as possible. No intra-abdominal or pelvic evidence of disease.   10/24/2021 Pathology Results   A. UTERUS, CERVIX, BILATERAL FALLOPIAN TUBES AND OVARIES:  - Invasive adenosquamous carcinoma of the cervix, see comment  - Carcinoma invades for depth of 0.6 cm  - Resection margins are negative for carcinoma  - Endometriosis of the cervix  - Uterus with benign inactive endometrium  - Benign unremarkable bilateral fallopian tubes and ovaries  - See oncology table      COMMENT:   Most of the tumor shows a squamous morphology with patchy areas of  glandular differentiation. Immunostains show that the tumor cells are  positive for p16, CK5/6 and show evidence of mucin production with  mucicarmine special stain.  Immunostain for p63 is negative.  This  immunoprofile is consistent with above interpretation.     ONCOLOGY TABLE:   UTERINE CERVIX, CARCINOMA: Resection   Procedure:  Total hysterectomy and bilateral salpingo-oophorectomy  Tumor Size: 0.8 cm (as measured on HE glass slide)  Histologic Type: Adenosquamous carcinoma  Histologic Grade: G3: Poorly differentiated  Stromal Invasion: Present       Depth of stromal invasion (mm): 6 mm  Other Tissue/ Organ: Not applicable  Margins: All margins negative for invasive carcinoma  Margin Status for HSIL or AIS: All margins negative for HSIL or AIS  Lymphovascular invasion: Not identified       Regional Lymph Nodes: Not applicable (no lymph nodes submitted or  found)  Distant Metastasis:       Distant sites involved: Not applicable  Pathologic Stage Classification (pTNM, AJCC 8th Edition): pT1b1, pN not  assigned  Ancillary Studies: Can be performed upon request  Representative Tumor Block: A6      Interval History: Patient reports doing very well after surgery.  She has had minimal pain.  She denies any vaginal bleeding or discharge.  She reports regular bowel and bladder function.  She is tolerating diet without nausea or emesis.  Has some minimal bruising around her incisions, no erythema or tenderness.  Past Medical/Surgical History: Past Medical History:  Diagnosis Date   Anxiety    Arthritis    Arthritis of ankle    Right   Cervical cancer ([place])    Depression    Episode of loss of consciousness    Hypothyroidism    Insomnia    Pure hypercholesterolemia    Seizure ([place])  Past Surgical History:  Procedure Laterality Date   ANKLE SURGERY Right    [facility]   IR IMAGING GUIDED PORT INSERTION  07/30/2021   OPERATIVE ULTRASOUND N/A 09/26/2021   Procedure: OPERATIVE ULTRASOUND;  Surgeon: Gery Pray, MD;  Location: [facility];  Service: Urology;  Laterality: N/A;   ROBOTIC ASSISTED TOTAL HYSTERECTOMY WITH BILATERAL SALPINGO OOPHERECTOMY Bilateral 10/24/2021   Procedure: XI ROBOTIC ASSISTED TOTAL HYSTERECTOMY WITH BILATERAL SALPINGO OOPHORECTOMY;  Surgeon: Lafonda Mosses, MD;  Location: [facility];   Service: Gynecology;  Laterality: Bilateral;   TANDEM RING INSERTION N/A 09/26/2021   Procedure: TANDEM RING INSERTION;  Surgeon: Gery Pray, MD;  Location: [facility];  Service: Urology;  Laterality: N/A;   WISDOM TOOTH EXTRACTION      Family History  Problem Relation Age of Onset   Heart attack Father    Diabetes Brother    Pancreatic cancer Maternal Grandmother    Ovarian cancer Other    Endometrial cancer Other    Colon cancer Other    Prostate cancer Other    Pancreatic cancer Other    Sudden death Neg Hx    Hyperlipidemia Neg Hx    Hypertension Neg Hx    Breast cancer Neg Hx     Social History   Socioeconomic History   Marital status: Married    Spouse name: Not on file   Number of children: 2   Years of education: Not on file   Highest education level: Not on file  Occupational History   Not on file  Tobacco Use   Smoking status: Former    Packs/day: 0.25    Years: 25.00    Pack years: 6.25    Types: Cigarettes    Quit date: 2016    Years since quitting: 7.0   Smokeless tobacco: Never   Tobacco comments:    20+ yrs  Vaping Use   Vaping Use: Never used  Substance and Sexual Activity   Alcohol use: Yes    Comment: 14 drinks a week of wine   Drug use: Yes    Frequency: 7.0 times per week    Types: Marijuana    Comment: Last use 07-10-21   Sexual activity: Not Currently    Birth control/protection: Post-menopausal  Other Topics Concern   Not on file  Social History Narrative   Not on file   Social Determinants of Health   Financial Resource Strain: Not on file  Food Insecurity: Not on file  Transportation Needs: Not on file  Physical Activity: Not on file  Stress: Not on file  Social Connections: Not on file    Current Medications:  Current Outpatient Medications:    ALPRAZolam (XANAX) 0.5 MG tablet, TAKE ONE TABLET BY MOUTH  AT BEDTIME AS NEEDED FOR ANXIETY, Disp: 90 tablet, Rfl: 1   Bioflavonoid Products (ESTER-C) TABS, Take 1 tablet by mouth  daily., Disp: , Rfl:    Brimonidine Tartrate (LUMIFY) 0.025 % SOLN, Place 1 drop into both eyes daily as needed (red eyes)., Disp: , Rfl:    diphenoxylate-atropine (LOMOTIL) 2.5-0.025 MG tablet, Take 1 tablet by mouth 4 (four) times daily as needed for diarrhea or loose stools., Disp: 30 tablet, Rfl: 0   FLUoxetine (PROZAC) 10 MG capsule, TAKE ONE CAPSULE BY MOUTH DAILY (Patient taking differently: Take 30 mg by mouth daily.), Disp: 90 capsule, Rfl: 0   guaiFENesin (ROBITUSSIN) 100 MG/5ML liquid, Take 5 mLs by mouth every 4 (four) hours as needed for cough or to loosen phlegm.,  Disp: , Rfl:    ibuprofen (ADVIL) 800 MG tablet, Take 1 tablet by mouth every 8 hours as needed for moderate pain (after surgery only), Disp: 30 tablet, Rfl: 1   lamoTRIgine (LAMICTAL) 25 MG tablet, Take 1 tablet (25 mg total) by mouth 2 (two) times daily., Disp: 180 tablet, Rfl: 3   levETIRAcetam (KEPPRA) 500 MG tablet, Take 1 tablet (500 mg total) by mouth 2 (two) times daily., Disp: 180 tablet, Rfl: 3   levothyroxine (SYNTHROID) 50 MCG tablet, TAKE ONE TABLET BY MOUTH DAILY ON AN EMPTY STOMACH 30 MINUTES BEFORE BREAKFAST, Disp: 90 tablet, Rfl: 0   lidocaine-prilocaine (EMLA) cream, Apply to affected area once (Patient taking differently: Apply 1 application topically daily as needed (port). Apply to affected area once), Disp: 30 g, Rfl: 3   loperamide (IMODIUM) 2 MG capsule, Take 2 mg by mouth as needed for diarrhea or loose stools., Disp: , Rfl:    Magnesium 400 MG CAPS, Take 400 mg by mouth daily., Disp: , Rfl:    ondansetron (ZOFRAN) 8 MG tablet, Take 1 tablet (8 mg total) by mouth every 8 (eight) hours as needed., Disp: 30 tablet, Rfl: 1   oxyCODONE (OXY IR/ROXICODONE) 5 MG immediate release tablet, Take 1 tablet by mouth every 4 hours as needed for severe pain. (for AFTER surgery only) -- do not take and drive, Disp: 15 tablet, Rfl: 0   prochlorperazine (COMPAZINE) 10 MG tablet, Take 1 tablet (10 mg total) by mouth every  6 (six) hours as needed for nausea or vomiting., Disp: 30 tablet, Rfl: 1   rosuvastatin (CRESTOR) 10 MG tablet, Take 10 mg by mouth daily., Disp: , Rfl:    senna-docusate (SENOKOT-S) 8.6-50 MG tablet, Take 2 tablets by mouth at bedtime. For AFTER surgery, do not take if having diarrhea, Disp: 30 tablet, Rfl: 0   zinc gluconate 50 MG tablet, Take 50 mg by mouth daily., Disp: , Rfl:    zolpidem (AMBIEN) 10 MG tablet, TAKE ONE TABLET BY MOUTH EVERY NIGHT AT BEDTIME AS NEEDED, Disp: 30 tablet, Rfl: 2  Review of Symptoms: Complete 10-system review is negative except pertinent positives as per HPI as above in Interval History.  Physical Exam: There were no vitals taken for this visit. Deferred given limitation of phone visit.  Laboratory & Radiologic Studies: A. UTERUS, CERVIX, BILATERAL FALLOPIAN TUBES AND OVARIES:  - Invasive adenosquamous carcinoma of the cervix, see comment  - Carcinoma invades for depth of 0.6 cm  - Resection margins are negative for carcinoma  - Endometriosis of the cervix  - Uterus with benign inactive endometrium  - Benign unremarkable bilateral fallopian tubes and ovaries  - See oncology table   Assessment & Plan: Ashley Hammond is a 56 y.o. 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  Patient is doing very well and meeting all postoperative milestones.  Discussed continued activity restrictions and postoperative expectations.  Reviewed surgery details with her again as well as final pathology.  There is a very small residual tumor, measuring 8 mm, with negative cervical margins.  Patient is quite pleased with these results.  She is aware of her follow-up visit with me in 2 weeks.  Asked her to call if any issues come up before I see her in person.  I discussed the assessment and treatment plan with the patient. The patient was provided with an opportunity to ask questions and  all were answered.  The patient agreed with the plan and demonstrated an understanding of the instructions.   The patient was advised to call back or see an in-person evaluation if the symptoms worsen or if the condition fails to improve as anticipated.   16 minutes of total time was spent for this patient encounter, including preparation, face-to-face counseling with the patient and coordination of care, and documentation of the encounter.   Jeral Pinch, MD  Division of Gynecologic Oncology  Department of Obstetrics and Gynecology  [facility]

## 2021-11-11 ENCOUNTER — Encounter: Payer: Self-pay | Admitting: Hematology and Oncology

## 2021-11-17 ENCOUNTER — Encounter: Payer: Self-pay | Admitting: Hematology and Oncology

## 2021-11-18 ENCOUNTER — Other Ambulatory Visit: Payer: Self-pay | Admitting: Neurology

## 2021-11-18 ENCOUNTER — Other Ambulatory Visit: Payer: Self-pay

## 2021-11-18 ENCOUNTER — Inpatient Hospital Stay (HOSPITAL_BASED_OUTPATIENT_CLINIC_OR_DEPARTMENT_OTHER): Payer: 59 | Admitting: Gynecologic Oncology

## 2021-11-18 ENCOUNTER — Other Ambulatory Visit (HOSPITAL_COMMUNITY): Payer: Self-pay

## 2021-11-18 ENCOUNTER — Encounter: Payer: Self-pay | Admitting: Hematology and Oncology

## 2021-11-18 ENCOUNTER — Encounter: Payer: Self-pay | Admitting: Oncology

## 2021-11-18 ENCOUNTER — Encounter: Payer: Self-pay | Admitting: Gynecologic Oncology

## 2021-11-18 ENCOUNTER — Other Ambulatory Visit: Payer: Self-pay | Admitting: Internal Medicine

## 2021-11-18 VITALS — BP 108/77 | HR 77 | Temp 98.1°F | Resp 16 | Ht 69.0 in | Wt 166.3 lb

## 2021-11-18 DIAGNOSIS — Z90722 Acquired absence of ovaries, bilateral: Secondary | ICD-10-CM

## 2021-11-18 DIAGNOSIS — C539 Malignant neoplasm of cervix uteri, unspecified: Secondary | ICD-10-CM

## 2021-11-18 DIAGNOSIS — Z90710 Acquired absence of both cervix and uterus: Secondary | ICD-10-CM

## 2021-11-18 MED ORDER — LEVETIRACETAM 500 MG PO TABS
500.0000 mg | ORAL_TABLET | Freq: Two times a day (BID) | ORAL | 1 refills | Status: DC
  Filled 2021-11-18: qty 60, 30d supply, fill #0
  Filled 2022-01-04: qty 60, 30d supply, fill #1
  Filled 2022-01-29: qty 60, 30d supply, fill #2

## 2021-11-18 MED ORDER — LEVOTHYROXINE SODIUM 50 MCG PO TABS
50.0000 ug | ORAL_TABLET | Freq: Every day | ORAL | 1 refills | Status: DC
Start: 2021-11-18 — End: 2022-05-23
  Filled 2021-11-18 (×2): qty 30, 30d supply, fill #0
  Filled 2021-11-21: qty 30, 30d supply, fill #1
  Filled 2022-01-23: qty 30, 30d supply, fill #2
  Filled 2022-03-03: qty 30, 30d supply, fill #3
  Filled 2022-04-03: qty 30, 30d supply, fill #4
  Filled 2022-05-05: qty 30, 30d supply, fill #5

## 2021-11-18 MED ORDER — FLUOXETINE HCL 10 MG PO CAPS
ORAL_CAPSULE | ORAL | 1 refills | Status: DC
  Filled 2021-11-18: qty 30, 30d supply, fill #0
  Filled 2022-01-29: qty 30, 30d supply, fill #1
  Filled 2022-02-15: qty 30, 30d supply, fill #2
  Filled 2022-02-17: qty 90, 90d supply, fill #2
  Filled 2022-04-01 – 2022-08-01 (×2): qty 30, 30d supply, fill #3

## 2021-11-18 NOTE — Progress Notes (Signed)
Requested PD-L1 on accession [id number] with [facility] Pathology via email.

## 2021-11-18 NOTE — Patient Instructions (Signed)
It was good to see you today.  You are healing well from surgery.  Remember, no heavy lifting until 6 weeks after surgery and nothing in the vagina for at least 8 weeks.  I will see you 3 months after your visit with Dr. Sondra Come.  We will plan to get a CT scan in about 3 months.  If this is negative, we will only get imaging in the future if you develop concerning symptoms or there is a finding on your exam that raises the concern for cancer recurrence.

## 2021-11-18 NOTE — Progress Notes (Signed)
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  Due to the abnormal bleeding that she was receiving she was then recommended to follow-up with her gynecologist, Dr. Talbert Nan, who first evaluated the endometrium with an endometrial biopsy performed on 05/20/2021 which revealed benign inactive atrophic endometrium negative for hyperplasia or malignancy.  The bleeding continued and the patient returned for a colposcopic evaluation of the cervix with biopsies (due to the presence of high risk HPV).  This took place on 06/17/2021.  At the time of that colposcopic evaluation there was some abnormal bleeding noted and friability to the cervix.  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   05/20/2021 Pathology Results   FINAL MICROSCOPIC DIAGNOSIS:   A. ENDOMETRIUM, BIOPSY:  - Benign inactive atrophic endometrium  - Negative for hyperplasia or malignancy   05/30/2021 Imaging   US pelvis Small anteverted uterus Thin, symmetrical endometrium Normal ovaries bilaterally No free fluid.   06/17/2021 Pathology Results   FINAL MICROSCOPIC DIAGNOSIS:   A. CERVIX, 6 O'CLOCK, BIOPSY:  -  Invasive squamous cell carcinoma  -  See comment   B. ENDOCERVIX, CURETTAGE:  -  Squamous cell carcinoma  -  See comment   COMMENT:   A.  The carcinoma is positive for p16 and p40 supporting the diagnosis of invasive squamous cell carcinoma.      07/11/2021 PET scan   Intense hypermetabolic activity associated with ill-defined nodular enlargement of the cervix/lower uterine segment, consistent with patient's biopsy proven cervical neoplasm.   No definite scintigraphic evidence of metastatic disease in the neck, chest, abdomen or pelvis.   Right middle lobe 4-5 mm perifissural pulmonary nodule unchanged in size from October 25, 2020 and below the resolution of PET-CT but favored to reflect a peri- fissural lymph node, attention on follow-up studies suggested   Nodular focus of hypermetabolic activity along the right anterior sublingual region, which is nonspecific correlation with direct visualization is suggested.   Hypermetabolic hyperplasia of the tonsils with a hypermetabolic 5 mm left level 1b cervical lymph node, favored reactive.   Hypermetabolic activity associated with callus formation in the right anterior sixth rib likely representing a healing nonpathologic rib fracture.   07/19/2021 Initial Diagnosis   Malignant neoplasm of cervix ([employer])   07/23/2021 Cancer Staging   Staging form: Cervix Uteri, AJCC Version 9 - Clinical stage from 07/23/2021: FIGO Stage IB3 (cT1b3, cN0, cM0) - Signed by [name], MD on 07/23/2021 Stage prefix: Initial diagnosis    07/25/2021 Imaging   1. No acute intracranial abnormality. 2. Minimal multifocal hyperintense T2-weight signal within the white matter, nonspecific. This may be seen in the setting of chronic small vessel disease or migraine headaches.     07/25/2021 Imaging   CT head No acute intracranial abnormality.   07/30/2021 Procedure   Successful placement of a power injectable Port-A-Cath via the right internal jugular vein. The catheter is ready for immediate use.     08/02/2021 -  Chemotherapy  Patient is on Treatment Plan : Cervical Cisplatin q7d     10/24/2021 Surgery   TRH/BSO  Findings: On EUA, small mobile uterus. What appears to be residual tumor (approximately  1cm) replaces the cervical os. On intra-abdominal entry, normal upper abdominal survey. Normal omentum, small and large bowel. Uterus 6cm, normal appearing adnexa. Mild retroperitoneal edema and fibrosis given recent RT. Moderate adhesions between bladder and cervix. Good visual margin taken around the cervix while trying to preserve as much vaginal length as possible. No intra-abdominal or pelvic evidence of disease.   10/24/2021 Pathology Results   A. UTERUS, CERVIX, BILATERAL FALLOPIAN TUBES AND OVARIES:  - Invasive adenosquamous carcinoma of the cervix, see comment  - Carcinoma invades for depth of 0.6 cm  - Resection margins are negative for carcinoma  - Endometriosis of the cervix  - Uterus with benign inactive endometrium  - Benign unremarkable bilateral fallopian tubes and ovaries  - See oncology table      COMMENT:   Most of the tumor shows a squamous morphology with patchy areas of  glandular differentiation. Immunostains show that the tumor cells are  positive for p16, CK5/6 and show evidence of mucin production with  mucicarmine special stain.  Immunostain for p63 is negative.  This  immunoprofile is consistent with above interpretation.     ONCOLOGY TABLE:   UTERINE CERVIX, CARCINOMA: Resection   Procedure: Total hysterectomy and bilateral salpingo-oophorectomy  Tumor Size: 0.8 cm (as measured on HE glass slide)  Histologic Type: Adenosquamous carcinoma  Histologic Grade: G3: Poorly differentiated  Stromal Invasion: Present       Depth of stromal invasion (mm): 6 mm  Other Tissue/ Organ: Not applicable  Margins: All margins negative for invasive carcinoma  Margin Status for HSIL or AIS: All margins negative for HSIL or AIS  Lymphovascular invasion: Not identified       Regional Lymph Nodes: Not applicable (no lymph nodes submitted or  found)  Distant Metastasis:       Distant sites involved: Not applicable  Pathologic Stage Classification (pTNM, AJCC 8th  Edition): pT1b1, pN not  assigned  Ancillary Studies: Can be performed upon request  Representative Tumor Block: A6      Interval History: Patient is overall doing very well.  She denies any abdominal or pelvic pain.  Reports incisions are healing well.  Denies any vaginal bleeding or discharge.  Endorses regular bowel and bladder function.  Occasionally, feels like she has to urinate and does not void very much.  Endorses a good appetite.  Past Medical/Surgical History: Past Medical History:  Diagnosis Date   Anxiety    Arthritis    Arthritis of ankle    Right   Cervical cancer ([place])    Depression    Episode of loss of consciousness    Hypothyroidism    Insomnia    Pure hypercholesterolemia    Seizure ([place])     Past Surgical History:  Procedure Laterality Date   ANKLE SURGERY Right    [facility]   IR IMAGING GUIDED PORT INSERTION  07/30/2021   OPERATIVE ULTRASOUND N/A 09/26/2021   Procedure: OPERATIVE ULTRASOUND;  Surgeon: Gery Pray, MD;  Location: [facility];  Service: Urology;  Laterality: N/A;   ROBOTIC ASSISTED TOTAL HYSTERECTOMY WITH BILATERAL SALPINGO OOPHERECTOMY Bilateral 10/24/2021   Procedure: XI ROBOTIC ASSISTED TOTAL HYSTERECTOMY WITH BILATERAL SALPINGO OOPHORECTOMY;  Surgeon: Lafonda Mosses, MD;  Location: [facility];  Service: Gynecology;  Laterality: Bilateral;   TANDEM RING INSERTION N/A 09/26/2021  Procedure: TANDEM RING INSERTION;  Surgeon: Gery Pray, MD;  Location: [facility];  Service: Urology;  Laterality: N/A;   WISDOM TOOTH EXTRACTION      Family History  Problem Relation Age of Onset   Heart attack Father    Diabetes Brother    Pancreatic cancer Maternal Grandmother    Ovarian cancer Other    Endometrial cancer Other    Colon cancer Other    Prostate cancer Other    Pancreatic cancer Other    Sudden death Neg Hx    Hyperlipidemia Neg Hx    Hypertension Neg Hx    Breast cancer Neg Hx     Social History   Socioeconomic History   Marital  status: Married    Spouse name: Not on file   Number of children: 2   Years of education: Not on file   Highest education level: Not on file  Occupational History   Not on file  Tobacco Use   Smoking status: Former    Packs/day: 0.25    Years: 25.00    Pack years: 6.25    Types: Cigarettes    Quit date: 2016    Years since quitting: 7.0   Smokeless tobacco: Never   Tobacco comments:    20+ yrs  Vaping Use   Vaping Use: Never used  Substance and Sexual Activity   Alcohol use: Yes    Comment: 14 drinks a week of wine   Drug use: Yes    Frequency: 7.0 times per week    Types: Marijuana    Comment: Last use 07-10-21   Sexual activity: Not Currently    Birth control/protection: Post-menopausal  Other Topics Concern   Not on file  Social History Narrative   Not on file   Social Determinants of Health   Financial Resource Strain: Not on file  Food Insecurity: Not on file  Transportation Needs: Not on file  Physical Activity: Not on file  Stress: Not on file  Social Connections: Not on file    Current Medications:  Current Outpatient Medications:    ALPRAZolam (XANAX) 0.5 MG tablet, TAKE ONE TABLET BY MOUTH  AT BEDTIME AS NEEDED FOR ANXIETY, Disp: 90 tablet, Rfl: 1   Bioflavonoid Products (ESTER-C) TABS, Take 1 tablet by mouth daily., Disp: , Rfl:    Brimonidine Tartrate (LUMIFY) 0.025 % SOLN, Place 1 drop into both eyes daily as needed (red eyes)., Disp: , Rfl:    FLUoxetine (PROZAC) 10 MG capsule, TAKE ONE CAPSULE BY MOUTH DAILY, Disp: 90 capsule, Rfl: 1   lamoTRIgine (LAMICTAL) 25 MG tablet, Take 1 tablet (25 mg total) by mouth 2 (two) times daily., Disp: 180 tablet, Rfl: 3   levETIRAcetam (KEPPRA) 500 MG tablet, Take 1 tablet (500 mg total) by mouth 2 (two) times daily., Disp: 180 tablet, Rfl: 1   levothyroxine (SYNTHROID) 50 MCG tablet, Take 1 tablet (50 mcg total) by mouth daily before breakfast., Disp: 90 tablet, Rfl: 1   Magnesium 400 MG CAPS, Take 400 mg by mouth  daily., Disp: , Rfl:    rosuvastatin (CRESTOR) 10 MG tablet, Take 10 mg by mouth daily., Disp: , Rfl:    zinc gluconate 50 MG tablet, Take 50 mg by mouth daily., Disp: , Rfl:    zolpidem (AMBIEN) 10 MG tablet, TAKE ONE TABLET BY MOUTH EVERY NIGHT AT BEDTIME AS NEEDED, Disp: 30 tablet, Rfl: 2   diphenoxylate-atropine (LOMOTIL) 2.5-0.025 MG tablet, Take 1 tablet by mouth 4 (four) times daily as  needed for diarrhea or loose stools. (Patient not taking: Reported on 11/18/2021), Disp: 30 tablet, Rfl: 0   guaiFENesin (ROBITUSSIN) 100 MG/5ML liquid, Take 5 mLs by mouth every 4 (four) hours as needed for cough or to loosen phlegm. (Patient not taking: Reported on 11/18/2021), Disp: , Rfl:    ibuprofen (ADVIL) 800 MG tablet, Take 1 tablet by mouth every 8 hours as needed for moderate pain (after surgery only) (Patient not taking: Reported on 11/18/2021), Disp: 30 tablet, Rfl: 1   lidocaine-prilocaine (EMLA) cream, Apply to affected area once (Patient not taking: Reported on 11/18/2021), Disp: 30 g, Rfl: 3   loperamide (IMODIUM) 2 MG capsule, Take 2 mg by mouth as needed for diarrhea or loose stools. (Patient not taking: Reported on 11/18/2021), Disp: , Rfl:    ondansetron (ZOFRAN) 8 MG tablet, Take 1 tablet (8 mg total) by mouth every 8 (eight) hours as needed. (Patient not taking: Reported on 11/18/2021), Disp: 30 tablet, Rfl: 1   oxyCODONE (OXY IR/ROXICODONE) 5 MG immediate release tablet, Take 1 tablet by mouth every 4 hours as needed for severe pain. (for AFTER surgery only) -- do not take and drive (Patient not taking: Reported on 11/18/2021), Disp: 15 tablet, Rfl: 0   prochlorperazine (COMPAZINE) 10 MG tablet, Take 1 tablet (10 mg total) by mouth every 6 (six) hours as needed for nausea or vomiting. (Patient not taking: Reported on 11/18/2021), Disp: 30 tablet, Rfl: 1   senna-docusate (SENOKOT-S) 8.6-50 MG tablet, Take 2 tablets by mouth at bedtime. For AFTER surgery, do not take if having diarrhea (Patient not  taking: Reported on 11/18/2021), Disp: 30 tablet, Rfl: 0  Review of Systems: Denies appetite changes, fevers, chills, fatigue, unexplained weight changes. Denies hearing loss, neck lumps or masses, mouth sores, ringing in ears or voice changes. Denies cough or wheezing.  Denies shortness of breath. Denies chest pain or palpitations. Denies leg swelling. Denies abdominal distention, pain, blood in stools, constipation, diarrhea, nausea, vomiting, or early satiety. Denies pain with intercourse, dysuria, frequency, hematuria or incontinence. Denies hot flashes, pelvic pain, vaginal bleeding or vaginal discharge.   Denies joint pain, back pain or muscle pain/cramps. Denies itching, rash, or wounds. Denies dizziness, headaches, numbness or seizures. Denies swollen lymph nodes or glands, denies easy bruising or bleeding. Denies anxiety, depression, confusion, or decreased concentration.  Physical Exam: BP 108/77 (BP Location: Right Arm, Patient Position: Sitting)    Pulse 77    Temp 98.1 F (36.7 C) (Oral)    Resp 16    Ht 5\' 9"  (1.753 m)    Wt 166 lb 4.8 oz (75.4 kg)    SpO2 100%    BMI 24.56 kg/m  General: Alert, oriented, no acute distress. HEENT: Normocephalic, atraumatic, sclera anicteric. Chest: Clear to auscultation bilaterally.  No wheezes or rhonchi. Cardiovascular: Regular rate and rhythm, no murmurs. Abdomen: soft, nontender.  Normoactive bowel sounds.  No masses or hepatosplenomegaly appreciated.  Well-healed incisions, suture excised from to right lateral incisions. Extremities: Grossly normal range of motion.  Warm, well perfused.  No edema bilaterally. Skin: No rashes or lesions noted. GU: Normal appearing external genitalia without erythema, excoriation, or lesions.  Speculum exam reveals cuff intact, suture visible.  No bleeding or discharge.  Bimanual exam reveals cuff intact, no fluctuance or tenderness on palpation.    Laboratory & Radiologic Studies: None new  Assessment  & Plan: GRISELLE RUFER is a 56 y.o. woman with Stage IB3 SCC of the cervix status post completion surgery after  pelvic radiation and one HDR treatment who presents for follow-up.  Patient is doing well from a postoperative standpoint and meeting all milestones.  Discussed continued expectations as well as postoperative restrictions.  She is scheduled to see Dr. Sondra Come and in February.  I will see her back for a visit 3 months after that.  We discussed plan to alternate visits initially every 3 months between our 2 offices.  We will plan to get posttreatment imaging about 3 months after surgery.  If this is negative for evidence of disease, then her port can be removed.  We discussed that subsequently, surveillance visits will include review of symptoms as well as an exam.  If she were develop concerning symptoms or have findings on exam that were suspicious for recurrence, additional imaging would be undertaken at that time.  Symptoms that should prompt a phone call before her neck scheduled visit were reviewed today.  32 minutes of total time was spent for this patient encounter, including preparation, face-to-face counseling with the patient and coordination of care, and documentation of the encounter.  Jeral Pinch, MD  Division of Gynecologic Oncology  Department of Obstetrics and Gynecology  [facility]

## 2021-11-21 ENCOUNTER — Encounter: Payer: Self-pay | Admitting: Radiology

## 2021-11-21 ENCOUNTER — Other Ambulatory Visit (HOSPITAL_COMMUNITY): Payer: Self-pay

## 2021-11-21 ENCOUNTER — Encounter: Payer: Self-pay | Admitting: Hematology and Oncology

## 2021-11-21 ENCOUNTER — Telehealth: Payer: Self-pay

## 2021-11-21 NOTE — Telephone Encounter (Signed)
Patient returning call regarding her CT scan. Advised patient that her scan has been scheduled for April 20 th at 2:30 pm. Instructed patient to be NPO 4 hours prior to scan. She is to drink her 1st bottle of contrast at 12:30 pm and the second bottle at 1:30 pm. Patient verbalizes understanding and requests information be sent via mychart.

## 2021-11-22 ENCOUNTER — Telehealth: Payer: Self-pay | Admitting: *Deleted

## 2021-11-22 NOTE — Telephone Encounter (Signed)
Returned patient's phone call, spoke with patient 

## 2021-11-28 ENCOUNTER — Ambulatory Visit: Payer: Self-pay | Admitting: Radiation Oncology

## 2021-12-10 ENCOUNTER — Other Ambulatory Visit (HOSPITAL_COMMUNITY): Payer: Self-pay

## 2021-12-13 ENCOUNTER — Other Ambulatory Visit: Payer: Self-pay

## 2021-12-13 MED ORDER — ROSUVASTATIN CALCIUM 10 MG PO TABS: 10.0000 mg | ORAL_TABLET | Freq: Every day | ORAL | 0 refills | Status: DC

## 2021-12-19 ENCOUNTER — Telehealth: Payer: Self-pay | Admitting: *Deleted

## 2021-12-19 ENCOUNTER — Ambulatory Visit: Payer: 59 | Admitting: Radiation Oncology

## 2021-12-19 NOTE — Telephone Encounter (Signed)
Returned patient's phone call, lvm  to reschedule

## 2021-12-26 ENCOUNTER — Telehealth: Payer: Self-pay | Admitting: Oncology

## 2021-12-26 DIAGNOSIS — C539 Malignant neoplasm of cervix uteri, unspecified: Secondary | ICD-10-CM

## 2021-12-26 NOTE — Telephone Encounter (Signed)
Called Derra regarding port removal.  She said she would like it removed.  Advised we will place the order and IR will call her to schedule the appointment. ?

## 2021-12-27 ENCOUNTER — Telehealth: Payer: Self-pay | Admitting: Oncology

## 2021-12-27 NOTE — Telephone Encounter (Signed)
Left a message with appointment for port removal on 01/03/22 with arrival at 1:30 to [facility]. Gave instructions that she will need a driver and needs to be NPO after 9 am that morning.  Also gave her the number for IR to reschedule if needed. ?

## 2021-12-28 IMAGING — PT NM PET TUM IMG INITIAL (PI) SKULL BASE T - THIGH
2 series · 5 of 5 positions shown · non-contrast
Comparison: CT October 25, 2020.

CLINICAL DATA: Initial treatment strategy for uterine/cervical
cancer.

EXAM:
NUCLEAR MEDICINE PET SKULL BASE TO THIGH
TECHNIQUE: 8.1 mCi F-18 FDG was injected intravenously. Full-ring PET imaging
was performed from the skull base to thigh after the radiotracer. CT
data was obtained and used for attenuation correction and anatomic
localization.
Fasting blood glucose: 107 mg/dl

[Series 1056: results mm oncology reading · 1.0mm · 0.45mm/px · 4 of 4 slices shown (1 of 2)]
[im 1/4]
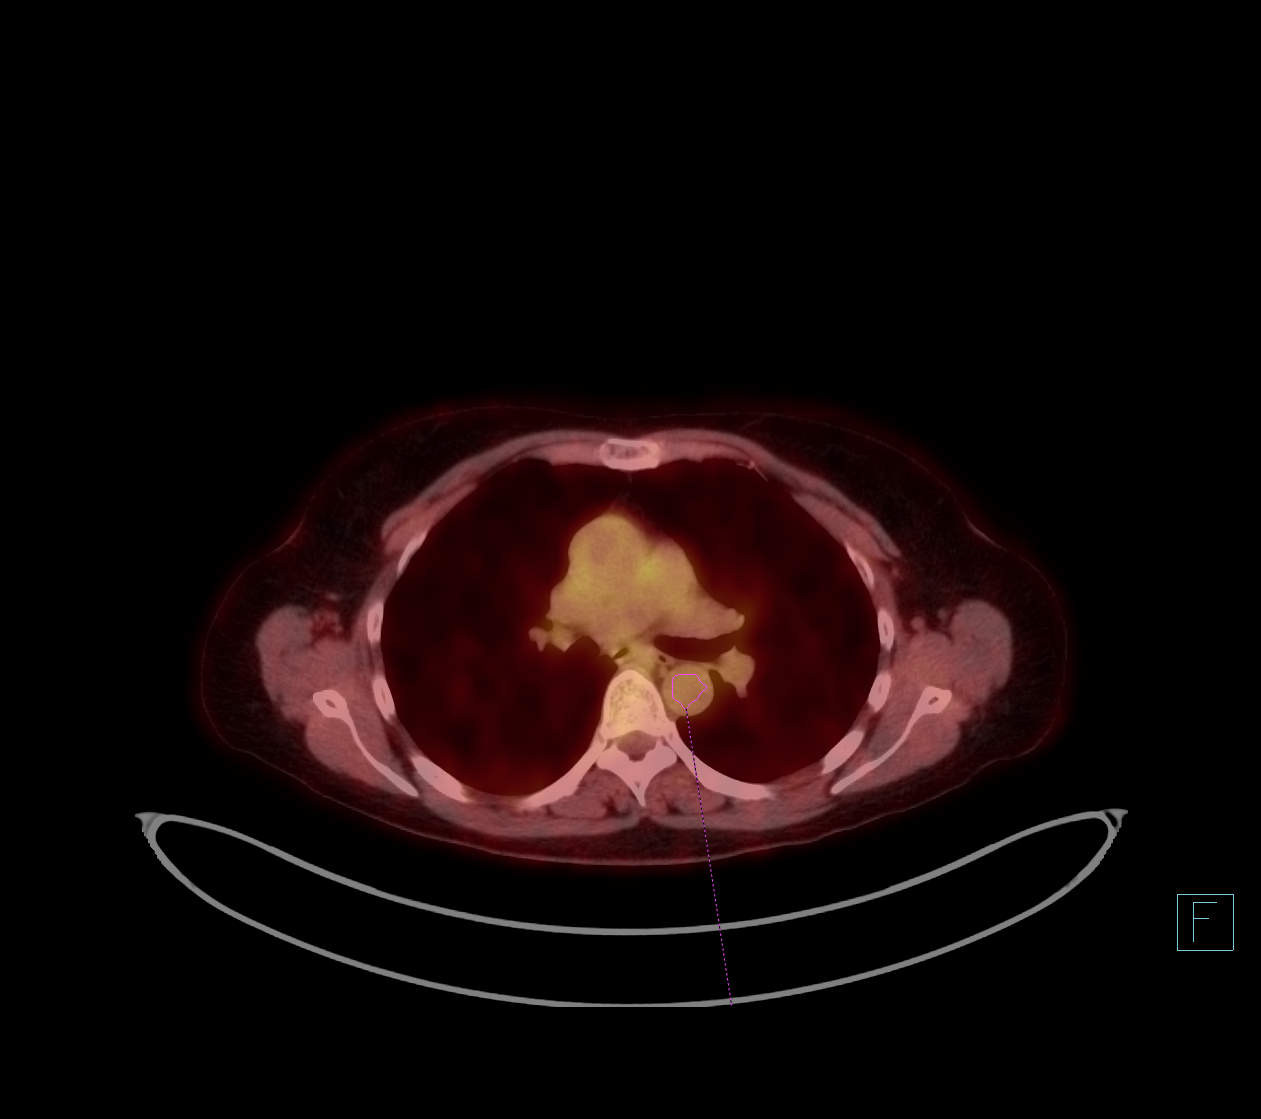
[im 2/4]
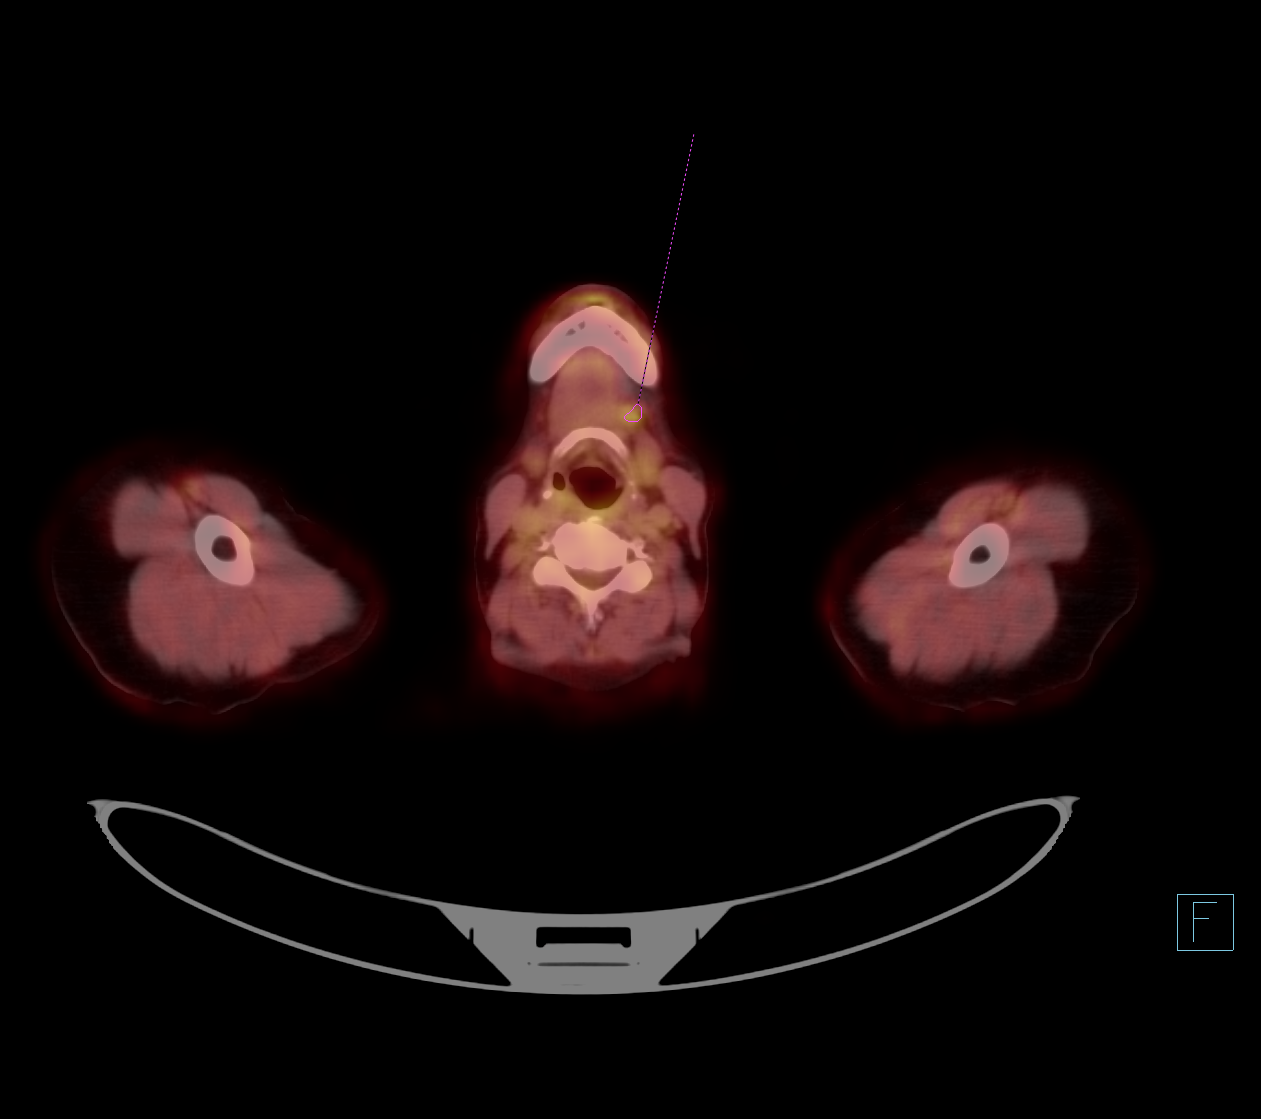
[im 3/4]
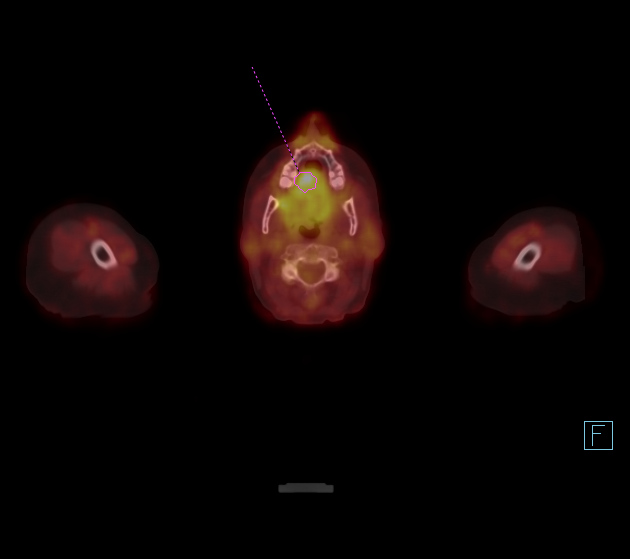
[im 4/4]
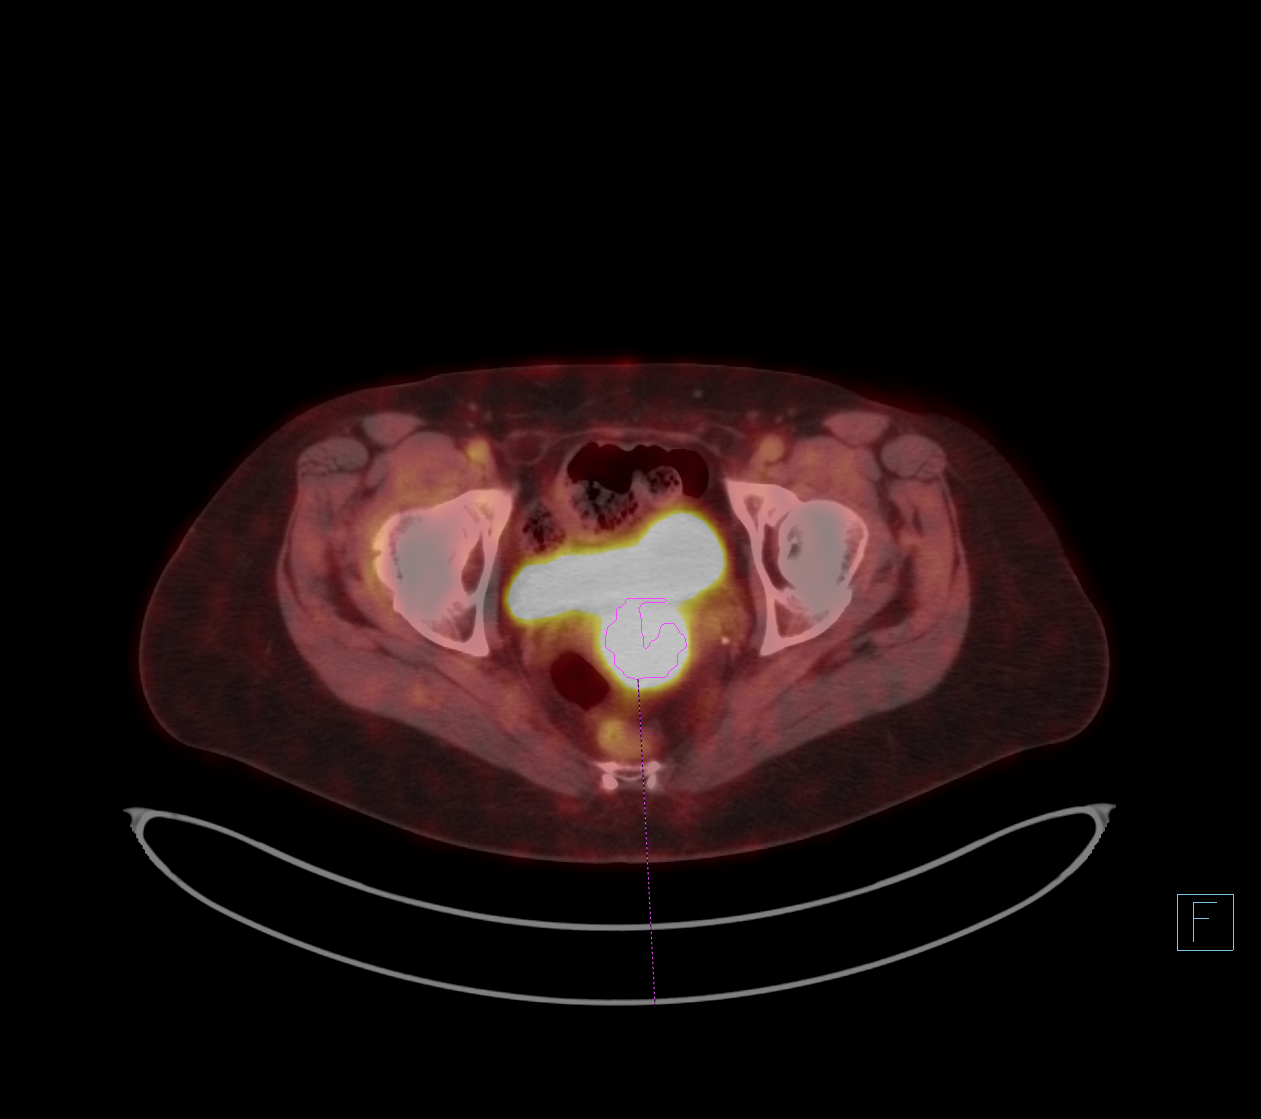

[Series 1078: results mm oncology reading · 1.0mm · 0.89mm/px · 1 of 1 slices shown (2 of 2)]
[im 1/1]
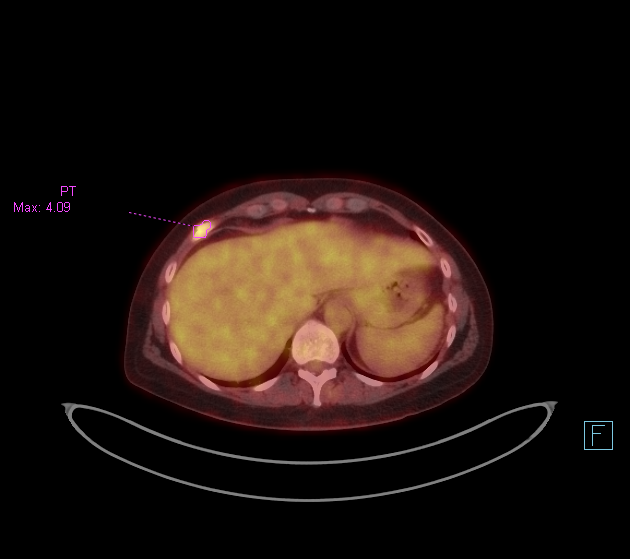

[5 of 5 positions shown; findings below may reference images not displayed]

FINDINGS: Mediastinal blood pool activity: SUV max

Liver activity: SUV max NA

NECK: Hypermetabolic 5 mm left level 1b lymph node on CT image 42/4
with a max SUV of 3.39.

Mild the slightly asymmetric to the right hypermetabolic hyperplasia
of the tonsils, favored reactive.

Nodular focus of hypermetabolic activity with a max SUV of
along the right anterior sublingual region on axial fused image 197.

Incidental CT findings: Dental hardware.

CHEST: No hypermetabolic mediastinal or hilar nodes.

Peri-fissural 4-5 mm right middle lobe pulmonary nodule on image
46/8 unchanged in size from CT October 25, 2020 which is below the
resolution of PET-CT, nonspecific but favored represent a
peri-fissural lymph node no additional suspicious pulmonary nodules
or masses.

Incidental CT findings: No focal airspace consolidation. Normal
caliber aorta and central pulmonary arteries. Normal size heart. No
significant pericardial effusion/thickening.

ABDOMEN/PELVIS: Intense hypermetabolic activity associated with
ill-defined nodular enlargement of the cervix/left lower uterine
segment with a max SUV of 18.08.

No abnormal hypermetabolic activity within the liver, pancreas,
adrenal glands or spleen.

No hypermetabolic adenopathy in the abdomen or pelvis.

Incidental CT findings: Unremarkable noncontrast appearance of the
liver, pancreas, spleen and kidneys. No evidence of bowel
obstruction. Sigmoid colonic diverticulosis without findings of
acute diverticulitis.

SKELETON: Hypermetabolic activity associated with callus formation
in the right anterior sixth rib without associated aggressive lytic
or blastic lesion of bone on image 109/4, likely reflecting a
healing nonpathologic rib fracture.

No definite abnormal hypermetabolic activity to suggest skeletal
metastasis.

Incidental CT findings: none
IMPRESSION: Intense hypermetabolic activity associated with ill-defined nodular
enlargement of the cervix/lower uterine segment, consistent with
patient's biopsy proven cervical neoplasm.

No definite scintigraphic evidence of metastatic disease in the
neck, chest, abdomen or pelvis.

Right middle lobe 4-5 mm perifissural pulmonary nodule unchanged in
size from October 25, 2020 and below the resolution of PET-CT but
favored to reflect a peri- fissural lymph node, attention on
follow-up studies suggested

Nodular focus of hypermetabolic activity along the right anterior
sublingual region, which is nonspecific correlation with direct
visualization is suggested.

Hypermetabolic hyperplasia of the tonsils with a hypermetabolic 5 mm
left level 1b cervical lymph node, favored reactive.

Hypermetabolic activity associated with callus formation in the
right anterior sixth rib likely representing a healing nonpathologic
rib fracture.

## 2021-12-31 NOTE — H&P (Signed)
Chief Complaint: Patient was seen in consultation today for tunneled catheter with port removal at the request of [facility]  Referring Physician(s): [name]  Supervising Physician: Aletta Edouard  Patient Status: [facility] - Out-pt  History of Present Illness: Ashley Hammond is a 56 y.o. female W/PMH of anxiety cervical cancer, LOC, radiation therapy, hypothyroidism, insomnia and seizure.  Patient was referred by Dr. Alvy Bimler for Port-A-Cath removal as chemotherapy has been completed.  Past Medical History:  Diagnosis Date   Anxiety    Arthritis    Arthritis of ankle    Right   Cervical cancer ([place])    Depression    Episode of loss of consciousness    History of radiation therapy    Pelvis 07/31/2021-09/03/2021  HDR tandem and ring 09/26/2021  Dr Gery Pray   Hypothyroidism    Insomnia    Pure hypercholesterolemia    Seizure [facility])     Past Surgical History:  Procedure Laterality Date   ANKLE SURGERY Right    [facility]   IR IMAGING GUIDED PORT INSERTION  07/30/2021   OPERATIVE ULTRASOUND N/A 09/26/2021   Procedure: OPERATIVE ULTRASOUND;  Surgeon: Gery Pray, MD;  Location: [facility] ORS;  Service: Urology;  Laterality: N/A;   ROBOTIC ASSISTED TOTAL HYSTERECTOMY WITH BILATERAL SALPINGO OOPHERECTOMY Bilateral 10/24/2021   Procedure: XI ROBOTIC ASSISTED TOTAL HYSTERECTOMY WITH BILATERAL SALPINGO OOPHORECTOMY;  Surgeon: Lafonda Mosses, MD;  Location: [facility] ORS;  Service: Gynecology;  Laterality: Bilateral;   TANDEM RING INSERTION N/A 09/26/2021   Procedure: TANDEM RING INSERTION;  Surgeon: Gery Pray, MD;  Location: [facility] ORS;  Service: Urology;  Laterality: N/A;   WISDOM TOOTH EXTRACTION      Allergies: Patient has no known allergies.  Medications: Prior to Admission medications   Medication Sig Start Date End Date Taking? Authorizing Provider  ALPRAZolam Duanne Moron) 0.5 MG tablet TAKE ONE TABLET BY MOUTH  AT BEDTIME AS NEEDED FOR ANXIETY 10/11/21   [name], Cresenciano Lick, MD   Bioflavonoid Products (ESTER-C) TABS Take 1 tablet by mouth daily.    [provider]  Brimonidine Tartrate (LUMIFY) 0.025 % SOLN Place 1 drop into both eyes daily as needed (red eyes).    [provider]  diphenoxylate-atropine (LOMOTIL) 2.5-0.025 MG tablet Take 1 tablet by mouth 4 (four) times daily as needed for diarrhea or loose stools. Patient not taking: Reported on 11/18/2021 08/15/21   [name], MD  FLUoxetine (PROZAC) 10 MG capsule TAKE ONE CAPSULE BY MOUTH DAILY 11/18/21   Elby Showers, MD  guaiFENesin (ROBITUSSIN) 100 MG/5ML liquid Take 5 mLs by mouth every 4 (four) hours as needed for cough or to loosen phlegm. Patient not taking: Reported on 11/18/2021    [provider]  ibuprofen (ADVIL) 800 MG tablet Take 1 tablet by mouth every 8 hours as needed for moderate pain (after surgery only) Patient not taking: Reported on 11/18/2021 10/23/21   Joylene John D, NP  lamoTRIgine (LAMICTAL) 25 MG tablet Take 1 tablet (25 mg total) by mouth 2 (two) times daily. 08/01/21   Dohmeier, Asencion Partridge, MD  levETIRAcetam (KEPPRA) 500 MG tablet Take 1 tablet (500 mg total) by mouth 2 (two) times daily. 11/18/21 12/19/21  Dohmeier, Asencion Partridge, MD  levothyroxine (SYNTHROID) 50 MCG tablet Take 1 tablet by mouth daily before breakfast. 11/18/21   Elby Showers, MD  lidocaine-prilocaine (EMLA) cream Apply to affected area once Patient not taking: Reported on 11/18/2021 07/22/21   [name], MD  loperamide (IMODIUM) 2 MG capsule Take 2 mg  by mouth as needed for diarrhea or loose stools. Patient not taking: Reported on 11/18/2021    [provider]  Magnesium 400 MG CAPS Take 400 mg by mouth daily.    [provider]  ondansetron (ZOFRAN) 8 MG tablet Take 1 tablet (8 mg total) by mouth every 8 (eight) hours as needed. Patient not taking: Reported on 11/18/2021 07/22/21   [name], MD  oxyCODONE (OXY IR/ROXICODONE) 5 MG immediate release tablet Take 1 tablet by mouth every  4 hours as needed for severe pain. (for AFTER surgery only) -- do not take and drive Patient not taking: Reported on 11/18/2021 10/23/21   Joylene John D, NP  prochlorperazine (COMPAZINE) 10 MG tablet Take 1 tablet (10 mg total) by mouth every 6 (six) hours as needed for nausea or vomiting. Patient not taking: Reported on 11/18/2021 07/22/21   [name], MD  rosuvastatin (CRESTOR) 10 MG tablet Take 1 tablet (10 mg total) by mouth daily. Please make overdue appt with Dr. Johney Frame before anymore refills. Thank you 1st attempt 12/13/21   Freada Bergeron, MD  senna-docusate (SENOKOT-S) 8.6-50 MG tablet Take 2 tablets by mouth at bedtime. For AFTER surgery, do not take if having diarrhea Patient not taking: Reported on 11/18/2021 10/23/21   Joylene John D, NP  zinc gluconate 50 MG tablet Take 50 mg by mouth daily.    [provider]  zolpidem (AMBIEN) 10 MG tablet TAKE ONE TABLET BY MOUTH EVERY NIGHT AT BEDTIME AS NEEDED 06/27/21   Elby Showers, MD     Family History  Problem Relation Age of Onset   Heart attack Father    Diabetes Brother    Pancreatic cancer Maternal Grandmother    Ovarian cancer Other    Endometrial cancer Other    Colon cancer Other    Prostate cancer Other    Pancreatic cancer Other    Sudden death Neg Hx    Hyperlipidemia Neg Hx    Hypertension Neg Hx    Breast cancer Neg Hx     Social History   Socioeconomic History   Marital status: Married    Spouse name: Not on file   Number of children: 2   Years of education: Not on file   Highest education level: Not on file  Occupational History   Not on file  Tobacco Use   Smoking status: Former    Packs/day: 0.25    Years: 25.00    Pack years: 6.25    Types: Cigarettes    Quit date: 2016    Years since quitting: 7.1   Smokeless tobacco: Never   Tobacco comments:    20+ yrs  Vaping Use   Vaping Use: Never used  Substance and Sexual Activity   Alcohol use: Yes    Comment: 14 drinks a week of  wine   Drug use: Yes    Frequency: 7.0 times per week    Types: Marijuana    Comment: Last use 07-10-21   Sexual activity: Not Currently    Birth control/protection: Post-menopausal  Other Topics Concern   Not on file  Social History Narrative   Not on file   Social Determinants of Health   Financial Resource Strain: Not on file  Food Insecurity: Not on file  Transportation Needs: Not on file  Physical Activity: Not on file  Stress: Not on file  Social Connections: Not on file     Review of Systems: A 12 point ROS discussed and  pertinent positives are indicated in the HPI above.  All other systems are negative.  Review of Systems  Constitutional:  Negative for chills and fever.  Respiratory:  Negative for cough and shortness of breath.   Cardiovascular:  Negative for chest pain and leg swelling.  Gastrointestinal:  Negative for abdominal pain.   Vital Signs: BP 114/63    Pulse 81    Temp 98.3 F (36.8 C) (Oral)    Resp 16    Ht '5\' 9"'$  (1.753 m)    Wt 158 lb (71.7 kg)    SpO2 98%    BMI 23.33 kg/m   Physical Exam Constitutional:      Appearance: Normal appearance. She is not ill-appearing.  HENT:     Mouth/Throat:     Mouth: Mucous membranes are dry.     Pharynx: Oropharynx is clear.  Eyes:     Extraocular Movements: Extraocular movements intact.     Pupils: Pupils are equal, round, and reactive to light.  Cardiovascular:     Rate and Rhythm: Normal rate and regular rhythm.     Pulses: Normal pulses.     Heart sounds: Normal heart sounds.  Pulmonary:     Effort: Pulmonary effort is normal. No respiratory distress.     Breath sounds: Normal breath sounds. No stridor. No wheezing, rhonchi or rales.  Abdominal:     General: Bowel sounds are normal. There is no distension.     Tenderness: There is no abdominal tenderness. There is no guarding.  Musculoskeletal:     Right lower leg: No edema.     Left lower leg: No edema.  Skin:    General: Skin is warm and dry.   Neurological:     Mental Status: She is alert and oriented to person, place, and time.  Psychiatric:        Mood and Affect: Mood normal.        Behavior: Behavior normal.        Thought Content: Thought content normal.        Judgment: Judgment normal.    Imaging: No results found.  Labs:  CBC: Recent Labs    08/14/21 1425 08/21/21 1443 08/28/21 1441 10/24/21 0945  WBC 5.7 3.8* 3.0* 3.8*  HGB 11.7* 12.3 11.8* 11.9*  HCT 33.7* 35.3* 33.2* 35.4*  PLT 143* 151 107* 177    COAGS: Recent Labs    07/30/21 1238  INR 1.0    BMP: Recent Labs    08/14/21 1425 08/21/21 1443 08/28/21 1441 10/24/21 0945  NA 139 139 139 137  K 4.3 4.1 4.1 3.8  CL 103 104 104 102  CO2 '22 23 28 25  '$ GLUCOSE 106* 111* 112* 86  BUN '11 9 9 10  '$ CALCIUM 9.6 9.7 9.4 9.2  CREATININE 0.79 0.78 0.79 0.76  GFRNONAA >60 >60 >60 >60    LIVER FUNCTION TESTS: Recent Labs    05/07/21 1216 07/25/21 1725  BILITOT 0.5 0.4  AST 22 31  ALT 16 19  ALKPHOS  --  70  PROT 7.0 7.4  ALBUMIN  --  4.5    TUMOR MARKERS: No results for input(s): AFPTM, CEA, CA199, CHROMGRNA in the last 8760 hours.  Assessment and Plan: History of anxiety cervical cancer, LOC, radiation therapy, hypothyroidism, insomnia and seizure.  Patient was referred by Dr. Alvy Bimler for Port-A-Cath removal as chemotherapy has been completed.  Patient noted to be resting on stretcher.  She is alert and oriented, calm and pleasant.  She  is in no distress. She reports she is NPO per order. Patient does not take blood thinners but does say she took an ibuprofen 2 days ago. No labs needed today Vital signs stable   Risks and benefits of tunneled catheter with port removal was discussed with the patient including, but not limited to bleeding, infection, pneumothorax and need for additional procedures.  All of the patient's questions were answered, patient is agreeable to proceed. Consent signed and in chart.    Thank you for this  interesting consult.  I greatly enjoyed meeting Ashley Hammond and look forward to participating in their care.  A copy of this report was sent to the requesting provider on this date.  Electronically Signed: Tyson Alias, NP 01/03/2022, 3:29 PM   I spent a total of 20 minutes in face to face in clinical consultation, greater than 50% of which was counseling/coordinating care for tunneled catheter with port removal.

## 2022-01-02 ENCOUNTER — Other Ambulatory Visit (HOSPITAL_COMMUNITY): Payer: Self-pay | Admitting: Physician Assistant

## 2022-01-03 ENCOUNTER — Ambulatory Visit (HOSPITAL_COMMUNITY)
Admission: RE | Admit: 2022-01-03 | Discharge: 2022-01-03 | Disposition: A | Discharge status: Home / Self Care | Payer: 59 | Source: Ambulatory Visit | Attending: Hematology and Oncology | Admitting: Hematology and Oncology

## 2022-01-03 ENCOUNTER — Other Ambulatory Visit: Payer: Self-pay

## 2022-01-03 ENCOUNTER — Encounter (HOSPITAL_COMMUNITY): Payer: Self-pay

## 2022-01-03 DIAGNOSIS — Z452 Encounter for adjustment and management of vascular access device: Secondary | ICD-10-CM | POA: Insufficient documentation

## 2022-01-03 DIAGNOSIS — Z923 Personal history of irradiation: Secondary | ICD-10-CM | POA: Insufficient documentation

## 2022-01-03 DIAGNOSIS — Z79899 Other long term (current) drug therapy: Secondary | ICD-10-CM | POA: Insufficient documentation

## 2022-01-03 DIAGNOSIS — R569 Unspecified convulsions: Secondary | ICD-10-CM | POA: Insufficient documentation

## 2022-01-03 DIAGNOSIS — F419 Anxiety disorder, unspecified: Secondary | ICD-10-CM | POA: Diagnosis not present

## 2022-01-03 DIAGNOSIS — Z87891 Personal history of nicotine dependence: Secondary | ICD-10-CM | POA: Insufficient documentation

## 2022-01-03 DIAGNOSIS — Z79890 Hormone replacement therapy (postmenopausal): Secondary | ICD-10-CM | POA: Diagnosis not present

## 2022-01-03 DIAGNOSIS — E039 Hypothyroidism, unspecified: Secondary | ICD-10-CM | POA: Diagnosis not present

## 2022-01-03 DIAGNOSIS — R69 Illness, unspecified: Secondary | ICD-10-CM | POA: Diagnosis not present

## 2022-01-03 DIAGNOSIS — E7800 Pure hypercholesterolemia, unspecified: Secondary | ICD-10-CM | POA: Diagnosis not present

## 2022-01-03 DIAGNOSIS — C539 Malignant neoplasm of cervix uteri, unspecified: Secondary | ICD-10-CM | POA: Insufficient documentation

## 2022-01-03 DIAGNOSIS — G4700 Insomnia, unspecified: Secondary | ICD-10-CM | POA: Diagnosis not present

## 2022-01-03 HISTORY — PX: IR REMOVAL TUN ACCESS W/ PORT W/O FL MOD SED: IMG2290

## 2022-01-03 MED ORDER — MIDAZOLAM HCL 2 MG/2ML IJ SOLN
INTRAMUSCULAR | Status: AC
  Filled 2022-01-03: qty 2

## 2022-01-03 MED ORDER — MIDAZOLAM HCL 2 MG/2ML IJ SOLN
INTRAMUSCULAR | Status: AC | PRN
Start: 2022-01-03 — End: 2022-01-03
  Administered 2022-01-03: 1 mg via INTRAVENOUS

## 2022-01-03 MED ORDER — FENTANYL CITRATE (PF) 100 MCG/2ML IJ SOLN
INTRAMUSCULAR | Status: AC | PRN
  Administered 2022-01-03: 50 ug via INTRAVENOUS

## 2022-01-03 MED ORDER — FENTANYL CITRATE (PF) 100 MCG/2ML IJ SOLN
INTRAMUSCULAR | Status: DC
Start: 2022-01-03 — End: 2022-01-03
  Filled 2022-01-03: qty 2

## 2022-01-03 MED ORDER — FENTANYL CITRATE (PF) 100 MCG/2ML IJ SOLN
INTRAMUSCULAR | Status: AC
  Filled 2022-01-03: qty 2

## 2022-01-03 MED ORDER — LIDOCAINE HCL (PF) 1 % IJ SOLN
INTRAMUSCULAR | Status: AC | PRN
  Administered 2022-01-03: 10 mL via INTRADERMAL

## 2022-01-03 MED ORDER — MIDAZOLAM HCL 2 MG/2ML IJ SOLN
INTRAMUSCULAR | Status: AC | PRN
  Administered 2022-01-03: 1 mg via INTRAVENOUS

## 2022-01-03 MED ORDER — SODIUM CHLORIDE 0.9 % IV SOLN: INTRAVENOUS | Status: DC

## 2022-01-03 MED ORDER — MIDAZOLAM HCL 2 MG/2ML IJ SOLN
INTRAMUSCULAR | Status: AC
  Filled 2022-01-03: qty 4

## 2022-01-03 MED ORDER — LIDOCAINE-EPINEPHRINE 1 %-1:100000 IJ SOLN
INTRAMUSCULAR | Status: AC
  Filled 2022-01-03: qty 1

## 2022-01-03 MED ORDER — MIDAZOLAM HCL 2 MG/2ML IJ SOLN
INTRAMUSCULAR | Status: AC | PRN
  Administered 2022-01-03: 2 mg via INTRAVENOUS

## 2022-01-03 NOTE — Discharge Instructions (Signed)
For questions /concerns may call Interventional Radiology at [phone] ? ?You may remove your dressing and shower tomorrow afternoon ? ?DO NOT use EMLA cream for 2 weeks after port placement as the cream will remove surgical glue on your incision.   Moderate Conscious Sedation, Adult, Care After ?This sheet gives you information about how to care for yourself after your procedure. Your health care provider may also give you more specific instructions. If you have problems or questions, contact your health careprovider. ?What can I expect after the procedure? ?After the procedure, it is common to have: ?Sleepiness for several hours. ?Impaired judgment for several hours. ?Difficulty with balance. ?Vomiting if you eat too soon. ?Follow these instructions at home: ?For the time period you were told by your health care provider: ?Rest. ?Do not participate in activities where you could fall or become injured. ?Do not drive or use machinery. ?Do not drink alcohol. ?Do not take sleeping pills or medicines that cause drowsiness. ?Do not make important decisions or sign legal documents. ?Do not take care of children on your own. ?Eating and drinking ? ?Follow the diet recommended by your health care provider. ?Drink enough fluid to keep your urine pale yellow. ?If you vomit: ?Drink water, juice, or soup when you can drink without vomiting. ?Make sure you have little or no nausea before eating solid foods. ? ?General instructions ?Take over-the-counter and prescription medicines only as told by your health care provider. ?Have a responsible adult stay with you for the time you are told. It is important to have someone help care for you until you are awake and alert. ?Do not smoke. ?Keep all follow-up visits as told by your health care provider. This is important. ?Contact a health care provider if: ?You are still sleepy or having trouble with balance after 24 hours. ?You feel light-headed. ?You keep feeling nauseous or you  keep vomiting. ?You develop a rash. ?You have a fever. ?You have redness or swelling around the IV site. ?Get help right away if: ?You have trouble breathing. ?You have new-onset confusion at home. ?Summary ?After the procedure, it is common to feel sleepy, have impaired judgment, or feel nauseous if you eat too soon. ?Rest after you get home. Know the things you should not do after the procedure. ?Follow the diet recommended by your health care provider and drink enough fluid to keep your urine pale yellow. ?Get help right away if you have trouble breathing or new-onset confusion at home. ?This information is not intended to replace advice given to you by your health care provider. Make sure you discuss any questions you have with your healthcare provider. ? ? ?Implanted Port Removal, Care After ?This sheet gives you information about how to care for yourself after your procedure. Your health care provider may also give you more specific instructions. If you have problems or questions, contact your health careprovider. ?What can I expect after the procedure? ?After the procedure, it is common to have: ?Soreness or pain near your incision. ?Some swelling or bruising near your incision. ?Follow these instructions at home: ?Medicines ?Take over-the-counter and prescription medicines only as told by your health care provider. ?If you were prescribed an antibiotic medicine, take it as told by your health care provider. Do not stop taking the antibiotic even if you start to feel better. ?Bathing ?Do not take baths, swim, or use a hot tub until your health care provider approves. Ask your health care provider if you can take showers. You  may only be allowed to take sponge baths. ?Incision care ?Follow instructions from your health care provider about how to take care of your incision. Make sure you: ?Wash your hands with soap and water before you change your bandage (dressing). If soap and water are not available, use  hand sanitizer. ?Change your dressing as told by your health care provider. ?Keep your dressing dry. ?Leave  skin glue, or adhesive strips in place. These skin closures may need to stay in place for 2 weeks or longer. If adhesive strip edges start to loosen and curl up, you may trim the loose edges.  ?Check your incision area every day for signs of infection. Check for: ?      - More redness, swelling, or pain. ?      - More fluid or blood. ?      - Warmth. ?      - Pus or a bad smell. ? ?Driving ?Do not drive for 24 hours if you were given a medicine to help you relax (sedative) during your procedure. ?If you did not receive a sedative, ask your health care provider when it is safe to drive. ? ?Activity ?Return to your normal activities as told by your health care provider. Ask your health care provider what activities are safe for you. ?Do not lift anything that is heavier than 10 lb (4.5 kg), or the limit that you are told, until your health care provider says that it is safe. ?Do not do activities that involve lifting your arms over your head. ?General instructions ?Do not use any products that contain nicotine or tobacco, such as cigarettes and e-cigarettes. These can delay healing. If you need help quitting, ask your health care provider. ?Keep all follow-up visits as told by your health care provider. This is important. ?Contact a health care provider if: ?You have more redness, swelling, or pain around your incision. ?You have more fluid or blood coming from your incision. ?Your incision feels warm to the touch. ?You have pus or a bad smell coming from your incision. ?You have pain that is not relieved by your pain medicine. ?Get help right away if you have: ?A fever or chills. ?Chest pain. ?Difficulty breathing. ?Summary ?After the procedure, it is common to have pain, soreness, swelling, or bruising near your incision. ?If you were prescribed an antibiotic medicine, take it as told by your health care  provider. Do not stop taking the antibiotic even if you start to feel better. ?Do not drive for 24 hours if you were given a sedative during your procedure. ?Return to your normal activities as told by your health care provider. Ask your health care provider what activities are safe for you. ?This information is not intended to replace advice given to you by your health care provider. Make sure you discuss any questions you have with your healthcare provider. ?Document Revised: 11/26/2017 Document Reviewed: 11/26/2017 ?Elsevier Patient Education ? [employer].  ?

## 2022-01-03 NOTE — Procedures (Signed)
Interventional Radiology Procedure Note ? ?Procedure: Port removal ? ?Complications: None ? ?Estimated Blood Loss: < 10 mL ? ?Findings: ?Right chest port removed utilizing sharp and blunt dissection. Entire port and attached catheter removed. Wound closed. ? ?Eulas Post T. Kathlene Cote, M.D ?Pager:  [phone] ? ?  ?

## 2022-01-04 ENCOUNTER — Other Ambulatory Visit: Payer: Self-pay | Admitting: Cardiology

## 2022-01-04 ENCOUNTER — Other Ambulatory Visit (HOSPITAL_COMMUNITY): Payer: Self-pay

## 2022-01-06 ENCOUNTER — Other Ambulatory Visit: Payer: Self-pay | Admitting: Hematology and Oncology

## 2022-01-20 ENCOUNTER — Encounter: Payer: Self-pay | Admitting: Cardiology

## 2022-01-20 ENCOUNTER — Other Ambulatory Visit: Payer: Self-pay | Admitting: Cardiology

## 2022-01-20 ENCOUNTER — Telehealth: Payer: Self-pay | Admitting: Internal Medicine

## 2022-01-20 MED ORDER — ROSUVASTATIN CALCIUM 10 MG PO TABS
10.0000 mg | ORAL_TABLET | Freq: Every day | ORAL | 3 refills | Status: DC
  Filled 2022-01-20: qty 30, 30d supply, fill #0

## 2022-01-20 NOTE — Telephone Encounter (Signed)
Called patient to let her know she needs to go back and be seen by her cardiologist. She does not want to do that, she would rather wait and get it all taking care of when she comes here for her CPE, she asked to be put on a wait list to be seen sooner than September. Earliest she can be seen for CPE is 05/16/2022. I advised to schedule an appointment with her cardiologist and have her labs run, that the Crestor refill was not the only reason she was seeing them. She verbalized understanding. ?

## 2022-01-20 NOTE — Telephone Encounter (Signed)
Please advise.  Pt is overdue to see you.  She was recalled to see you in Aug 2022 and has not scheduled her follow-up appt.  PCP advised her to schedule a follow-up with Cards for medication management.  ?

## 2022-01-20 NOTE — Telephone Encounter (Signed)
Ashley Hammond ?[phone] ? ?[name] called to see if you would take over refilling below medication, she does not want to go back to the cardiologist for the refills. She was due for 6 month follow up at cardiology 04/2021. Last lipid was 05/07/2021 ?Next office visit here is 07/04/2022 with lab prior. ?

## 2022-01-21 ENCOUNTER — Other Ambulatory Visit (HOSPITAL_COMMUNITY): Payer: Self-pay

## 2022-01-23 ENCOUNTER — Other Ambulatory Visit (HOSPITAL_COMMUNITY): Payer: Self-pay

## 2022-01-29 ENCOUNTER — Other Ambulatory Visit (HOSPITAL_COMMUNITY): Payer: Self-pay

## 2022-02-07 ENCOUNTER — Encounter: Payer: Self-pay | Admitting: Gynecologic Oncology

## 2022-02-07 NOTE — Progress Notes (Signed)
Peer to peer performed for CT AP with Dr. Verna Czech. CT scan approved. Auth # [id number] valid April 14 through Aug 06, 2022. Corene Cornea with [facility] Team notified. ? ?

## 2022-02-13 ENCOUNTER — Ambulatory Visit (HOSPITAL_COMMUNITY)
Admission: RE | Admit: 2022-02-13 | Discharge: 2022-02-13 | Disposition: A | Discharge status: Home / Self Care | Payer: 59 | Source: Ambulatory Visit | Attending: Gynecologic Oncology | Admitting: Gynecologic Oncology

## 2022-02-13 DIAGNOSIS — K449 Diaphragmatic hernia without obstruction or gangrene: Secondary | ICD-10-CM | POA: Diagnosis not present

## 2022-02-13 DIAGNOSIS — Z8541 Personal history of malignant neoplasm of cervix uteri: Secondary | ICD-10-CM | POA: Diagnosis not present

## 2022-02-13 DIAGNOSIS — C539 Malignant neoplasm of cervix uteri, unspecified: Secondary | ICD-10-CM

## 2022-02-13 DIAGNOSIS — C55 Malignant neoplasm of uterus, part unspecified: Secondary | ICD-10-CM | POA: Diagnosis not present

## 2022-02-13 DIAGNOSIS — Q433 Congenital malformations of intestinal fixation: Secondary | ICD-10-CM | POA: Diagnosis not present

## 2022-02-13 MED ORDER — IOHEXOL 300 MG/ML  SOLN
100.0000 mL | Freq: Once | INTRAMUSCULAR | Status: AC | PRN
  Administered 2022-02-13: 100 mL via INTRAVENOUS

## 2022-02-13 MED ORDER — SODIUM CHLORIDE (PF) 0.9 % IJ SOLN
INTRAMUSCULAR | Status: AC
  Filled 2022-02-13: qty 50

## 2022-02-17 ENCOUNTER — Telehealth: Payer: Self-pay | Admitting: Gynecologic Oncology

## 2022-02-17 ENCOUNTER — Other Ambulatory Visit (HOSPITAL_COMMUNITY): Payer: Self-pay

## 2022-02-17 NOTE — Telephone Encounter (Signed)
Called patient with CT results. She was happy with this news. All questions answered. ? ?Jeral Pinch MD ?Gynecologic Oncology ? ?

## 2022-02-27 ENCOUNTER — Inpatient Hospital Stay: Payer: 59 | Attending: Gynecologic Oncology | Admitting: Gynecologic Oncology

## 2022-02-27 DIAGNOSIS — C539 Malignant neoplasm of cervix uteri, unspecified: Secondary | ICD-10-CM

## 2022-02-27 NOTE — Patient Instructions (Addendum)
It was good to see you today.  I do not see or feel any evidence of cancer on your exam.  We will continue with visits every 3 months, alternating between my office and Dr. Sondra Come.  We will see him next in 3 months and see me back in November.  My schedule is not out past the late summer.  Please call back sometime in August or September to get a visit scheduled with me in early November. ? ?Develop any new and concerning symptoms, such as vaginal bleeding or pelvic pain, before your next visit, please call to see me sooner. ?

## 2022-02-27 NOTE — Progress Notes (Unsigned)
Gynecologic Oncology Return Clinic Visit ? ?02/27/2022 ? ?Reason for Visit: Surveillance visit in the setting of cervical cancer ? ?Treatment History: ?Oncology History Overview Note  ?PD-L1 CPS 0% ?  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 The patient reported a history of light vaginal bleeding since approximately May 2022.  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 ? ?Due to the abnormal bleeding that she was receiving she was then recommended to follow-up with her gynecologist, Dr. Talbert Nan, who first evaluated the endometrium with an endometrial biopsy performed on 05/20/2021 which revealed benign inactive atrophic endometrium negative for hyperplasia or malignancy. ? ?The bleeding continued and the patient returned for a colposcopic evaluation of the cervix with biopsies (due to the presence of high risk HPV).  This took place on 06/17/2021.  At the time of that colposcopic evaluation there was some abnormal bleeding noted and friability to the cervix.  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 ?  ?05/20/2021 Pathology Results  ? FINAL MICROSCOPIC DIAGNOSIS:  ? ?A. ENDOMETRIUM, BIOPSY:  ?- Benign inactive atrophic endometrium  ?- Negative for hyperplasia or malignancy ?  ?05/30/2021 Imaging  ? US pelvis ?Small anteverted uterus ?Thin, symmetrical endometrium ?Normal ovaries bilaterally ?No free fluid. ?  ?06/17/2021 Pathology Results  ? FINAL MICROSCOPIC DIAGNOSIS:  ? ?A. CERVIX, 6 O'CLOCK, BIOPSY:  ?-  Invasive squamous cell carcinoma  ?-  See comment  ? ?B. ENDOCERVIX, CURETTAGE:  ?-  Squamous cell carcinoma  ?-  See comment  ? ?COMMENT:  ? ?A.  The carcinoma is positive for p16 and p40 supporting the diagnosis of  invasive squamous cell carcinoma.   ?  ?07/11/2021 PET scan  ? Intense hypermetabolic activity associated with ill-defined nodular enlargement of the cervix/lower uterine segment, consistent with patient's biopsy proven cervical neoplasm. ?  ?No definite scintigraphic evidence of metastatic disease in the neck, chest, abdomen or pelvis. ?  ?Right middle lobe 4-5 mm perifissural pulmonary nodule unchanged in size from October 25, 2020 and below the resolution of PET-CT but favored to reflect a peri- fissural lymph node, attention on follow-up studies suggested ?  ?Nodular focus of hypermetabolic activity along the right anterior sublingual region, which is nonspecific correlation with direct visualization is suggested. ?  ?Hypermetabolic hyperplasia of the tonsils with a hypermetabolic 5 mm left level 1b cervical lymph node, favored reactive. ?  ?Hypermetabolic activity associated with callus formation in the right anterior sixth rib likely representing a healing nonpathologic rib fracture. ?  ?07/19/2021 Initial Diagnosis  ? Malignant neoplasm of cervix [facility]) ?  ?07/23/2021 Cancer Staging  ? Staging form: Cervix Uteri, AJCC Version 9 ?- Clinical stage from 07/23/2021: FIGO Stage IB3 (cT1b3, cN0, cM0) - Signed by [name], MD on 07/23/2021 ?Stage prefix: Initial diagnosis ? ?  ?07/25/2021 Imaging  ? 1. No acute intracranial abnormality. ?2. Minimal multifocal hyperintense T2-weight signal within the white matter, nonspecific. This may be seen in the setting of chronic small vessel disease or migraine headaches. ?  ?  ?07/25/2021 Imaging  ? CT head ?No acute intracranial abnormality. ?  ?07/30/2021 Procedure  ? Successful placement of a power injectable Port-A-Cath via the right internal jugular vein. The catheter is ready for immediate  use. ?  ?  ?08/02/2021 - 08/30/2021 Chemotherapy  ? Patient is on Treatment Plan : Cervical Cisplatin q7d  ? ?  ?  ?10/24/2021 Surgery  ? TRH/BSO ? ?Findings: On EUA, small mobile uterus.  What appears to be residual tumor (approximately 1cm) replaces the cervical os. On intra-abdominal entry, normal upper abdominal survey. Normal omentum, small and large bowel. Uterus 6cm, normal appearing adnexa. Mild retroperitoneal edema and fibrosis given recent RT. Moderate adhesions between bladder and cervix. Good visual margin taken around the cervix while trying to preserve as much vaginal length as possible. No intra-abdominal or pelvic evidence of disease. ?  ?10/24/2021 Pathology Results  ? A. UTERUS, CERVIX, BILATERAL FALLOPIAN TUBES AND OVARIES:  ?- Invasive adenosquamous carcinoma of the cervix, see comment  ?- Carcinoma invades for depth of 0.6 cm  ?- Resection margins are negative for carcinoma  ?- Endometriosis of the cervix  ?- Uterus with benign inactive endometrium  ?- Benign unremarkable bilateral fallopian tubes and ovaries  ?- See oncology table  ? ? ? ? ?COMMENT:  ? ?Most of the tumor shows a squamous morphology with patchy areas of  ?glandular differentiation. Immunostains show that the tumor cells are  ?positive for p16, CK5/6 and show evidence of mucin production with  ?mucicarmine special stain.  Immunostain for p63 is negative.  This  ?immunoprofile is consistent with above interpretation.  ? ? ? ?ONCOLOGY TABLE:  ? ?UTERINE CERVIX, CARCINOMA: Resection  ? ?Procedure: Total hysterectomy and bilateral salpingo-oophorectomy  ?Tumor Size: 0.8 cm (as measured on HE glass slide)  ?Histologic Type: Adenosquamous carcinoma  ?Histologic Grade: G3: Poorly differentiated  ?Stromal Invasion: Present  ?     Depth of stromal invasion (mm): 6 mm  ?Other Tissue/ Organ: Not applicable  ?Margins: All margins negative for invasive carcinoma  ?Margin Status for HSIL or AIS: All margins negative for HSIL or AIS  ?Lymphovascular invasion: Not identified  ?     Regional Lymph Nodes: Not applicable (no lymph nodes submitted or  ?found)  ?Distant Metastasis:  ?     Distant sites involved: Not applicable   ?Pathologic Stage Classification (pTNM, AJCC 8th Edition): pT1b1, pN not  ?assigned  ?Ancillary Studies: Can be performed upon request  ?Representative Tumor Block: A6  ?  ?01/06/2022 Procedure  ? Removal of implanted Port-A-Cath utilizing sharp and blunt dissection. The procedure was uncomplicated. ?  ?  ?02/13/2022 Imaging  ? CT A/P: ?1. No evidence of metastatic disease in the abdomen or pelvis. ?2. Small hiatal hernia. ?3. Congenital small bowel malrotation.  No acute abnormality. ?  ? ? ?Interval History: ?*** ? ?She had her port removed in March. ? ?Patient is overall doing very well.  She denies any abdominal or pelvic pain.  Reports incisions are healing well.  Denies any vaginal bleeding or discharge.  Endorses regular bowel and bladder function.  Occasionally, feels like she has to urinate and does not void very much.  Endorses a good appetite. ? ?Past Medical/Surgical History: ?Past Medical History:  ?Diagnosis Date  ? Anxiety   ? Arthritis   ? 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 Cervical cancer ([place])   ? Depression   ? Episode of loss of consciousness   ? History of radiation therapy   ? Pelvis 07/31/2021-09/03/2021  HDR tandem and ring 09/26/2021  Dr Gery Pray  ? Hypothyroidism   ? Insomnia   ? Pure hypercholesterolemia   ? Seizure ([place])   ? ? ?Past Surgical  History:  ?Procedure Laterality Date  ? ANKLE SURGERY Right   ? [facility]  ? IR IMAGING GUIDED PORT INSERTION  07/30/2021  ? IR REMOVAL TUN ACCESS W/ PORT W/O FL MOD SED  01/03/2022  ? OPERATIVE ULTRASOUND N/A 09/26/2021  ? Procedure: OPERATIVE ULTRASOUND;  Surgeon: Gery Pray, MD;  Location: [facility];  Service: Urology;  Laterality: N/A;  ? ROBOTIC ASSISTED TOTAL HYSTERECTOMY WITH BILATERAL SALPINGO OOPHERECTOMY Bilateral 10/24/2021  ? Procedure: XI ROBOTIC ASSISTED TOTAL HYSTERECTOMY WITH BILATERAL SALPINGO OOPHORECTOMY;  Surgeon: Lafonda Mosses, MD;  Location: [facility];  Service: Gynecology;  Laterality: Bilateral;  ? TANDEM RING INSERTION N/A 09/26/2021   ? Procedure: TANDEM RING INSERTION;  Surgeon: Gery Pray, MD;  Location: [facility];  Service: Urology;  Laterality: N/A;  ? WISDOM TOOTH EXTRACTION    ? ? ?Family History  ?Problem Relation Age of Onset  ? He

## 2022-03-03 ENCOUNTER — Other Ambulatory Visit (HOSPITAL_COMMUNITY): Payer: Self-pay

## 2022-04-01 ENCOUNTER — Other Ambulatory Visit (HOSPITAL_COMMUNITY): Payer: Self-pay

## 2022-04-03 ENCOUNTER — Other Ambulatory Visit (HOSPITAL_COMMUNITY): Payer: Self-pay

## 2022-05-06 ENCOUNTER — Other Ambulatory Visit (HOSPITAL_COMMUNITY): Payer: Self-pay

## 2022-05-12 ENCOUNTER — Other Ambulatory Visit (HOSPITAL_COMMUNITY): Payer: Self-pay

## 2022-05-20 ENCOUNTER — Emergency Department (HOSPITAL_COMMUNITY)
Admission: EM | Admit: 2022-05-20 | Discharge: 2022-05-21 | Disposition: A | Discharge status: Home / Self Care | Payer: 59 | Attending: Emergency Medicine | Admitting: Emergency Medicine

## 2022-05-20 ENCOUNTER — Other Ambulatory Visit: Payer: Self-pay

## 2022-05-20 ENCOUNTER — Encounter (HOSPITAL_COMMUNITY): Payer: Self-pay

## 2022-05-20 ENCOUNTER — Emergency Department (HOSPITAL_COMMUNITY): Payer: 59

## 2022-05-20 DIAGNOSIS — Z743 Need for continuous supervision: Secondary | ICD-10-CM | POA: Diagnosis not present

## 2022-05-20 DIAGNOSIS — R7309 Other abnormal glucose: Secondary | ICD-10-CM | POA: Insufficient documentation

## 2022-05-20 DIAGNOSIS — R569 Unspecified convulsions: Secondary | ICD-10-CM | POA: Insufficient documentation

## 2022-05-20 DIAGNOSIS — I10 Essential (primary) hypertension: Secondary | ICD-10-CM | POA: Insufficient documentation

## 2022-05-20 DIAGNOSIS — Z79899 Other long term (current) drug therapy: Secondary | ICD-10-CM | POA: Diagnosis not present

## 2022-05-20 LAB — CBC WITH DIFFERENTIAL/PLATELET
Abs Immature Granulocytes: 0.02 10*3/uL (ref 0.00–0.07)
Basophils Absolute: 0 10*3/uL (ref 0.0–0.1)
Basophils Relative: 0 %
Eosinophils Absolute: 0.2 10*3/uL (ref 0.0–0.5)
Eosinophils Relative: 3 %
HCT: 41.9 % (ref 36.0–46.0)
Hemoglobin: 14.1 g/dL (ref 12.0–15.0)
Immature Granulocytes: 0 %
Lymphocytes Relative: 8 %
Lymphs Abs: 0.5 10*3/uL — ABNORMAL LOW (ref 0.7–4.0)
MCH: 34 pg (ref 26.0–34.0)
MCHC: 33.7 g/dL (ref 30.0–36.0)
MCV: 101 fL — ABNORMAL HIGH (ref 80.0–100.0)
Monocytes Absolute: 0.2 10*3/uL (ref 0.1–1.0)
Monocytes Relative: 4 %
Neutro Abs: 4.8 10*3/uL (ref 1.7–7.7)
Neutrophils Relative %: 85 %
Platelets: 174 10*3/uL (ref 150–400)
RBC: 4.15 MIL/uL (ref 3.87–5.11)
RDW: 12.2 % (ref 11.5–15.5)
WBC: 5.7 10*3/uL (ref 4.0–10.5)
nRBC: 0 % (ref 0.0–0.2)

## 2022-05-20 LAB — BASIC METABOLIC PANEL
Anion gap: 9 (ref 5–15)
BUN: 16 mg/dL (ref 6–20)
CO2: 28 mmol/L (ref 22–32)
Calcium: 9.7 mg/dL (ref 8.9–10.3)
Chloride: 102 mmol/L (ref 98–111)
Creatinine, Ser: 0.75 mg/dL (ref 0.44–1.00)
GFR, Estimated: >60 mL/min (ref >60)
Glucose, Bld: 125 mg/dL — ABNORMAL HIGH (ref 70–99)
Potassium: 4.3 mmol/L (ref 3.5–5.1)
Sodium: 139 mmol/L (ref 135–145)

## 2022-05-20 LAB — CBG MONITORING, ED: Glucose-Capillary: 126 mg/dL — ABNORMAL HIGH (ref 70–99)

## 2022-05-20 NOTE — ED Provider Notes (Signed)
[facility] DEPT Provider Note   CSN: [id number] Arrival date & time: 05/20/22  2153     History  Chief Complaint  Patient presents with   Seizures    Ashley Hammond is a 56 y.o. 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 Brett Fairy with [facility]), hypertension, hyperlipidemia, anxiety, malignancy of cervix status post surgery atrial fibrillation in 1 discharge.  Presents emergency department complaint of seizures.  Patient reports that she had a seizure earlier today but is unsure what time that was.  Patient reports that she has been getting a "horrible smell all day," which is usually precursor for her seizures.  Patient reports compliance with her Keppra and Lamictal medications.  Patient reports prior to today her last seizure was 1 month ago.  EMS reports that they were told that patient has been having multiple seizures throughout the day.  Patient had a seizure witnessed with EMS.  Seizure lasted for approximately 30 minutes and was full body tonic-clonic.  Patient was given 2.5 mg of IV Versed.  Patient's friend reports that she came over at 7:00pm.  She states that patient did not have any seizures prior to EMS arrival.  Patient not suffer any falls or traumatic injuries.   Seizures      Home Medications Prior to Admission medications   Medication Sig Start Date End Date Taking? Authorizing Provider  ALPRAZolam Duanne Moron) 0.5 MG tablet TAKE ONE TABLET BY MOUTH  AT BEDTIME AS NEEDED FOR ANXIETY 10/11/21   [name], Cresenciano Lick, MD  Bioflavonoid Products (ESTER-C) TABS Take 1 tablet by mouth daily.    [provider]  Brimonidine Tartrate (LUMIFY) 0.025 % SOLN Place 1 drop into both eyes daily as needed (red eyes).    [provider]  diphenoxylate-atropine (LOMOTIL) 2.5-0.025 MG tablet Take 1  tablet by mouth 4 (four) times daily as needed for diarrhea or loose stools. Patient not taking: Reported on 11/18/2021 08/15/21   [name], MD  FLUoxetine (PROZAC) 10 MG capsule TAKE ONE CAPSULE BY MOUTH DAILY 11/18/21   Elby Showers, MD  guaiFENesin (ROBITUSSIN) 100 MG/5ML liquid Take 5 mLs by mouth every 4 (four) hours as needed for cough or to loosen phlegm. Patient not taking: Reported on 11/18/2021    [provider]  ibuprofen (ADVIL) 800 MG tablet Take 1 tablet by mouth every 8 hours as needed for moderate pain (after surgery only) 10/23/21   Cross, Lenna Sciara D, NP  lamoTRIgine (LAMICTAL) 25 MG tablet Take 1 tablet (25 mg total) by mouth 2 (two) times daily. 08/01/21   Dohmeier, Asencion Partridge, MD  levETIRAcetam (KEPPRA) 500 MG tablet Take 1 tablet (500 mg total) by mouth 2 (two) times daily. 11/18/21 03/07/22  Dohmeier, Asencion Partridge, MD  levothyroxine (SYNTHROID) 50 MCG tablet Take 1 tablet by mouth daily before breakfast. 11/18/21   [name], Cresenciano Lick, MD  loperamide (IMODIUM) 2 MG capsule Take 2 mg by mouth as needed for diarrhea or loose stools. Patient not taking: Reported on 11/18/2021    [provider]  Magnesium 400 MG CAPS Take 400 mg by mouth daily.    [provider]  oxyCODONE (OXY IR/ROXICODONE) 5 MG immediate release tablet Take 1 tablet by mouth every 4 hours as needed for severe pain. (for AFTER surgery only) -- do not take and drive Patient not taking: Reported on 11/18/2021 10/23/21  Cross, Melissa D, NP  rosuvastatin (CRESTOR) 10 MG tablet Take 1 tablet (10 mg total) by mouth daily. Please make overdue appt with Dr. Johney Frame before anymore refills. Thank you 1st attempt 01/20/22   Freada Bergeron, MD  senna-docusate (SENOKOT-S) 8.6-50 MG tablet Take 2 tablets by mouth at bedtime. For AFTER surgery, do not take if having diarrhea Patient not taking: Reported on 11/18/2021 10/23/21   Joylene John D, NP  zinc gluconate 50 MG tablet Take 50 mg by mouth daily.     [provider]  zolpidem (AMBIEN) 10 MG tablet TAKE ONE TABLET BY MOUTH EVERY NIGHT AT BEDTIME AS NEEDED 06/27/21   Elby Showers, MD  prochlorperazine (COMPAZINE) 10 MG tablet Take 1 tablet (10 mg total) by mouth every 6 (six) hours as needed for nausea or vomiting. Patient not taking: Reported on 11/18/2021 07/22/21 01/06/22  [name], MD      Allergies    Patient has no known allergies.    Review of Systems   Review of Systems  Constitutional:  Negative for chills and fever.  Eyes:  Negative for visual disturbance.  Respiratory:  Negative for shortness of breath.   Cardiovascular:  Negative for chest pain.  Gastrointestinal:  Negative for abdominal pain, nausea and vomiting.  Genitourinary:  Negative for difficulty urinating and dysuria.  Musculoskeletal:  Negative for back pain and neck pain.  Skin:  Negative for color change and rash.  Neurological:  Positive for seizures. Negative for dizziness, syncope, light-headedness and headaches.  Psychiatric/Behavioral:  Negative for confusion.     Physical Exam Updated Vital Signs BP (!) 155/93   Pulse 91   Temp 98.1 F (36.7 C) (Oral)   Resp 18   Ht '5\' 7"'$  (1.702 m)   Wt 68 kg   SpO2 96%   BMI 23.49 kg/m  Physical Exam Vitals and nursing note reviewed.  Constitutional:      General: She is not in acute distress.    Appearance: She is not ill-appearing, toxic-appearing or diaphoretic.  HENT:     Head: Normocephalic.  Eyes:     General: No scleral icterus.       Right eye: No discharge.        Left eye: No discharge.  Cardiovascular:     Rate and Rhythm: Normal rate.  Pulmonary:     Effort: Pulmonary effort is normal.  Musculoskeletal:     Comments: No midline tenderness deformity to cervical, thoracic, or lumbar spine.  Patient has full active range of motion to bilateral upper and lower extremities.  No tenderness, point tenderness, or deformity of bilateral upper or lower extremities.  Skin:    General:  Skin is warm and dry.  Neurological:     General: No focal deficit present.     Mental Status: She is alert and oriented to person, place, and time.     GCS: GCS eye subscore is 4. GCS verbal subscore is 5. GCS motor subscore is 6.     Cranial Nerves: Cranial nerves 2-12 are intact. No cranial nerve deficit, dysarthria or facial asymmetry.  Psychiatric:        Behavior: Behavior is cooperative.     ED Results / Procedures / Treatments   Labs (all labs ordered are listed, but only abnormal results are displayed) Labs Reviewed  BASIC METABOLIC PANEL - Abnormal; Notable for the following components:      Result Value   Glucose, Bld 125 (*)    All other  components within normal limits  CBC WITH DIFFERENTIAL/PLATELET - Abnormal; Notable for the following components:   MCV 101.0 (*)    Lymphs Abs 0.5 (*)    All other components within normal limits  CBG MONITORING, ED - Abnormal; Notable for the following components:   Glucose-Capillary 126 (*)    All other components within normal limits  LAMOTRIGINE LEVEL  LEVETIRACETAM LEVEL    EKG None  Radiology CT HEAD WO CONTRAST (5MM)  Result Date: 05/21/2022 CLINICAL DATA:  Intermittent seizures. EXAM: CT HEAD WITHOUT CONTRAST TECHNIQUE: Contiguous axial images were obtained from the base of the skull through the vertex without intravenous contrast. RADIATION DOSE REDUCTION: This exam was performed according to the departmental dose-optimization program which includes automated exposure control, adjustment of the mA and/or kV according to patient size and/or use of iterative reconstruction technique. COMPARISON:  July 25, 2021 FINDINGS: Brain: No evidence of acute infarction, hemorrhage, hydrocephalus, extra-axial collection or mass lesion/mass effect. Vascular: No hyperdense vessel or unexpected calcification. Skull: Normal. Negative for fracture or focal lesion. Sinuses/Orbits: No acute finding. Other: None. IMPRESSION: No acute  intracranial process. Electronically Signed   By: Virgina Norfolk M.D.   On: 05/21/2022 00:42    Procedures Procedures    Medications Ordered in ED Medications  metoCLOPramide (REGLAN) injection 10 mg (10 mg Intravenous Given 05/21/22 0008)  diphenhydrAMINE (BENADRYL) injection 25 mg (25 mg Intravenous Given 05/21/22 0007)    ED Course/ Medical Decision Making/ A&P                           Medical Decision Making Amount and/or Complexity of Data Reviewed Labs: ordered. Radiology: ordered.  Risk Prescription drug management.   Alert 56 year old female in no acute distress, nontoxic-appearing.  Presents emerged department complaint of seizures.  Information obtained from patient, patient's friend, and EMS.  I personally viewed and interpreted patient's past medical records including previous bladder notes, labs, and imaging.  Patient has medical history as outlined in HPI which complicates her care.  Due to reports of seizure we will check CBG, and basic lab work.  Due to patient having history of malignancy will obtain noncontrast head CT to for possible metastasis.  Patient reports headache and therefore will be given Reglan.  I personally viewed and interpreted patient CT imaging.  Agree with radiology interpretation of no acute intracranial abnormality.  I personally viewed and interpreted EKG.  Tracing shows sinus rhythm.  I personally viewed and interpreted patient's lab results.  Tracing shows: -CBG 126 -BMP and CBC unremarkable -Keppra and Lamictal levels pending  On serial reexamination patient has no focal neurological deficits.  Patient has no seizures while in the emergency department.  Patient back to normal baseline mental status.  Patient reports improvement in headache after receiving Reglan.  Will discharge patient to follow-up with her neurologist at this time.  Based on patient's chief complaint, I considered admission might be necessary, however after  reassuring ED workup feel patient is reasonable for discharge.  Discussed results, findings, treatment and follow up. Patient advised of return precautions. Patient verbalized understanding and agreed with plan.  Portions of this note were generated with [employer]. Dictation errors may occur despite best attempts at proofreading.         Final Clinical Impression(s) / ED Diagnoses Final diagnoses:  None    Rx / DC Orders ED Discharge Orders     None  Dyann Ruddle 05/21/22 0121    Luna Fuse, MD 06/03/22 [phone]

## 2022-05-20 NOTE — ED Triage Notes (Signed)
[facility] for seizures w/ hx same. Pt reports intermittent seizure activity since approx 1930 tonight. Last was approx 8mn tonic-clonic witnessed by ems and treated w/ 2.'5mg'$  iv versed. Took home keppra and xanax prior to ems arrival. Per ems gaze was deviated to the right. Denies recent illness or injury.

## 2022-05-21 ENCOUNTER — Emergency Department (HOSPITAL_COMMUNITY): Payer: 59

## 2022-05-21 DIAGNOSIS — R569 Unspecified convulsions: Secondary | ICD-10-CM | POA: Diagnosis not present

## 2022-05-21 MED ORDER — METOCLOPRAMIDE HCL 5 MG/ML IJ SOLN
10.0000 mg | Freq: Once | INTRAMUSCULAR | Status: AC
  Administered 2022-05-21: 10 mg via INTRAVENOUS
  Filled 2022-05-21: qty 2

## 2022-05-21 MED ORDER — DIPHENHYDRAMINE HCL 50 MG/ML IJ SOLN
25.0000 mg | Freq: Once | INTRAMUSCULAR | Status: AC
  Administered 2022-05-21: 25 mg via INTRAVENOUS
  Filled 2022-05-21: qty 1

## 2022-05-21 NOTE — Discharge Instructions (Signed)
You came to the emergency department today to be evaluated for your seizure.  Your physical exam, lab results, and CT imaging were reassuring.  Please continue to take your seizure medication as prescribed.  Please follow-up closely with your neurologist for reevaluation.  Get help right away if: You have: A seizure that does not stop after 5 minutes. Several seizures in a row without a complete recovery between seizures. A seizure that makes it harder to breathe. A seizure that leaves you unable to speak or use a part of your body. You do not wake up right away after a seizure. You injure yourself during a seizure. You have confusion or pain right after a seizure.

## 2022-05-22 ENCOUNTER — Ambulatory Visit (INDEPENDENT_AMBULATORY_CARE_PROVIDER_SITE_OTHER): Payer: 59 | Admitting: Neurology

## 2022-05-22 ENCOUNTER — Encounter: Payer: Self-pay | Admitting: Neurology

## 2022-05-22 ENCOUNTER — Other Ambulatory Visit (HOSPITAL_COMMUNITY): Payer: Self-pay

## 2022-05-22 VITALS — BP 134/86 | HR 76 | Ht 67.0 in | Wt 158.0 lb

## 2022-05-22 DIAGNOSIS — G40009 Localization-related (focal) (partial) idiopathic epilepsy and epileptic syndromes with seizures of localized onset, not intractable, without status epilepticus: Secondary | ICD-10-CM | POA: Diagnosis not present

## 2022-05-22 DIAGNOSIS — C538 Malignant neoplasm of overlapping sites of cervix uteri: Secondary | ICD-10-CM | POA: Diagnosis not present

## 2022-05-22 LAB — LEVETIRACETAM LEVEL: Levetiracetam Lvl: 25.2 ug/mL (ref 10.0–40.0)

## 2022-05-22 LAB — LAMOTRIGINE LEVEL: Lamotrigine Lvl: 2.2 ug/mL (ref 2.0–20.0)

## 2022-05-22 MED ORDER — NAYZILAM 5 MG/0.1ML NA SOLN
5.0000 mg | NASAL | 2 refills | Status: DC | PRN
  Filled 2022-05-22: qty 2, 5d supply, fill #0
  Filled 2022-09-01: qty 2, 5d supply, fill #1

## 2022-05-22 MED ORDER — LAMOTRIGINE 25 MG PO TABS
75.0000 mg | ORAL_TABLET | Freq: Two times a day (BID) | ORAL | 1 refills | Status: DC
  Filled 2022-05-22: qty 180, 30d supply, fill #0
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
## 2022-05-22 NOTE — Patient Instructions (Signed)
Epilepsy Epilepsy is when a person keeps having seizures. A seizure is a burst of abnormal activity in the brain. This condition can cause problems such as: A change in how you think or behave. Trouble knowing what is happening. Falls, accidents, and injury. Sadness (depression). Poor memory. In rare cases, this condition can be life-threatening. But most people with epilepsy lead normal lives. What are the causes? A head injury or an injury that happens at birth. A high fever during childhood. A stroke. Bleeding into or around the brain. Some medicines and drugs. Having too little oxygen for a long time. Abnormal brain development. Conditions such as: Brain infection. Brain tumors. Conditions that are passed from parent to child. Many times, the cause is not known. What are the signs or symptoms? Symptoms of a seizure vary from person to person. They may include: Symptoms during a seizure Shaking with fast, jerky movements of muscles (convulsions). Stiffness of the body. Breathing problems. Being mixed up (confused). Staring or being hard to wake up (being unresponsive). Head nodding, eye blinking, eye twitching, or fast eye movements. Drooling, grunting, or making clicking sounds with your mouth. Not being able to control when you pee or poop. Symptoms before a seizure Feeling afraid, worried, or nervous. Feeling like you may vomit. Vertigo. This feels like: You are moving when you are not. Things around you are moving when they are not. Dj vu. This is a feeling of having seen or heard something before. Odd tastes or smells. Changes in how you see, such as seeing flashing lights or spots. Symptoms after a seizure Being confused. Being sleepy. A headache. Sore muscles. How is this treated? Treatment can control seizures. It may include: Taking medicines. Having a device put in the chest (vagus nerve stimulator). Brain surgery. Having blood tests often. Eating  foods that are low in carbohydrates and high in fat (ketogenic diet). If you are diagnosed with this condition, you should start treatment as soon as you can. Follow these instructions at home: Medicines Take over-the-counter and prescription medicines only as told by your doctor. Avoid anything that may keep your medicine from working, such as alcohol. Activity Get enough rest. Follow your doctor's advice about driving, swimming, and doing other things that would be dangerous if you had a seizure. If you live in the [place] ask your local department of motor vehicles about local driving laws for people with epilepsy. Teaching others  Teach friends and family what to do if you have a seizure. Tell them to: Help you get down to the ground. Put a pillow under your head and body. Loosen any clothing around your neck. Turn you on your side. Stay with you until you are better. Know whether or not you need emergency care. Also, tell them what not to do if you have a seizure. Tell them: They should not hold you down. They should not put anything in your mouth. General instructions Avoid things that cause you to have seizures. Keep a seizure diary. Write down: What you remember about each seizure. What might have caused the seizure. Keep all follow-up visits. Where to find more information Epilepsy Foundation: epilepsy.com International League Against Epilepsy: ilae.org Contact a doctor if: You have a change in how often or when you have seizures. You get an infection or start to feel sick. You are not able to take your medicine. Get help right away if: A seizure does not stop after 5 minutes. You have more than one seizure in a  row, and you do not have enough time between the seizures to feel better. A seizure makes it harder to breathe. A seizure is different from other seizures you have had. A seizure makes you unable to speak or use a part of your body. You did not wake up right  away after a seizure. You feel sad, and this does not get better. These symptoms may be an emergency. Get help right away. Call your local emergency services (911 in the [place] Do not wait to see if the symptoms will go away. Do not drive yourself to the hospital. Get help right awayif you feel like you may hurt yourself or others, or have thoughts about taking your own life. Go to your nearest emergency room or: Call your local emergency services (911 in the [place] Call the [facility] at (901)100-9527 or 988 in the [place] This is open 24 hours a day. Text the Crisis Text Line at (701)187-1634. Summary Epilepsy is when a person keeps having seizures. Seizures can cause many symptoms, such as brief staring and shaking or jerky muscle movements. Treatment can control seizures. Take over-the-counter and prescription medicines only as told by your doctor. Follow your doctor's advice about driving, swimming, and doing other things that would be dangerous if you had a seizure. Teach friends and family what to do if you have a seizure. This information is not intended to replace advice given to you by your health care provider. Make sure you discuss any questions you have with your health care provider. Document Revised: 05/08/2021 Document Reviewed: 04/16/2020 Elsevier Patient Education  [place].

## 2022-05-23 ENCOUNTER — Other Ambulatory Visit (HOSPITAL_COMMUNITY): Payer: Self-pay

## 2022-05-23 ENCOUNTER — Other Ambulatory Visit: Payer: Self-pay | Admitting: Internal Medicine

## 2022-05-23 LAB — LAMOTRIGINE LEVEL: Lamotrigine Lvl: 2.7 ug/mL (ref 2.0–20.0)

## 2022-05-23 MED ORDER — LEVOTHYROXINE SODIUM 50 MCG PO TABS
50.0000 ug | ORAL_TABLET | Freq: Every day | ORAL | 0 refills | Status: DC
  Filled 2022-05-23: qty 90, 90d supply, fill #0
  Filled 2022-05-27: qty 30, 30d supply, fill #0
  Filled 2022-07-11: qty 30, 30d supply, fill #1
  Filled 2022-08-06: qty 30, 30d supply, fill #2

## 2022-05-25 NOTE — Progress Notes (Signed)
Low normal blood level of Lamictal. We are increasing the dose from 50 mg bid to 75 mg bid( bid= twice a day) for 30 days, then 100 mg bid.

## 2022-05-26 ENCOUNTER — Telehealth: Payer: Self-pay | Admitting: Neurology

## 2022-05-26 ENCOUNTER — Other Ambulatory Visit (HOSPITAL_COMMUNITY): Payer: Self-pay

## 2022-05-26 MED ORDER — LAMOTRIGINE 100 MG PO TABS
100.0000 mg | ORAL_TABLET | Freq: Two times a day (BID) | ORAL | 1 refills | Status: DC
  Filled 2022-05-26: qty 60, 30d supply, fill #0
  Filled 2022-09-01: qty 60, 30d supply, fill #1
  Filled 2022-10-04: qty 60, 30d supply, fill #2
  Filled 2022-11-01: qty 60, 30d supply, fill #3
  Filled 2022-11-30: qty 60, 30d supply, fill #4
  Filled 2023-01-20: qty 60, 30d supply, fill #5

## 2022-05-26 NOTE — Telephone Encounter (Signed)
[employer] sent to [facility] they obtain auth 

## 2022-05-26 NOTE — Telephone Encounter (Signed)
-----   Message from Larey Seat, MD sent at 05/25/2022  4:07 PM EDT ----- Low normal blood level of Lamictal. We are increasing the dose from 50 mg bid to 75 mg bid( bid= twice a day) for 30 days, then 100 mg bid.

## 2022-05-26 NOTE — Telephone Encounter (Signed)
Called the pt and reviewed the lab results with her. She has been taking the 75 mg BID since 05/23/22. She has not picked up the script from the pharmacy yet. Advised she should only pick up a months worth since as long as tolerated she will increase to 100 mg BID. I will forward the 100 mg BID dose to pharmacy to have on file and fill around 06/23/2022.

## 2022-05-27 ENCOUNTER — Other Ambulatory Visit: Payer: Self-pay

## 2022-05-27 ENCOUNTER — Ambulatory Visit
Admission: RE | Admit: 2022-05-27 | Discharge: 2022-05-27 | Disposition: A | Discharge status: Home / Self Care | Payer: 59 | Source: Ambulatory Visit | Attending: Neurology | Admitting: Neurology

## 2022-05-27 ENCOUNTER — Other Ambulatory Visit (HOSPITAL_COMMUNITY): Payer: Self-pay

## 2022-05-27 ENCOUNTER — Telehealth: Payer: Self-pay | Admitting: Neurology

## 2022-05-27 DIAGNOSIS — C538 Malignant neoplasm of overlapping sites of cervix uteri: Secondary | ICD-10-CM

## 2022-05-27 DIAGNOSIS — G40009 Localization-related (focal) (partial) idiopathic epilepsy and epileptic syndromes with seizures of localized onset, not intractable, without status epilepticus: Secondary | ICD-10-CM | POA: Diagnosis not present

## 2022-05-27 MED ORDER — ZOLPIDEM TARTRATE 10 MG PO TABS
10.0000 mg | ORAL_TABLET | Freq: Every evening | ORAL | 1 refills | Status: DC | PRN
  Filled 2022-05-27: qty 30, 30d supply, fill #0
  Filled 2022-06-23 – 2022-07-03 (×2): qty 30, 30d supply, fill #1

## 2022-05-27 MED ORDER — GADOBENATE DIMEGLUMINE 529 MG/ML IV SOLN
13.0000 mL | Freq: Once | INTRAVENOUS | Status: AC | PRN
  Administered 2022-05-27: 13 mL via INTRAVENOUS

## 2022-05-27 NOTE — Telephone Encounter (Signed)
PA completed on [employer] KEY: [id number] PA approved immediately

## 2022-05-28 NOTE — Telephone Encounter (Signed)
Approved until 05/27/2023

## 2022-05-29 ENCOUNTER — Encounter: Payer: Self-pay | Admitting: Neurology

## 2022-05-30 ENCOUNTER — Other Ambulatory Visit (HOSPITAL_COMMUNITY): Payer: Self-pay

## 2022-06-04 ENCOUNTER — Other Ambulatory Visit (HOSPITAL_COMMUNITY): Payer: Self-pay

## 2022-06-20 ENCOUNTER — Other Ambulatory Visit (HOSPITAL_COMMUNITY): Payer: Self-pay

## 2022-06-23 ENCOUNTER — Other Ambulatory Visit (HOSPITAL_COMMUNITY): Payer: Self-pay

## 2022-06-24 ENCOUNTER — Other Ambulatory Visit (HOSPITAL_COMMUNITY): Payer: Self-pay

## 2022-07-02 ENCOUNTER — Other Ambulatory Visit (HOSPITAL_COMMUNITY): Payer: Self-pay

## 2022-07-03 ENCOUNTER — Other Ambulatory Visit (HOSPITAL_COMMUNITY): Payer: Self-pay

## 2022-07-03 ENCOUNTER — Other Ambulatory Visit: Payer: 59

## 2022-07-03 DIAGNOSIS — E039 Hypothyroidism, unspecified: Secondary | ICD-10-CM

## 2022-07-03 DIAGNOSIS — R7989 Other specified abnormal findings of blood chemistry: Secondary | ICD-10-CM

## 2022-07-03 DIAGNOSIS — F419 Anxiety disorder, unspecified: Secondary | ICD-10-CM

## 2022-07-03 DIAGNOSIS — E7800 Pure hypercholesterolemia, unspecified: Secondary | ICD-10-CM | POA: Diagnosis not present

## 2022-07-03 DIAGNOSIS — R69 Illness, unspecified: Secondary | ICD-10-CM | POA: Diagnosis not present

## 2022-07-04 ENCOUNTER — Encounter: Payer: Self-pay | Admitting: Internal Medicine

## 2022-07-04 ENCOUNTER — Ambulatory Visit (INDEPENDENT_AMBULATORY_CARE_PROVIDER_SITE_OTHER): Payer: 59 | Admitting: Internal Medicine

## 2022-07-04 ENCOUNTER — Other Ambulatory Visit: Payer: Self-pay

## 2022-07-04 ENCOUNTER — Other Ambulatory Visit (HOSPITAL_COMMUNITY): Payer: Self-pay

## 2022-07-04 VITALS — BP 108/80 | HR 75 | Temp 98.3°F | Ht 67.75 in | Wt 165.8 lb

## 2022-07-04 DIAGNOSIS — F419 Anxiety disorder, unspecified: Secondary | ICD-10-CM

## 2022-07-04 DIAGNOSIS — R7989 Other specified abnormal findings of blood chemistry: Secondary | ICD-10-CM | POA: Diagnosis not present

## 2022-07-04 DIAGNOSIS — E039 Hypothyroidism, unspecified: Secondary | ICD-10-CM

## 2022-07-04 DIAGNOSIS — F439 Reaction to severe stress, unspecified: Secondary | ICD-10-CM

## 2022-07-04 DIAGNOSIS — E7800 Pure hypercholesterolemia, unspecified: Secondary | ICD-10-CM

## 2022-07-04 DIAGNOSIS — F5101 Primary insomnia: Secondary | ICD-10-CM

## 2022-07-04 DIAGNOSIS — G40909 Epilepsy, unspecified, not intractable, without status epilepticus: Secondary | ICD-10-CM

## 2022-07-04 DIAGNOSIS — F32A Depression, unspecified: Secondary | ICD-10-CM

## 2022-07-04 DIAGNOSIS — R69 Illness, unspecified: Secondary | ICD-10-CM | POA: Diagnosis not present

## 2022-07-04 DIAGNOSIS — C539 Malignant neoplasm of cervix uteri, unspecified: Secondary | ICD-10-CM

## 2022-07-04 DIAGNOSIS — Z0000 Encounter for general adult medical examination without abnormal findings: Secondary | ICD-10-CM | POA: Diagnosis not present

## 2022-07-04 LAB — CBC WITH DIFFERENTIAL/PLATELET
Absolute Monocytes: 407 cells/uL (ref 200–950)
Basophils Absolute: 19 cells/uL (ref 0–200)
Basophils Relative: 0.5 %
Eosinophils Absolute: 80 cells/uL (ref 15–500)
Eosinophils Relative: 2.1 %
HCT: 39.3 % (ref 35.0–45.0)
Hemoglobin: 13.4 g/dL (ref 11.7–15.5)
Lymphs Abs: 1011 cells/uL (ref 850–3900)
MCH: 33.9 pg — ABNORMAL HIGH (ref 27.0–33.0)
MCHC: 34.1 g/dL (ref 32.0–36.0)
MCV: 99.5 fL (ref 80.0–100.0)
MPV: 9.9 fL (ref 7.5–12.5)
Monocytes Relative: 10.7 %
Neutro Abs: 2284 cells/uL (ref 1500–7800)
Neutrophils Relative %: 60.1 %
Platelets: 207 10*3/uL (ref 140–400)
RBC: 3.95 10*6/uL (ref 3.80–5.10)
RDW: 11 % (ref 11.0–15.0)
Total Lymphocyte: 26.6 %
WBC: 3.8 10*3/uL (ref 3.8–10.8)

## 2022-07-04 LAB — COMPLETE METABOLIC PANEL WITH GFR
AG Ratio: 2.2 (calc) (ref 1.0–2.5)
ALT: 12 U/L (ref 6–29)
AST: 18 U/L (ref 10–35)
Albumin: 4.3 g/dL (ref 3.6–5.1)
Alkaline phosphatase (APISO): 67 U/L (ref 37–153)
BUN: 12 mg/dL (ref 7–25)
CO2: 31 mmol/L (ref 20–32)
Calcium: 9.8 mg/dL (ref 8.6–10.4)
Chloride: 106 mmol/L (ref 98–110)
Creat: 0.85 mg/dL (ref 0.50–1.03)
Globulin: 2 g/dL (calc) (ref 1.9–3.7)
Glucose, Bld: 88 mg/dL (ref 65–99)
Potassium: 5.1 mmol/L (ref 3.5–5.3)
Sodium: 142 mmol/L (ref 135–146)
Total Bilirubin: 0.4 mg/dL (ref 0.2–1.2)
Total Protein: 6.3 g/dL (ref 6.1–8.1)
eGFR: 79 mL/min/{1.73_m2} (ref >=60)

## 2022-07-04 LAB — LIPID PANEL
Cholesterol: 253 mg/dL — ABNORMAL HIGH (ref <200)
HDL: 81 mg/dL (ref >=50)
LDL Cholesterol (Calc): 153 mg/dL (calc) — ABNORMAL HIGH
Non-HDL Cholesterol (Calc): 172 mg/dL (calc) — ABNORMAL HIGH (ref <130)
Total CHOL/HDL Ratio: 3.1 (calc) (ref <5.0)
Triglycerides: 83 mg/dL (ref <150)

## 2022-07-04 LAB — TSH: TSH: 2.33 mIU/L (ref 0.40–4.50)

## 2022-07-04 MED ORDER — ROSUVASTATIN CALCIUM 10 MG PO TABS
10.0000 mg | ORAL_TABLET | Freq: Every day | ORAL | 3 refills | Status: DC
  Filled 2022-07-04: qty 30, 30d supply, fill #0
  Filled 2022-08-06: qty 30, 30d supply, fill #1
  Filled 2022-09-01: qty 30, 30d supply, fill #2
  Filled 2022-10-04: qty 30, 30d supply, fill #3
  Filled 2022-11-01: qty 30, 30d supply, fill #4
  Filled 2022-11-30: qty 30, 30d supply, fill #5
  Filled 2023-01-20: qty 30, 30d supply, fill #6
  Filled 2023-02-12 – 2023-02-13 (×2): qty 30, 30d supply, fill #7
  Filled 2023-03-11: qty 30, 30d supply, fill #8
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
## 2022-07-10 ENCOUNTER — Other Ambulatory Visit: Payer: Self-pay | Admitting: Internal Medicine

## 2022-07-11 ENCOUNTER — Other Ambulatory Visit (HOSPITAL_COMMUNITY): Payer: Self-pay

## 2022-07-27 NOTE — Patient Instructions (Addendum)
Patient will continue close follow-up with GYN and Neurology.  I will see her again in 6 months to follow-up on hyperlipidemia.  Had suggested counseling for situational stress.

## 2022-07-28 ENCOUNTER — Other Ambulatory Visit: Payer: Self-pay | Admitting: Internal Medicine

## 2022-07-28 DIAGNOSIS — Z1231 Encounter for screening mammogram for malignant neoplasm of breast: Secondary | ICD-10-CM

## 2022-07-31 ENCOUNTER — Ambulatory Visit: Payer: 59

## 2022-08-01 ENCOUNTER — Other Ambulatory Visit (HOSPITAL_COMMUNITY): Payer: Self-pay

## 2022-08-06 ENCOUNTER — Other Ambulatory Visit (HOSPITAL_COMMUNITY): Payer: Self-pay

## 2022-08-06 DIAGNOSIS — G40A11 Absence epileptic syndrome, intractable, with status epilepticus: Secondary | ICD-10-CM | POA: Diagnosis not present

## 2022-08-06 DIAGNOSIS — F419 Anxiety disorder, unspecified: Secondary | ICD-10-CM | POA: Diagnosis not present

## 2022-08-06 DIAGNOSIS — R69 Illness, unspecified: Secondary | ICD-10-CM | POA: Diagnosis not present

## 2022-08-06 MED ORDER — ZOLPIDEM TARTRATE 10 MG PO TABS
10.0000 mg | ORAL_TABLET | Freq: Every evening | ORAL | 5 refills | Status: DC
  Filled 2022-08-06: qty 30, 30d supply, fill #0

## 2022-08-06 MED ORDER — FLUOXETINE HCL 20 MG PO CAPS
60.0000 mg | ORAL_CAPSULE | Freq: Every morning | ORAL | 5 refills | Status: DC
  Filled 2022-08-06: qty 90, 30d supply, fill #0
  Filled 2022-09-01: qty 90, 30d supply, fill #1
  Filled 2022-11-30: qty 90, 30d supply, fill #2
  Filled 2023-01-20: qty 90, 30d supply, fill #3
  Filled 2023-03-11: qty 90, 30d supply, fill #4
  Filled 2023-04-05: qty 90, 30d supply, fill #5

## 2022-08-06 MED ORDER — ALPRAZOLAM 0.5 MG PO TABS
0.5000 mg | ORAL_TABLET | Freq: Every day | ORAL | 5 refills | Status: DC | PRN
  Filled 2022-08-06: qty 30, 30d supply, fill #0

## 2022-08-07 ENCOUNTER — Other Ambulatory Visit (HOSPITAL_COMMUNITY): Payer: Self-pay

## 2022-08-13 ENCOUNTER — Telehealth: Payer: Self-pay

## 2022-08-13 NOTE — Telephone Encounter (Signed)
Patient wants GI referral for colonoscopy

## 2022-08-14 ENCOUNTER — Other Ambulatory Visit: Payer: Self-pay | Admitting: Neurology

## 2022-08-21 ENCOUNTER — Telehealth: Payer: Self-pay

## 2022-08-21 ENCOUNTER — Encounter: Payer: Self-pay | Admitting: Internal Medicine

## 2022-08-21 ENCOUNTER — Ambulatory Visit: Payer: 59 | Admitting: Adult Health

## 2022-08-21 NOTE — Progress Notes (Deleted)
[facility] [address] [place]. [place] [address] [phone]       OFFICE FOLLOW UP NOTE  [name] Date of Birth:  1966/01/12 Medical Record Number:  [id number]    Primary neurologist: Dr. Brett Hammond Reason for visit: Seizures    SUBJECTIVE:   CHIEF COMPLAINT:  No chief complaint on file.   HPI:   Update 08/21/2022 JM: Patient returns for seizure follow-up visit after prior visit with Dr. Brett Hammond 3 months ago.  She has been doing well.  She has not had any additional seizure activity.  She remains on Keppra 500 mg twice daily and lamotrigine 100 mg twice daily.  Denies side effects.  Completed MRI which was unremarkable and no changes since prior MRI in 06/2021.        History from Dr. Edwena Hammond prior [facility] note provided for reference purposes only Ashley Hammond is a 56 y.o. female seen in a RV on 05-22-2022:  The patient reports today more olfactory and gustatory auras. Hospital she reports that she was traveling to [facility], where she was physically very active.  Upon her return to the [place] area she felt a prolonged and severe fatigue.  Maybe this was a mixture of altitude exposure and times on changes however while she was in [place] she had felt well and the fatigue only became evident to her head after her return here.  There was one occurrence on 05-20-2022 when she needed the EMS to be called-  she felt a liitle  hypoglycemic, nauseated and ate some chicken- she had olfactory auras-  and at midday started some watering of container plants on her patio- gardening and  noticed ongoing olfactory and gustatory changes, that smell is for her associated with a seizure aura and a friend had been with her who witnessed how she behaved and acted.   She states that she did not lose awareness of her surroundings and she remembers when her friends talk to her and what she said.  However she felt not able to physically respond  adequately.  She was clammy and she felt shaky- she sat indoors for a while, rocking back and forth and moaning- but she fought nausea and finally EMS arrived. She went to the ED where she vomited. She doesn't remember the ambulance ride !  CT again was negative. She was not poorly hydrated. She stated she was partially aware , rocking, not tonic -clonic . "Trying to switch out of it ".    Here is ED report: Ashley Hammond is a 56 y.o. 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 Brett Hammond with [facility]), hypertension, hyperlipidemia, anxiety, malignancy of cervix status post surgery atrial fibrillation in 1 discharge.   Presents emergency department complaint of seizures.  Patient reports that she had a seizure earlier today but is unsure what time that was.  Patient reports that she has been getting a "horrible smell all day," which is usually precursor for her seizures.  Patient reports compliance with her Keppra and Lamictal medications.  Patient reports prior to today her last seizure was 1 month ago.   EMS reports that they were told that patient has been having multiple seizures throughout the day.  Patient had a seizure witnessed with EMS.  Seizure lasted for approximately 30 minutes  (???) and was full body tonic-clonic.  Patient was given 2.5  mg of IV Versed.   Patient's friend reports that she came over at 7:00pm.  She states that patient did not have any seizures prior to EMS arrival.  Patient not suffer any falls or traumatic injuries.     ROS:   14 system review of systems performed and negative with exception of ***  PMH:  Past Medical History:  Diagnosis Date   Anxiety    Arthritis    Arthritis of ankle    Right   Cervical cancer ([place])    Depression    Episode of loss of consciousness    History of radiation therapy    Pelvis  07/31/2021-09/03/2021  HDR tandem and ring 09/26/2021  Dr Ashley Hammond   Hypothyroidism    Insomnia    Pure hypercholesterolemia    Seizure ([place])     [place]:  Past Surgical History:  Procedure Laterality Date   ANKLE SURGERY Right    [facility]   IR IMAGING GUIDED PORT INSERTION  07/30/2021   IR REMOVAL TUN ACCESS W/ PORT W/O FL MOD SED  01/03/2022   OPERATIVE ULTRASOUND N/A 09/26/2021   Procedure: OPERATIVE ULTRASOUND;  Surgeon: Ashley Pray, MD;  Location: [facility];  Service: Urology;  Laterality: N/A;   ROBOTIC ASSISTED TOTAL HYSTERECTOMY WITH BILATERAL SALPINGO OOPHERECTOMY Bilateral 10/24/2021   Procedure: XI ROBOTIC ASSISTED TOTAL HYSTERECTOMY WITH BILATERAL SALPINGO OOPHORECTOMY;  Surgeon: Ashley Mosses, MD;  Location: [facility];  Service: Gynecology;  Laterality: Bilateral;   TANDEM RING INSERTION N/A 09/26/2021   Procedure: TANDEM RING INSERTION;  Surgeon: Ashley Pray, MD;  Location: [facility];  Service: Urology;  Laterality: N/A;   WISDOM TOOTH EXTRACTION      Social History:  Social History   Socioeconomic History   Marital status: Married    Spouse name: Not on file   Number of children: 2   Years of education: Not on file   Highest education level: Not on file  Occupational History   Not on file  Tobacco Use   Smoking status: Former    Packs/day: 0.25    Years: 25.00    Total pack years: 6.25    Types: Cigarettes    Quit date: 2016    Years since quitting: 7.8   Smokeless tobacco: Never   Tobacco comments:    20+ yrs  Vaping Use   Vaping Use: Never used  Substance and Sexual Activity   Alcohol use: Yes    Comment: 14 drinks a week of wine   Drug use: Yes    Frequency: 7.0 times per week    Types: Marijuana    Comment: Last use 07-10-21   Sexual activity: Not Currently    Birth control/protection: Post-menopausal  Other Topics Concern   Not on file  Social History Narrative   Lives alone   R handed   Caffeine: 1 C of coffee a day   Social Determinants of  Radio broadcast assistant Strain: Not on file  Food Insecurity: Not on file  Transportation Needs: Not on file  Physical Activity: Not on file  Stress: Not on file  Social Connections: Not on file  Intimate Partner Violence: Not on file    Family History:  Family History  Problem Relation Age of Onset   Heart attack Father    Diabetes Brother    Pancreatic cancer Maternal Grandmother    Ovarian cancer Other    Endometrial cancer Other    Colon cancer Other    Prostate cancer Other  Pancreatic cancer Other    Sudden death Neg Hx    Hyperlipidemia Neg Hx    Hypertension Neg Hx    Breast cancer Neg Hx     Medications:   Current Outpatient Medications on File Prior to Visit  Medication Sig Dispense Refill   ALPRAZolam (XANAX) 0.5 MG tablet TAKE ONE TABLET BY MOUTH  AT BEDTIME AS NEEDED FOR ANXIETY 90 tablet 1   ALPRAZolam (XANAX) 0.5 MG tablet Take 1 tablet (0.5 mg total) by mouth daily as needed for anxiety 30 tablet 5   Bioflavonoid Products (ESTER-C) TABS Take 1 tablet by mouth daily.     Brimonidine Tartrate (LUMIFY) 0.025 % SOLN Place 1 drop into both eyes daily as needed (red eyes).     diphenoxylate-atropine (LOMOTIL) 2.5-0.025 MG tablet Take 1 tablet by mouth 4 (four) times daily as needed for diarrhea or loose stools. 30 tablet 0   FLUoxetine (PROZAC) 10 MG capsule TAKE ONE CAPSULE BY MOUTH DAILY 90 capsule 1   FLUoxetine (PROZAC) 20 MG capsule Take 3 capsules (60 mg total) by mouth every morning. 90 capsule 5   guaiFENesin (ROBITUSSIN) 100 MG/5ML liquid Take 5 mLs by mouth every 4 (four) hours as needed for cough or to loosen phlegm.     ibuprofen (ADVIL) 800 MG tablet Take 1 tablet by mouth every 8 hours as needed for moderate pain (after surgery only) 30 tablet 1   lamoTRIgine (LAMICTAL) 100 MG tablet Take 1 tablet (100 mg total) by mouth 2 (two) times daily. 180 tablet 1   lamoTRIgine (LAMICTAL) 25 MG tablet TAKE ONE TABLET BY MOUTH TWICE A DAY 180 tablet 0    levETIRAcetam (KEPPRA) 500 MG tablet TAKE ONE TABLET BY MOUTH TWICE A DAY 180 tablet 1   levothyroxine (SYNTHROID) 50 MCG tablet Take 1 tablet by mouth daily before breakfast. 90 tablet 0   loperamide (IMODIUM) 2 MG capsule Take 2 mg by mouth as needed for diarrhea or loose stools.     Magnesium 400 MG CAPS Take 400 mg by mouth daily.     Midazolam (NAYZILAM) 5 MG/0.1ML SOLN Place 5 mg into the nose as needed. 2 each 2   oxyCODONE (OXY IR/ROXICODONE) 5 MG immediate release tablet Take 1 tablet by mouth every 4 hours as needed for severe pain. (for AFTER surgery only) -- do not take and drive (Patient not taking: Reported on 07/04/2022) 15 tablet 0   rosuvastatin (CRESTOR) 10 MG tablet Take 1 tablet (10 mg total) by mouth daily. Please make overdue appt with Dr. Johney Frame before anymore refills. Thank you 1st attempt 90 tablet 3   senna-docusate (SENOKOT-S) 8.6-50 MG tablet Take 2 tablets by mouth at bedtime. For AFTER surgery, do not take if having diarrhea 30 tablet 0   zinc gluconate 50 MG tablet Take 50 mg by mouth daily. (Patient not taking: Reported on 07/04/2022)     zolpidem (AMBIEN) 10 MG tablet Take 1 tablet (10 mg total) by mouth at bedtime as needed. 30 tablet 1   zolpidem (AMBIEN) 10 MG tablet Take 1 tablet (10 mg total) by mouth Nightly as needed 30 tablet 5   [DISCONTINUED] prochlorperazine (COMPAZINE) 10 MG tablet Take 1 tablet (10 mg total) by mouth every 6 (six) hours as needed for nausea or vomiting. (Patient not taking: Reported on 11/18/2021) 30 tablet 1   No current facility-administered medications on file prior to visit.    Allergies:  No Known Allergies    OBJECTIVE:  Physical Exam  There were no vitals filed for this visit. There is no height or weight on file to calculate BMI. No results found.   General: well developed, well nourished, seated, in no evident distress Head: head normocephalic and atraumatic.   Neck: supple with no carotid or supraclavicular  bruits Cardiovascular: regular rate and rhythm, no murmurs Musculoskeletal: no deformity Skin:  no rash/petichiae Vascular:  Normal pulses all extremities   Neurologic Exam Mental Status: Awake and fully alert. Oriented to place and time. Recent and remote memory intact. Attention span, concentration and fund of knowledge appropriate. Mood and affect appropriate.  Cranial Nerves: Pupils equal, briskly reactive to light. Extraocular movements full without nystagmus. Visual fields full to confrontation. Hearing intact. Facial sensation intact. Face, tongue, palate moves normally and symmetrically.  Motor: Normal bulk and tone. Normal strength in all tested extremity muscles Sensory.: intact to touch , pinprick , position and vibratory sensation.  Coordination: Rapid alternating movements normal in all extremities. Finger-to-nose and heel-to-shin performed accurately bilaterally. Gait and Station: Arises from chair without difficulty. Stance is normal. Gait demonstrates normal stride length and balance without use of AD. Tandem walk and heel toe without difficulty.  Reflexes: 1+ and symmetric. Toes downgoing.         ASSESSMENT/PLAN: Ashley Hammond is a 56 y.o. year old female   1.  Seizures  -Continue Keppra 500 mg twice daily  -Continue lamotrigine 100 mg twice daily  -Check lab work today        Follow up in *** or call earlier if needed   CC:  PCP: [name], Cresenciano Lick, MD    I spent *** minutes of face-to-face and non-face-to-face time with patient.  This included previsit chart review, lab review, study review, order entry, electronic health record documentation, patient education regarding ***   Frann Rider, AGNP-BC  [facility] [address] [place], [place] [address]  Phone [phone] Fax [phone] Note: This document was prepared with digital dictation and possible smart phrase technology. Any transcriptional errors that  result from this process are unintentional.

## 2022-08-21 NOTE — Telephone Encounter (Signed)
Called patient and left message to see about rescheduling her appointment on 11/3.  Need to move from 2:30pm to 9:30am.

## 2022-08-26 ENCOUNTER — Telehealth: Payer: Self-pay | Admitting: *Deleted

## 2022-08-26 ENCOUNTER — Ambulatory Visit: Payer: 59 | Admitting: Adult Health

## 2022-08-26 ENCOUNTER — Ambulatory Visit
Admission: RE | Admit: 2022-08-26 | Discharge: 2022-08-26 | Disposition: A | Discharge status: Home / Self Care | Payer: 59 | Source: Ambulatory Visit | Attending: Internal Medicine | Admitting: Internal Medicine

## 2022-08-26 DIAGNOSIS — Z1231 Encounter for screening mammogram for malignant neoplasm of breast: Secondary | ICD-10-CM

## 2022-08-26 NOTE — Telephone Encounter (Signed)
Ashley Hammond,   This pt is a documented difficult intubation and her procedure will need to be done at the hospital.    Thanks,  Osvaldo Angst

## 2022-08-28 ENCOUNTER — Encounter: Payer: Self-pay | Admitting: Gynecologic Oncology

## 2022-08-28 NOTE — Telephone Encounter (Signed)
I have called patient and left message to call back to reschedule for an office visit.

## 2022-08-29 ENCOUNTER — Inpatient Hospital Stay: Payer: 59 | Attending: Gynecologic Oncology | Admitting: Gynecologic Oncology

## 2022-08-29 ENCOUNTER — Encounter: Payer: Self-pay | Admitting: Gynecologic Oncology

## 2022-08-29 ENCOUNTER — Other Ambulatory Visit (HOSPITAL_COMMUNITY)
Admission: RE | Admit: 2022-08-29 | Discharge: 2022-08-29 | Disposition: A | Discharge status: Home / Self Care | Payer: 59 | Source: Ambulatory Visit

## 2022-08-29 VITALS — BP 127/70 | HR 78 | Temp 98.4°F | Resp 16 | Wt 167.0 lb

## 2022-08-29 DIAGNOSIS — Z8541 Personal history of malignant neoplasm of cervix uteri: Secondary | ICD-10-CM | POA: Insufficient documentation

## 2022-08-29 DIAGNOSIS — Z08 Encounter for follow-up examination after completed treatment for malignant neoplasm: Secondary | ICD-10-CM | POA: Diagnosis not present

## 2022-08-29 DIAGNOSIS — C539 Malignant neoplasm of cervix uteri, unspecified: Secondary | ICD-10-CM | POA: Insufficient documentation

## 2022-08-29 DIAGNOSIS — Z923 Personal history of irradiation: Secondary | ICD-10-CM | POA: Diagnosis not present

## 2022-08-29 DIAGNOSIS — K5900 Constipation, unspecified: Secondary | ICD-10-CM | POA: Diagnosis not present

## 2022-08-29 DIAGNOSIS — Z90710 Acquired absence of both cervix and uterus: Secondary | ICD-10-CM | POA: Diagnosis not present

## 2022-08-29 DIAGNOSIS — Z90722 Acquired absence of ovaries, bilateral: Secondary | ICD-10-CM | POA: Insufficient documentation

## 2022-08-29 DIAGNOSIS — K5909 Other constipation: Secondary | ICD-10-CM

## 2022-08-29 NOTE — Patient Instructions (Addendum)
It was good to see you today.  I do not see or feel any evidence of cancer recurrence on your exam.  I will contact you with pap results.  We reached out to Dr. Clabe Seal office to help get you scheduled to see him in 3 months.  I will see you for follow-up in 6 months.  As always, if you develop any new and concerning symptoms before your next visit, please call to see me sooner.

## 2022-08-29 NOTE — Progress Notes (Signed)
Gynecologic Oncology Return Clinic Visit  08/29/22  Reason for Visit: Surveillance visit in the setting of cervical cancer   Treatment History: Oncology History Overview Note  PD-L1 CPS 0%   Malignant neoplasm of cervix ([place])  05/20/2021 Initial Diagnosis   The patient reported a history of light vaginal bleeding since approximately May 2022.  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  Due to the abnormal bleeding that she was receiving she was then recommended to follow-up with her gynecologist, Dr. Talbert Nan, who first evaluated the endometrium with an endometrial biopsy performed on 05/20/2021 which revealed benign inactive atrophic endometrium negative for hyperplasia or malignancy.  The bleeding continued and the patient returned for a colposcopic evaluation of the cervix with biopsies (due to the presence of high risk HPV).  This took place on 06/17/2021.  At the time of that colposcopic evaluation there was some abnormal bleeding noted and friability to the cervix.  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   05/20/2021 Pathology Results   FINAL MICROSCOPIC DIAGNOSIS:   A. ENDOMETRIUM, BIOPSY:  - Benign inactive atrophic endometrium  - Negative for hyperplasia or malignancy   05/30/2021 Imaging   US pelvis Small anteverted uterus Thin, symmetrical endometrium Normal ovaries bilaterally No free fluid.   06/17/2021 Pathology Results   FINAL MICROSCOPIC DIAGNOSIS:   A. CERVIX, 6 O'CLOCK, BIOPSY:  -  Invasive squamous cell carcinoma  -  See comment   B. ENDOCERVIX, CURETTAGE:  -  Squamous cell carcinoma  -  See comment   COMMENT:   A.  The carcinoma is positive for p16 and p40 supporting the diagnosis  of invasive squamous cell carcinoma.     07/11/2021 PET scan   Intense hypermetabolic activity associated with ill-defined nodular enlargement of the cervix/lower uterine segment, consistent with patient's biopsy proven cervical neoplasm.   No definite scintigraphic evidence of metastatic disease in the neck, chest, abdomen or pelvis.   Right middle lobe 4-5 mm perifissural pulmonary nodule unchanged in size from October 25, 2020 and below the resolution of PET-CT but favored to reflect a peri- fissural lymph node, attention on follow-up studies suggested   Nodular focus of hypermetabolic activity along the right anterior sublingual region, which is nonspecific correlation with direct visualization is suggested.   Hypermetabolic hyperplasia of the tonsils with a hypermetabolic 5 mm left level 1b cervical lymph node, favored reactive.   Hypermetabolic activity associated with callus formation in the right anterior sixth rib likely representing a healing nonpathologic rib fracture.   07/19/2021 Initial Diagnosis   Malignant neoplasm of cervix ([place])   07/23/2021 Cancer Staging   Staging form: Cervix Uteri, AJCC Version 9 - Clinical stage from 07/23/2021: FIGO Stage IB3 (cT1b3, cN0, cM0) - Signed by [name], MD on 07/23/2021 Stage prefix: Initial diagnosis   07/25/2021 Imaging   1. No acute intracranial abnormality. 2. Minimal multifocal hyperintense T2-weight signal within the white matter, nonspecific. This may be seen in the setting of chronic small vessel disease or migraine headaches.     07/25/2021 Imaging   CT head No acute intracranial abnormality.   07/30/2021 Procedure   Successful placement of a power injectable Port-A-Cath via the right internal jugular vein. The catheter is ready for immediate  use.     08/02/2021 - 08/30/2021 Chemotherapy   Patient is on Treatment Plan : Cervical Cisplatin q7d     10/24/2021 Surgery   TRH/BSO  Findings: On EUA, small mobile uterus. What  appears to be residual tumor (approximately 1cm) replaces the cervical os. On intra-abdominal entry, normal upper abdominal survey. Normal omentum, small and large bowel. Uterus 6cm, normal appearing adnexa. Mild retroperitoneal edema and fibrosis given recent RT. Moderate adhesions between bladder and cervix. Good visual margin taken around the cervix while trying to preserve as much vaginal length as possible. No intra-abdominal or pelvic evidence of disease.   10/24/2021 Pathology Results   A. UTERUS, CERVIX, BILATERAL FALLOPIAN TUBES AND OVARIES:  - Invasive adenosquamous carcinoma of the cervix, see comment  - Carcinoma invades for depth of 0.6 cm  - Resection margins are negative for carcinoma  - Endometriosis of the cervix  - Uterus with benign inactive endometrium  - Benign unremarkable bilateral fallopian tubes and ovaries  - See oncology table      COMMENT:   Most of the tumor shows a squamous morphology with patchy areas of  glandular differentiation. Immunostains show that the tumor cells are  positive for p16, CK5/6 and show evidence of mucin production with  mucicarmine special stain.  Immunostain for p63 is negative.  This  immunoprofile is consistent with above interpretation.     ONCOLOGY TABLE:   UTERINE CERVIX, CARCINOMA: Resection   Procedure: Total hysterectomy and bilateral salpingo-oophorectomy  Tumor Size: 0.8 cm (as measured on HE glass slide)  Histologic Type: Adenosquamous carcinoma  Histologic Grade: G3: Poorly differentiated  Stromal Invasion: Present       Depth of stromal invasion (mm): 6 mm  Other Tissue/ Organ: Not applicable  Margins: All margins negative for invasive carcinoma  Margin Status for HSIL or AIS: All margins negative for HSIL or AIS  Lymphovascular invasion: Not identified       Regional Lymph Nodes: Not applicable (no lymph nodes submitted or  found)  Distant Metastasis:       Distant sites involved: Not applicable  Pathologic  Stage Classification (pTNM, AJCC 8th Edition): pT1b1, pN not  assigned  Ancillary Studies: Can be performed upon request  Representative Tumor Block: A6    01/06/2022 Procedure   Removal of implanted Port-A-Cath utilizing sharp and blunt dissection. The procedure was uncomplicated.     02/13/2022 Imaging   CT A/P: 1. No evidence of metastatic disease in the abdomen or pelvis. 2. Small hiatal hernia. 3. Congenital small bowel malrotation.  No acute abnormality.     Interval History: She had her port removed in March.   From a GYN standpoint, the patient is doing well.  Unfortunately she had some seizures over the summer, last was in July.  She is now on medication and seeing a neurologist.  She is unable to drive.  She denies any vaginal bleeding or discharge.  Denies any pelvic or abdominal pain.  Has some occasional constipation in the morning, had stopped the magnesium that she was regularly taking.  Denies any recent weight changes.  Past Medical/Surgical History: Past Medical History:  Diagnosis Date   Anxiety    Arthritis    Arthritis of ankle    Right   Cervical cancer ([place])    Depression    Episode of loss of consciousness    History of radiation therapy    Pelvis 07/31/2021-09/03/2021  HDR tandem and ring 09/26/2021  Dr Gery Pray   Hypothyroidism  Insomnia    Pure hypercholesterolemia    Seizure [facility])     Past Surgical History:  Procedure Laterality Date   ANKLE SURGERY Right    [facility]   IR IMAGING GUIDED PORT INSERTION  07/30/2021   IR REMOVAL TUN ACCESS W/ PORT W/O FL MOD SED  01/03/2022   OPERATIVE ULTRASOUND N/A 09/26/2021   Procedure: OPERATIVE ULTRASOUND;  Surgeon: Gery Pray, MD;  Location: [facility];  Service: Urology;  Laterality: N/A;   ROBOTIC ASSISTED TOTAL HYSTERECTOMY WITH BILATERAL SALPINGO OOPHERECTOMY Bilateral 10/24/2021   Procedure: XI ROBOTIC ASSISTED TOTAL HYSTERECTOMY WITH BILATERAL SALPINGO OOPHORECTOMY;  Surgeon: Lafonda Mosses, MD;   Location: [facility];  Service: Gynecology;  Laterality: Bilateral;   TANDEM RING INSERTION N/A 09/26/2021   Procedure: TANDEM RING INSERTION;  Surgeon: Gery Pray, MD;  Location: [facility];  Service: Urology;  Laterality: N/A;   WISDOM TOOTH EXTRACTION      Family History  Problem Relation Age of Onset   Heart attack Father    Diabetes Brother    Pancreatic cancer Maternal Grandmother    Ovarian cancer Other    Endometrial cancer Other    Colon cancer Other    Prostate cancer Other    Pancreatic cancer Other    Sudden death Neg Hx    Hyperlipidemia Neg Hx    Hypertension Neg Hx    Breast cancer Neg Hx     Social History   Socioeconomic History   Marital status: Married    Spouse name: Not on file   Number of children: 2   Years of education: Not on file   Highest education level: Not on file  Occupational History   Not on file  Tobacco Use   Smoking status: Former    Packs/day: 0.25    Years: 25.00    Total pack years: 6.25    Types: Cigarettes    Quit date: 2016    Years since quitting: 7.8   Smokeless tobacco: Never   Tobacco comments:    20+ yrs  Vaping Use   Vaping Use: Never used  Substance and Sexual Activity   Alcohol use: Yes    Comment: 14 drinks a week of wine   Drug use: Yes    Frequency: 7.0 times per week    Types: Marijuana    Comment: Last use 07-10-21   Sexual activity: Not Currently    Birth control/protection: Post-menopausal  Other Topics Concern   Not on file  Social History Narrative   Lives alone   R handed   Caffeine: 1 C of coffee a day   Social Determinants of Radio broadcast assistant Strain: Not on file  Food Insecurity: Not on file  Transportation Needs: Not on file  Physical Activity: Not on file  Stress: Not on file  Social Connections: Not on file    Current Medications:  Current Outpatient Medications:    ALPRAZolam (XANAX) 0.5 MG tablet, TAKE ONE TABLET BY MOUTH  AT BEDTIME AS NEEDED FOR ANXIETY, Disp: 90 tablet,  Rfl: 1   Bioflavonoid Products (ESTER-C) TABS, Take 1 tablet by mouth daily., Disp: , Rfl:    Brimonidine Tartrate (LUMIFY) 0.025 % SOLN, Place 1 drop into both eyes daily as needed (red eyes)., Disp: , Rfl:    FLUoxetine (PROZAC) 20 MG capsule, Take 3 capsules (60 mg total) by mouth every morning., Disp: 90 capsule, Rfl: 5   ibuprofen (ADVIL) 800 MG tablet, Take 1 tablet by mouth every 8 hours  as needed for moderate pain (after surgery only), Disp: 30 tablet, Rfl: 1   lamoTRIgine (LAMICTAL) 100 MG tablet, Take 1 tablet (100 mg total) by mouth 2 (two) times daily., Disp: 180 tablet, Rfl: 1   lamoTRIgine (LAMICTAL) 25 MG tablet, TAKE ONE TABLET BY MOUTH TWICE A DAY, Disp: 180 tablet, Rfl: 0   levETIRAcetam (KEPPRA) 500 MG tablet, TAKE ONE TABLET BY MOUTH TWICE A DAY, Disp: 180 tablet, Rfl: 1   levothyroxine (SYNTHROID) 50 MCG tablet, Take 1 tablet by mouth daily before breakfast., Disp: 90 tablet, Rfl: 0   Midazolam (NAYZILAM) 5 MG/0.1ML SOLN, Place 5 mg into the nose as needed., Disp: 2 each, Rfl: 2   rosuvastatin (CRESTOR) 10 MG tablet, Take 1 tablet (10 mg total) by mouth daily. Please make overdue appt with Dr. Johney Frame before anymore refills. Thank you 1st attempt, Disp: 90 tablet, Rfl: 3   zolpidem (AMBIEN) 10 MG tablet, Take 1 tablet (10 mg total) by mouth at bedtime as needed., Disp: 30 tablet, Rfl: 1   cromolyn (OPTICROM) 4 % ophthalmic solution, , Disp: , Rfl:   Review of Systems: + Fatigue, constipation, joint pain, seizures, anxiety, depression, confusion. Denies appetite changes, fevers, chills, unexplained weight changes. Denies hearing loss, neck lumps or masses, mouth sores, ringing in ears or voice changes. Denies cough or wheezing.  Denies shortness of breath. Denies chest pain or palpitations. Denies leg swelling. Denies abdominal distention, pain, blood in stools, diarrhea, nausea, vomiting, or early satiety. Denies pain with intercourse, dysuria, frequency, hematuria or  incontinence. Denies hot flashes, pelvic pain, vaginal bleeding or vaginal discharge.   Denies back pain or muscle pain/cramps. Denies itching, rash, or wounds. Denies dizziness, headaches, numbness or seizures. Denies swollen lymph nodes or glands, denies easy bruising or bleeding. Denies decreased concentration.  Physical Exam: BP 127/70 (BP Location: Left Arm, Patient Position: Sitting)   Pulse 78   Temp 98.4 F (36.9 C) (Oral)   Resp 16   Wt 167 lb (75.8 kg)   SpO2 98%   BMI 25.58 kg/m  General: Alert, oriented, no acute distress. HEENT: Normocephalic, atraumatic, sclera anicteric. Chest: Clear to auscultation bilaterally.  No wheezes or rhonchi. Cardiovascular: Regular rate and rhythm, no murmurs. Abdomen: soft, nontender.  Normoactive bowel sounds.  No masses or hepatosplenomegaly appreciated.  Well-healed incisions, suture excised from to right lateral incisions. Extremities: Grossly normal range of motion.  Warm, well perfused.  No edema bilaterally. Skin: No rashes or lesions noted. GU: Normal appearing external genitalia without erythema, excoriation, or lesions.  Speculum exam reveals cuff intact, no visible lesions or masses.  No bleeding or discharge.  Moderate atrophy noted.  On bimanual exam, cuff is smooth, no nodularity.  This is confirmed on rectovaginal exam.  Laboratory & Radiologic Studies: CT A/P 01/2022: IMPRESSION: 1. No evidence of metastatic disease in the abdomen or pelvis. 2. Small hiatal hernia. 3. Congenital small bowel malrotation.  No acute abnormality.  Assessment & Plan: Ashley Hammond is a 56 y.o. woman with Stage IB3 SCC of the cervix status post completion surgery after pelvic radiation and one HDR treatment who presents for surveillance visit. Interval surgery 09/2021. CPS 0%   Patient is doing well from a gynecologic cancer standpoint and is NED on exam today.  Pap and HPV testing collected today.  I will contact her with these  results.  I asked my office to to facilitate getting the patient scheduled to see Dr. Sondra Come and in February.  I will see her  back for a visit 3 months after that.  We discussed plan to alternate visits initially every 3 months between our 2 offices for the first 2 is after completing treatment. We will plan on yearly cotesting.   If she were develop concerning symptoms or have findings on exam that were suspicious for recurrence, I stressed the importance of calling to let me know.  I encouraged her to restart either magnesium or other medication, such as MiraLAX, to see if this helps with constipation.  If she were not to have improvement in her constipation, I have asked her to let me know.  22 minutes of total time was spent for this patient encounter, including preparation, face-to-face counseling with the patient and coordination of care, and documentation of the encounter.  Jeral Pinch, MD  Division of Gynecologic Oncology  Department of Obstetrics and Gynecology  [facility]

## 2022-09-01 ENCOUNTER — Other Ambulatory Visit (HOSPITAL_COMMUNITY): Payer: Self-pay

## 2022-09-01 ENCOUNTER — Other Ambulatory Visit: Payer: Self-pay | Admitting: Internal Medicine

## 2022-09-01 MED ORDER — CROMOLYN SODIUM 4 % OP SOLN
2.0000 [drp] | OPHTHALMIC | 0 refills | Status: DC | PRN
  Filled 2022-09-01: qty 10, fill #0

## 2022-09-01 MED ORDER — CROMOLYN SODIUM 4 % OP SOLN
1.0000 [drp] | OPHTHALMIC | 0 refills | Status: DC | PRN
  Filled 2022-09-01: qty 10, 75d supply, fill #0
  Filled 2022-11-30: qty 10, 30d supply, fill #0

## 2022-09-03 LAB — CYTOLOGY - PAP
Comment: NEGATIVE
Diagnosis: NEGATIVE
Diagnosis: REACTIVE
High risk HPV: NEGATIVE

## 2022-09-04 ENCOUNTER — Other Ambulatory Visit: Payer: Self-pay | Admitting: Internal Medicine

## 2022-09-04 ENCOUNTER — Other Ambulatory Visit (HOSPITAL_COMMUNITY): Payer: Self-pay

## 2022-09-04 DIAGNOSIS — M25562 Pain in left knee: Secondary | ICD-10-CM | POA: Diagnosis not present

## 2022-09-04 DIAGNOSIS — M67911 Unspecified disorder of synovium and tendon, right shoulder: Secondary | ICD-10-CM | POA: Diagnosis not present

## 2022-09-04 MED ORDER — LEVOTHYROXINE SODIUM 50 MCG PO TABS
50.0000 ug | ORAL_TABLET | Freq: Every day | ORAL | 0 refills | Status: DC
  Filled 2022-09-04: qty 30, 30d supply, fill #0
  Filled 2022-10-04: qty 30, 30d supply, fill #1
  Filled 2022-11-01: qty 30, 30d supply, fill #2

## 2022-09-04 MED ORDER — IBUPROFEN 400 MG PO TABS
400.0000 mg | ORAL_TABLET | Freq: Two times a day (BID) | ORAL | 0 refills | Status: DC
  Filled 2022-09-04: qty 60, 30d supply, fill #0

## 2022-09-05 ENCOUNTER — Other Ambulatory Visit (HOSPITAL_COMMUNITY): Payer: Self-pay

## 2022-09-09 ENCOUNTER — Encounter: Payer: Self-pay | Admitting: Internal Medicine

## 2022-09-16 ENCOUNTER — Encounter: Payer: 59 | Admitting: Internal Medicine

## 2022-10-04 ENCOUNTER — Other Ambulatory Visit (HOSPITAL_COMMUNITY): Payer: Self-pay

## 2022-10-06 ENCOUNTER — Other Ambulatory Visit (HOSPITAL_COMMUNITY): Payer: Self-pay

## 2022-10-15 ENCOUNTER — Ambulatory Visit: Payer: 59 | Admitting: Adult Health

## 2022-10-16 ENCOUNTER — Ambulatory Visit: Payer: 59 | Admitting: Internal Medicine

## 2022-11-01 ENCOUNTER — Other Ambulatory Visit (HOSPITAL_COMMUNITY): Payer: Self-pay

## 2022-11-04 ENCOUNTER — Ambulatory Visit: Payer: 59 | Admitting: Internal Medicine

## 2022-11-18 ENCOUNTER — Encounter: Payer: Self-pay | Admitting: Neurology

## 2022-11-24 NOTE — Progress Notes (Signed)
Ashley Hammond is here today for follow up post radiation to the pelvic.  They completed their radiation on: 09/26/2022   Does the patient complain of any of the following:  Pain:Denies any pain Abdominal bloating: denies  Diarrhea/Constipation: constipation ,states that she is not taking anything for relief Nausea/Vomiting: no Vaginal Discharge: no Blood in Urine or Stool: no Urinary Issues (dysuria/incomplete emptying/ incontinence/ increased frequency/urgency): none Does patient report using vaginal dilator 2-3 times a week and/or sexually active 2-3 weeks: states that she did not have a dilator. Post radiation skin changes:  Denies any issues  Additional comments if applicable:  Vitals:   00/86/76 1509  BP: 127/84  Pulse: 88  Resp: 20  Temp: 97.8 F (36.6 C)  SpO2: 98%  Weight: 78.9 kg  Height: '5\' 9"'$  (1.753 m)

## 2022-11-30 ENCOUNTER — Other Ambulatory Visit: Payer: Self-pay | Admitting: Internal Medicine

## 2022-11-30 NOTE — Progress Notes (Signed)
Radiation Oncology         [phone] ________________________________  Name: Ashley Hammond MRN: [id number]  Date: 12/01/2022  DOB: 02-26-1966  Follow-Up Visit Note  CC: Elby Showers, MD  Everitt Amber, MD  No diagnosis found.  Diagnosis: The encounter diagnosis was Malignant neoplasm of cervix, unspecified site [facility]).   Squamous cell carcinoma of the cervix, Clinical Stage IB3  Interval Since Last Radiation: 1 year, 2 months, and 4 days   Intent: Curative  Radiation Treatment Dates: 07/31/2021 through 09/26/2021 Site Technique Total Dose (Gy) Dose per Fx (Gy) Completed Fx Beam Energies  Pelvis: Pelvis IMRT 45/45 1.8 25/25 15X  Cervix: Cervix_Bst_Fx1 HDR-brachy 5.5/5.5 5.5 1/1 Ir-192   Narrative:  The patient returns today for routine follow-up. The patient has not been seen since she completed radiation therapy in December 2022.   Since completing radiation therapy, the patient proceeded to undergo a total hysterectomy with BSO on 10/24/21 under the care of Dr. Berline Lopes. Pathology from the procedure revealed: tumor the size of 0.8 cm; histology of grade 3 poorly differentiated invasive adenosquamous carcinoma of the cervix and endometriosis of the cervix; positive for stromal invasion measuring 6 mm; negative for LVI; all resection margins negative for carcinoma; no lymph nodes were examined.       During a follow-up visit with Dr. Berline Lopes on 02/27/22, the patient denied any symptoms concerning for disease recurrence, and she was noted to be NED on examination.      During her most recent follow-up visit with Dr. Berline Lopes on 08/29/22, the patient again denied any symptoms concerning for disease recurrence and she was noted to be NED on exam. (She did endorse some occasional constipation, especially in the morning).         Pap collected during her visit with Dr. Berline Lopes on 08/29/22 was negative.   Of note: The patient presented to the ED on 05/20/22 following a seizure occurring  earlier that day. 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 She is on lamotrigine 25 mg twice daily, Keppra 500 mg twice daily, and is followed by Dr. Brett Fairy with [facility]). Her seizure was witnessed by EMS, who administered her with 2.5 mg of IV Versed.  CT of the head performed in the ED showed no acute intracranial abnormalities. ED course included Reglan for headache with improvement. Work-up was overall reassuring and the patient was discharged home without any more seizures occurring in the ED.   She later presented for a follow-up MRI of brain performed at [facility] on 05/27/22 which was unremarkable.    Other pertinent imaging performed in the interval includes:   -- Follow-up CT of the abdomen and pelvis on 02/13/22 showed no evidence of metastatic disease in the abdomen or pelvis.  -- Bilateral breast MRI on 08/26/22 showed no evidence of malignancy in either breast.    ***              Allergies:  has No Known Allergies.  Meds: Current Outpatient Medications  Medication Sig Dispense Refill   ALPRAZolam (XANAX) 0.5 MG tablet TAKE ONE TABLET BY MOUTH  AT BEDTIME AS NEEDED FOR ANXIETY 90 tablet 1   Bioflavonoid Products (ESTER-C) TABS Take 1 tablet by mouth daily.     Brimonidine Tartrate (LUMIFY) 0.025 % SOLN Place 1 drop into both eyes daily as needed (red eyes).     cromolyn (OPTICROM) 4 % ophthalmic solution Place 1 drop  into both eyes as needed. 10 mL 0   FLUoxetine (PROZAC) 20 MG capsule Take 3 capsules (60 mg total) by mouth every morning. 90 capsule 5   ibuprofen (ADVIL) 400 MG tablet Take 1 tablet (400 mg total) by mouth 2 (two) times daily. 60 tablet 0   ibuprofen (ADVIL) 800 MG tablet Take 1 tablet by mouth every 8 hours as needed for moderate pain (after surgery only) 30 tablet 1   lamoTRIgine (LAMICTAL) 100 MG tablet Take 1 tablet (100 mg total) by mouth 2 (two) times  daily. 180 tablet 1   lamoTRIgine (LAMICTAL) 25 MG tablet TAKE ONE TABLET BY MOUTH TWICE A DAY 180 tablet 0   levETIRAcetam (KEPPRA) 500 MG tablet TAKE ONE TABLET BY MOUTH TWICE A DAY 180 tablet 1   levothyroxine (SYNTHROID) 50 MCG tablet Take 1 tablet by mouth daily before breakfast. 90 tablet 0   Midazolam (NAYZILAM) 5 MG/0.1ML SOLN Place 5 mg into the nose as needed. 2 each 2   rosuvastatin (CRESTOR) 10 MG tablet Take 1 tablet (10 mg total) by mouth daily. Please make overdue appt with Dr. Johney Frame before anymore refills. Thank you 1st attempt 90 tablet 3   zolpidem (AMBIEN) 10 MG tablet Take 1 tablet (10 mg total) by mouth at bedtime as needed. 30 tablet 1   No current facility-administered medications for this encounter.    Physical Findings: The patient is in no acute distress. Patient is alert and oriented.  vitals were not taken for this visit. .  No significant changes. Lungs are clear to auscultation bilaterally. Heart has regular rate and rhythm. No palpable cervical, supraclavicular, or axillary adenopathy. Abdomen soft, non-tender, normal bowel sounds.  On pelvic examination the external genitalia were unremarkable. A speculum exam was performed. There are no mucosal lesions noted in the vaginal vault. A Pap smear was obtained of the proximal vagina. On bimanual and rectovaginal examination there were no pelvic masses appreciated. ***   Lab Findings: Lab Results  Component Value Date   WBC 3.8 07/03/2022   HGB 13.4 07/03/2022   HCT 39.3 07/03/2022   MCV 99.5 07/03/2022   PLT 207 07/03/2022    Radiographic Findings: No results found.  Impression:  The encounter diagnosis was Malignant neoplasm of cervix, unspecified site [facility]).   Squamous cell carcinoma of the cervix, Clinical Stage IB3   The patient is recovering from the effects of radiation.  ***  Plan:  ***   *** minutes of total time was spent for this patient encounter, including preparation, face-to-face  counseling with the patient and coordination of care, physical exam, and documentation of the encounter. ____________________________________  Blair Promise, PhD, MD  This document serves as a record of services personally performed by Gery Pray, MD. It was created on his behalf by Roney Mans, a trained medical scribe. The creation of this record is based on the scribe's personal observations and the provider's statements to them. This document has been checked and approved by the attending provider.

## 2022-12-01 ENCOUNTER — Other Ambulatory Visit: Payer: Self-pay

## 2022-12-01 ENCOUNTER — Encounter: Payer: Self-pay | Admitting: Radiation Oncology

## 2022-12-01 ENCOUNTER — Ambulatory Visit
Admission: RE | Admit: 2022-12-01 | Discharge: 2022-12-01 | Disposition: A | Discharge status: Home / Self Care | Payer: 59 | Source: Ambulatory Visit | Attending: Radiation Oncology | Admitting: Radiation Oncology

## 2022-12-01 ENCOUNTER — Other Ambulatory Visit (HOSPITAL_COMMUNITY): Payer: Self-pay

## 2022-12-01 VITALS — BP 127/84 | HR 88 | Temp 97.8°F | Resp 20 | Ht 69.0 in | Wt 174.0 lb

## 2022-12-01 DIAGNOSIS — Z923 Personal history of irradiation: Secondary | ICD-10-CM | POA: Insufficient documentation

## 2022-12-01 DIAGNOSIS — Z8542 Personal history of malignant neoplasm of other parts of uterus: Secondary | ICD-10-CM | POA: Diagnosis not present

## 2022-12-01 DIAGNOSIS — Z791 Long term (current) use of non-steroidal anti-inflammatories (NSAID): Secondary | ICD-10-CM | POA: Diagnosis not present

## 2022-12-01 DIAGNOSIS — Z79899 Other long term (current) drug therapy: Secondary | ICD-10-CM | POA: Diagnosis not present

## 2022-12-01 DIAGNOSIS — C539 Malignant neoplasm of cervix uteri, unspecified: Secondary | ICD-10-CM | POA: Diagnosis not present

## 2022-12-01 MED ORDER — LEVOTHYROXINE SODIUM 50 MCG PO TABS
50.0000 ug | ORAL_TABLET | Freq: Every day | ORAL | 0 refills | Status: DC
  Filled 2022-12-01: qty 30, 30d supply, fill #0
  Filled 2023-01-20: qty 30, 30d supply, fill #1
  Filled 2023-02-12 – 2023-02-13 (×2): qty 30, 30d supply, fill #2

## 2022-12-02 ENCOUNTER — Other Ambulatory Visit: Payer: Self-pay

## 2022-12-09 ENCOUNTER — Encounter: Payer: Self-pay | Admitting: Internal Medicine

## 2022-12-09 ENCOUNTER — Ambulatory Visit (INDEPENDENT_AMBULATORY_CARE_PROVIDER_SITE_OTHER): Payer: 59 | Admitting: Internal Medicine

## 2022-12-09 VITALS — BP 126/78 | HR 90 | Ht 68.0 in | Wt 176.8 lb

## 2022-12-09 DIAGNOSIS — T884XXA Failed or difficult intubation, initial encounter: Secondary | ICD-10-CM | POA: Diagnosis not present

## 2022-12-09 DIAGNOSIS — Z1211 Encounter for screening for malignant neoplasm of colon: Secondary | ICD-10-CM

## 2022-12-09 NOTE — Progress Notes (Unsigned)
Chief Complaint: Discuss colonoscopy  HPI : 57 year old female with history of hypothyroidism, seizure, cervical cancer s/p surgery/chemo/radiation in remission, and arthritis presents to discuss colonoscopy  She has never had a colonoscopy in the past. She has been labelled a difficulty airway in the past for hysterectomy. Denies hematochezia, melena. Mother had colon polyps. She had a hysterectomy. Last seizure was in 04/2022. Denies N&V, dypshaiga, odynophagia, chest burning, regurigtation. Denies use of bloo dthinners.   Past Medical History:  Diagnosis Date   Anxiety    Arthritis    Arthritis of ankle    Right   Cervical cancer ([place])    Depression    Episode of loss of consciousness    History of radiation therapy    Pelvis 07/31/2021-09/03/2021  HDR tandem and ring 09/26/2021  Dr Gery Pray   Hypothyroidism    Insomnia    Pure hypercholesterolemia    Seizure [facility])      Past Surgical History:  Procedure Laterality Date   ANKLE SURGERY Right    [facility]   IR IMAGING GUIDED PORT INSERTION  07/30/2021   IR REMOVAL TUN ACCESS W/ PORT W/O FL MOD SED  01/03/2022   OPERATIVE ULTRASOUND N/A 09/26/2021   Procedure: OPERATIVE ULTRASOUND;  Surgeon: Gery Pray, MD;  Location: [facility];  Service: Urology;  Laterality: N/A;   ROBOTIC ASSISTED TOTAL HYSTERECTOMY WITH BILATERAL SALPINGO OOPHERECTOMY Bilateral 10/24/2021   Procedure: XI ROBOTIC ASSISTED TOTAL HYSTERECTOMY WITH BILATERAL SALPINGO OOPHORECTOMY;  Surgeon: Lafonda Mosses, MD;  Location: [facility];  Service: Gynecology;  Laterality: Bilateral;   TANDEM RING INSERTION N/A 09/26/2021   Procedure: TANDEM RING INSERTION;  Surgeon: Gery Pray, MD;  Location: [facility];  Service: Urology;  Laterality: N/A;   WISDOM TOOTH EXTRACTION     Family History  Problem Relation Age of Onset   Heart attack Father    Diabetes Brother    Pancreatic cancer Maternal Grandmother    Ovarian cancer Other    Endometrial cancer Other    Colon cancer  Other    Prostate cancer Other    Pancreatic cancer Other    Sudden death Neg Hx    Hyperlipidemia Neg Hx    Hypertension Neg Hx    Breast cancer Neg Hx    Social History   Tobacco Use   Smoking status: Former    Packs/day: 0.25    Years: 25.00    Total pack years: 6.25    Types: Cigarettes    Quit date: 2016    Years since quitting: 8.1   Smokeless tobacco: Never   Tobacco comments:    20+ yrs  Vaping Use   Vaping Use: Never used  Substance Use Topics   Alcohol use: Yes    Comment: 2 a day   Drug use: Yes    Frequency: 7.0 times per week    Types: Marijuana    Comment: Last use 07-10-21   Current Outpatient Medications  Medication Sig Dispense Refill   ALPRAZolam (XANAX) 0.5 MG tablet TAKE ONE TABLET BY MOUTH  AT BEDTIME AS NEEDED FOR ANXIETY 90 tablet 1   Brimonidine Tartrate (LUMIFY) 0.025 % SOLN Place 1 drop into both eyes daily as needed (red eyes).     cromolyn (OPTICROM) 4 % ophthalmic solution Place 1 drop into both eyes as needed. 10 mL 0   FLUoxetine (PROZAC) 20 MG capsule Take 3 capsules (60 mg total) by mouth every morning. 90 capsule 5   ibuprofen (ADVIL) 400 MG  tablet Take 1 tablet (400 mg total) by mouth 2 (two) times daily. (Patient taking differently: Take 400 mg by mouth every 6 (six) hours as needed.) 60 tablet 0   lamoTRIgine (LAMICTAL) 100 MG tablet Take 1 tablet (100 mg total) by mouth 2 (two) times daily. 180 tablet 1   levETIRAcetam (KEPPRA) 500 MG tablet TAKE ONE TABLET BY MOUTH TWICE A DAY 180 tablet 1   levothyroxine (SYNTHROID) 50 MCG tablet Take 1 tablet (50 mcg total) by mouth daily before breakfast. 90 tablet 0   Midazolam (NAYZILAM) 5 MG/0.1ML SOLN Place 5 mg into the nose as needed. 2 each 2   rosuvastatin (CRESTOR) 10 MG tablet Take 1 tablet (10 mg total) by mouth daily. Please make overdue appt with Dr. Johney Frame before anymore refills. Thank you 1st attempt 90 tablet 3   zolpidem (AMBIEN) 10 MG tablet Take 1 tablet (10 mg total) by mouth  at bedtime as needed. 30 tablet 1   No current facility-administered medications for this visit.   No Known Allergies   Review of Systems: All systems reviewed and negative except where noted in HPI.   Physical Exam: Ht 5' 8"$  (1.727 m)   Wt 176 lb 12.8 oz (80.2 kg)   BMI 26.88 kg/m  Constitutional: Pleasant,well-developed, ***female in no acute distress. HEENT: Normocephalic and atraumatic. Conjunctivae are normal. No scleral icterus. Cardiovascular: Normal rate, regular rhythm.  Pulmonary/chest: Effort normal and breath sounds normal. No wheezing, rales or rhonchi. Abdominal: Soft, nondistended, nontender. Bowel sounds active throughout. There are no masses palpable. No hepatomegaly. Extremities: No edema Neurological: Alert and oriented to person place and time. Skin: Skin is warm and dry. No rashes noted. Psychiatric: Normal mood and affect. Behavior is normal.  Labs 06/2022: CBC and CMP unremarkable. TSH nml.   CT A/P w/contrast 02/13/22: IMPRESSION: 1. No evidence of metastatic disease in the abdomen or pelvis. 2. Small hiatal hernia. 3. Congenital small bowel malrotation.  No acute abnormality.  ASSESSMENT AND PLAN: Colon cancer screening Difficult airway - Colonoscopy [facility] for difficult airway  Christia Reading, MD

## 2022-12-09 NOTE — Patient Instructions (Signed)
_______________________________________________________  If your blood pressure at your visit was 140/90 or greater, please contact your primary care physician to follow up on this.  _______________________________________________________  If you are age 59 or older, your body mass index should be between 23-30. Your Body mass index is 26.88 kg/m. If this is out of the aforementioned range listed, please consider follow up with your Primary Care Provider.  If you are age 51 or younger, your body mass index should be between 19-25. Your Body mass index is 26.88 kg/m. If this is out of the aformentioned range listed, please consider follow up with your Primary Care Provider.   ________________________________________________________  The [facility] providers would like to encourage you to use [facility] to communicate with providers for non-urgent requests or questions.  Due to long hold times on the telephone, sending your provider a message by [facility] may be a faster and more efficient way to get a response.  Please allow 48 business hours for a response.  Please remember that this is for non-urgent requests.  _______________________________________________________  Dennis Bast have been scheduled for a colonoscopy. Please follow written instructions given to you at your visit today.  Please pick up your prep supplies at the pharmacy within the next 1-3 days. If you use inhalers (even only as needed), please bring them with you on the day of your procedure.

## 2022-12-10 ENCOUNTER — Other Ambulatory Visit: Payer: Self-pay | Admitting: Neurology

## 2022-12-11 ENCOUNTER — Encounter (HOSPITAL_COMMUNITY): Payer: Self-pay | Admitting: Internal Medicine

## 2022-12-11 NOTE — Progress Notes (Signed)
Attempted to obtain medical history via telephone, unable to reach at this time. HIPAA compliant voicemail message left requesting return call to pre surgical testing department. 

## 2022-12-17 ENCOUNTER — Encounter (HOSPITAL_COMMUNITY): Payer: Self-pay | Admitting: Anesthesiology

## 2022-12-17 ENCOUNTER — Telehealth: Payer: Self-pay

## 2022-12-17 NOTE — Telephone Encounter (Signed)
Pre-operative Risk Assessment     Request for surgical clearance:     Endoscopy Procedure  What type of surgery is being performed?     colonoscopy  When is this surgery scheduled?     To be determined once medically cleared  What type of clearance is required ?   Medical  Are there any medications that need to be held prior to surgery and how long? no  Practice name and name of physician performing surgery?      [facility] by Dr Sharyn Creamer, MD  What is your office phone and fax number?      Phone- [phone]  [phone]  Anesthesia type (None, local, MAC, general) ?       MAC  Please route back to sender Ashley Evener, LPN  Thank you

## 2022-12-17 NOTE — Telephone Encounter (Signed)
Alerted by hospital staff the patient was reporting vomiting and seizure activity today. Contacted the patient who confirms this. She has "been sick in the bed all day" and she has only consumed water, no other liquids. She states she has been vomiting, and has a "splitting headache which happens after a seizure." Advised the patient her colonoscopy will be postponed until she is cleared by her Neurologist. Advised to abort her prep and follow her protocol for seizures. We will be in touch.  Message to Dr Jerold Coombe.

## 2022-12-18 ENCOUNTER — Ambulatory Visit (HOSPITAL_COMMUNITY): Admission: RE | Admit: 2022-12-18 | Payer: 59 | Source: Ambulatory Visit | Admitting: Internal Medicine

## 2022-12-18 SURGERY — COLONOSCOPY WITH PROPOFOL
Anesthesia: Monitor Anesthesia Care

## 2022-12-18 NOTE — Telephone Encounter (Signed)
Request for medical clearance :    Ashley Hammond is a 57 y.o. 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 Brett Fairy with [facility]), hypertension, hyperlipidemia, anxiety, malignancy of cervix status post surgery atrial fibrillation in 1 discharge.   The patient is to take her oral medication morning dose earlier for the day of surgery: Keppra and Lamotrigine before midnight po ,  and if alert and cooperative enough by 12 noon on the day of procedure / surgery, she will take her second dose at that time.  ON Postoperative Day 1, the patient is to resume with her established dosing times po.    Larey Seat, MD

## 2023-01-02 NOTE — Progress Notes (Shared)
Patient Care Team: Elby Showers, MD as PCP - General (Internal Medicine) Freada Bergeron, MD as PCP - Cardiology (Cardiology)  Visit Date: 01/02/23  Subjective:    Patient ID: Ashley Hammond , Female   DOB: 05-14-1966, 57 y.o.    MRN: [id number]   56 y.o. Female presents today for 6 month follow-up. Patient has a past medical history of anxiety, arthritis, cervical cancer, depression, syncope, radiation therapy, hypothyroidism, insomnia, pure hypercholesterolemia, seizure.  12/18/22 colonoscopy canceled.  History of seizures treated with Lamictal 100 mg twice daily.  History of pure hypercholesterolemia treated with Crestor 10 mg daily at bedtime.  History of insomnia treated with Ambien 10 mg at bedtime as needed.  History of hypothyroidism treated with Synthroid 50 mcg daily before breakfast.   History of anxiety and depression treated with Prozac 60 mg daily in the morning, Xanax 0.5 mg at bedtime as needed.     Past Medical History:  Diagnosis Date   Anxiety    Arthritis    Arthritis of ankle    Right   Cervical cancer ([place]) 2020   Depression    Episode of loss of consciousness    History of radiation therapy    Pelvis 07/31/2021-09/03/2021  HDR tandem and ring 09/26/2021  Dr Gery Pray   Hypothyroidism    Insomnia    Pure hypercholesterolemia    Seizure ([place])      Family History  Problem Relation Age of Onset   Colon polyps Mother    Heart attack Father    Diabetes Brother    Pancreatic cancer Maternal Grandmother    Sudden death Neg Hx    Hyperlipidemia Neg Hx    Hypertension Neg Hx    Breast cancer Neg Hx    Colon cancer Neg Hx     Social History   Social History Narrative   Lives alone   R handed   Caffeine: 1 C of coffee a day      ROS      Objective:   Vitals: There were no vitals taken for this visit.   Physical Exam    Results:   Studies obtained and personally reviewed by me:  Imaging, colonoscopy, mammogram,  bone density scan, echocardiogram, heart cath, stress test, CT calcium score, etc. ***   Labs:       Component Value Date/Time   NA 142 07/03/2022 1157   K 5.1 07/03/2022 1157   CL 106 07/03/2022 1157   CO2 31 07/03/2022 1157   GLUCOSE 88 07/03/2022 1157   BUN 12 07/03/2022 1157   CREATININE 0.85 07/03/2022 1157   CALCIUM 9.8 07/03/2022 1157   PROT 6.3 07/03/2022 1157   ALBUMIN 4.5 07/25/2021 1725   AST 18 07/03/2022 1157   ALT 12 07/03/2022 1157   ALKPHOS 70 07/25/2021 1725   BILITOT 0.4 07/03/2022 1157   GFRNONAA >60 05/20/2022 2222   GFRNONAA >60 08/28/2021 1441   GFRNONAA 80 09/12/2020 1200   GFRAA 93 09/12/2020 1200     Lab Results  Component Value Date   WBC 3.8 07/03/2022   HGB 13.4 07/03/2022   HCT 39.3 07/03/2022   MCV 99.5 07/03/2022   PLT 207 07/03/2022    Lab Results  Component Value Date   CHOL 253 (H) 07/03/2022   HDL 81 07/03/2022   LDLCALC 153 (H) 07/03/2022   TRIG 83 07/03/2022   CHOLHDL 3.1 07/03/2022    No results found for: "HGBA1C"   Lab Results  Component Value Date   TSH 2.33 07/03/2022     No results found for: "PSA1", "PSA" *** delete for female pts  ***    Assessment & Plan:   ***    I,Alexander Ruley,acting as a scribe for Elby Showers, MD.,have documented all relevant documentation on the behalf of Elby Showers, MD,as directed by  Elby Showers, MD while in the presence of Elby Showers, MD.   ***

## 2023-01-06 ENCOUNTER — Telehealth: Payer: Self-pay | Admitting: Internal Medicine

## 2023-01-06 NOTE — Telephone Encounter (Signed)
Ashley Hammond [phone]  Daquisha called to cancel her 6 month appointment and fasting lab because her mother has broke her hip, she will call back to re-schedule.

## 2023-01-08 ENCOUNTER — Other Ambulatory Visit: Payer: 59

## 2023-01-08 DIAGNOSIS — E7800 Pure hypercholesterolemia, unspecified: Secondary | ICD-10-CM

## 2023-01-09 ENCOUNTER — Ambulatory Visit: Payer: 59 | Admitting: Internal Medicine

## 2023-01-20 ENCOUNTER — Other Ambulatory Visit: Payer: Self-pay

## 2023-01-20 ENCOUNTER — Other Ambulatory Visit: Payer: Self-pay | Admitting: Internal Medicine

## 2023-01-26 ENCOUNTER — Other Ambulatory Visit (HOSPITAL_COMMUNITY): Payer: Self-pay

## 2023-02-12 ENCOUNTER — Other Ambulatory Visit: Payer: Self-pay | Admitting: Neurology

## 2023-02-12 ENCOUNTER — Other Ambulatory Visit (HOSPITAL_COMMUNITY): Payer: Self-pay

## 2023-02-13 ENCOUNTER — Other Ambulatory Visit: Payer: Self-pay

## 2023-02-17 MED ORDER — NAYZILAM 5 MG/0.1ML NA SOLN
5.0000 mg | NASAL | 2 refills | Status: DC | PRN
  Filled 2023-02-17 – 2023-07-30 (×2): qty 2, 5d supply, fill #0

## 2023-02-17 MED ORDER — LAMOTRIGINE 100 MG PO TABS
100.0000 mg | ORAL_TABLET | Freq: Two times a day (BID) | ORAL | 1 refills | Status: DC
  Filled 2023-02-17 – 2023-02-18 (×2): qty 60, 30d supply, fill #0
  Filled 2023-04-05: qty 60, 30d supply, fill #1
  Filled 2023-05-01 – 2023-05-03 (×2): qty 60, 30d supply, fill #2
  Filled 2023-05-31: qty 60, 30d supply, fill #3
  Filled 2023-06-30: qty 60, 30d supply, fill #4
  Filled 2023-07-30: qty 60, 30d supply, fill #5

## 2023-02-18 ENCOUNTER — Other Ambulatory Visit (HOSPITAL_COMMUNITY): Payer: Self-pay

## 2023-02-18 ENCOUNTER — Other Ambulatory Visit: Payer: Self-pay

## 2023-02-18 ENCOUNTER — Encounter (HOSPITAL_COMMUNITY): Payer: Self-pay

## 2023-02-19 ENCOUNTER — Other Ambulatory Visit: Payer: Self-pay | Admitting: Neurology

## 2023-02-19 ENCOUNTER — Other Ambulatory Visit: Payer: Self-pay

## 2023-02-19 ENCOUNTER — Encounter: Payer: Self-pay | Admitting: Neurology

## 2023-02-19 ENCOUNTER — Other Ambulatory Visit (HOSPITAL_COMMUNITY): Payer: Self-pay

## 2023-02-19 MED ORDER — LEVETIRACETAM 500 MG PO TABS: 500.0000 mg | ORAL_TABLET | Freq: Two times a day (BID) | ORAL | 1 refills | Status: DC

## 2023-02-19 NOTE — Telephone Encounter (Signed)
Addressed in previous MC  

## 2023-02-20 ENCOUNTER — Other Ambulatory Visit: Payer: Self-pay

## 2023-03-06 ENCOUNTER — Other Ambulatory Visit: Payer: Self-pay

## 2023-03-06 ENCOUNTER — Encounter: Payer: Self-pay | Admitting: Gynecologic Oncology

## 2023-03-06 ENCOUNTER — Inpatient Hospital Stay: Payer: 59 | Attending: Gynecologic Oncology | Admitting: Gynecologic Oncology

## 2023-03-06 VITALS — BP 120/67 | HR 88 | Temp 99.0°F | Wt 171.2 lb

## 2023-03-06 DIAGNOSIS — C539 Malignant neoplasm of cervix uteri, unspecified: Secondary | ICD-10-CM

## 2023-03-06 DIAGNOSIS — Z90722 Acquired absence of ovaries, bilateral: Secondary | ICD-10-CM | POA: Insufficient documentation

## 2023-03-06 DIAGNOSIS — Z9221 Personal history of antineoplastic chemotherapy: Secondary | ICD-10-CM | POA: Diagnosis not present

## 2023-03-06 DIAGNOSIS — Z8541 Personal history of malignant neoplasm of cervix uteri: Secondary | ICD-10-CM | POA: Diagnosis not present

## 2023-03-06 DIAGNOSIS — Z90710 Acquired absence of both cervix and uterus: Secondary | ICD-10-CM | POA: Diagnosis not present

## 2023-03-06 NOTE — Patient Instructions (Signed)
It was good to see you today.  I do not see or feel any evidence of cancer recurrence on your exam.  I will see you for follow-up in 6 months.  As always, if you develop any new and concerning symptoms before your next visit, please call to see me sooner.   

## 2023-03-06 NOTE — Progress Notes (Signed)
Gynecologic Oncology Return Clinic Visit  03/06/23  Reason for Visit: Surveillance visit in the setting of cervical cancer   Treatment History: Oncology History Overview Note  PD-L1 CPS 0%   Malignant neoplasm of cervix (HCC)  05/20/2021 Initial Diagnosis   The patient reported a history of light vaginal bleeding since approximately May 2022.  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  Due to the abnormal bleeding that she was receiving she was then recommended to follow-up with her gynecologist, Dr. Oscar La, who first evaluated the endometrium with an endometrial biopsy performed on 05/20/2021 which revealed benign inactive atrophic endometrium negative for hyperplasia or malignancy.  The bleeding continued and the patient returned for a colposcopic evaluation of the cervix with biopsies (due to the presence of high risk HPV).  This took place on 06/17/2021.  At the time of that colposcopic evaluation there was some abnormal bleeding noted and friability to the cervix.  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   05/20/2021 Pathology Results   FINAL MICROSCOPIC DIAGNOSIS:   A. ENDOMETRIUM, BIOPSY:  - Benign inactive atrophic endometrium  - Negative for hyperplasia or malignancy   05/30/2021 Imaging   US pelvis Small anteverted uterus Thin, symmetrical endometrium Normal ovaries bilaterally No free fluid.   06/17/2021 Pathology Results   FINAL MICROSCOPIC DIAGNOSIS:   A. CERVIX, 6 O'CLOCK, BIOPSY:  -  Invasive squamous cell carcinoma  -  See comment   B. ENDOCERVIX, CURETTAGE:  -  Squamous cell carcinoma  -  See comment   COMMENT:   A.  The carcinoma is positive for p16 and p40 supporting the diagnosis  of invasive squamous cell carcinoma.     07/11/2021 PET scan   Intense hypermetabolic activity associated with ill-defined nodular enlargement of the cervix/lower uterine segment, consistent with patient's biopsy proven cervical neoplasm.   No definite scintigraphic evidence of metastatic disease in the neck, chest, abdomen or pelvis.   Right middle lobe 4-5 mm perifissural pulmonary nodule unchanged in size from October 25, 2020 and below the resolution of PET-CT but favored to reflect a peri- fissural lymph node, attention on follow-up studies suggested   Nodular focus of hypermetabolic activity along the right anterior sublingual region, which is nonspecific correlation with direct visualization is suggested.   Hypermetabolic hyperplasia of the tonsils with a hypermetabolic 5 mm left level 1b cervical lymph node, favored reactive.   Hypermetabolic activity associated with callus formation in the right anterior sixth rib likely representing a healing nonpathologic rib fracture.   07/19/2021 Initial Diagnosis   Malignant neoplasm of cervix (HCC)   07/23/2021 Cancer Staging   Staging form: Cervix Uteri, AJCC Version 9 - Clinical stage from 07/23/2021: FIGO Stage IB3 (cT1b3, cN0, cM0) - Signed by Artis Delay, MD on 07/23/2021 Stage prefix: Initial diagnosis   07/25/2021 Imaging   1. No acute intracranial abnormality. 2. Minimal multifocal hyperintense T2-weight signal within the white matter, nonspecific. This may be seen in the setting of chronic small vessel disease or migraine headaches.     07/25/2021 Imaging   CT head No acute intracranial abnormality.   07/30/2021 Procedure   Successful placement of a power injectable Port-A-Cath via the right internal jugular vein. The catheter is ready for immediate  use.     08/02/2021 - 08/30/2021 Chemotherapy   Patient is on Treatment Plan : Cervical Cisplatin q7d     10/24/2021 Surgery   TRH/BSO  Findings: On EUA, small mobile uterus. What  appears to be residual tumor (approximately 1cm) replaces the cervical os. On intra-abdominal entry, normal upper abdominal survey. Normal omentum, small and large bowel. Uterus 6cm, normal appearing adnexa. Mild retroperitoneal edema and fibrosis given recent RT. Moderate adhesions between bladder and cervix. Good visual margin taken around the cervix while trying to preserve as much vaginal length as possible. No intra-abdominal or pelvic evidence of disease.   10/24/2021 Pathology Results   A. UTERUS, CERVIX, BILATERAL FALLOPIAN TUBES AND OVARIES:  - Invasive adenosquamous carcinoma of the cervix, see comment  - Carcinoma invades for depth of 0.6 cm  - Resection margins are negative for carcinoma  - Endometriosis of the cervix  - Uterus with benign inactive endometrium  - Benign unremarkable bilateral fallopian tubes and ovaries  - See oncology table      COMMENT:   Most of the tumor shows a squamous morphology with patchy areas of  glandular differentiation. Immunostains show that the tumor cells are  positive for p16, CK5/6 and show evidence of mucin production with  mucicarmine special stain.  Immunostain for p63 is negative.  This  immunoprofile is consistent with above interpretation.     ONCOLOGY TABLE:   UTERINE CERVIX, CARCINOMA: Resection   Procedure: Total hysterectomy and bilateral salpingo-oophorectomy  Tumor Size: 0.8 cm (as measured on HE glass slide)  Histologic Type: Adenosquamous carcinoma  Histologic Grade: G3: Poorly differentiated  Stromal Invasion: Present       Depth of stromal invasion (mm): 6 mm  Other Tissue/ Organ: Not applicable  Margins: All margins negative for invasive carcinoma  Margin Status for HSIL or AIS: All margins negative for HSIL or AIS  Lymphovascular invasion: Not identified       Regional Lymph Nodes: Not applicable (no lymph nodes submitted or  found)  Distant Metastasis:       Distant sites involved: Not applicable  Pathologic  Stage Classification (pTNM, AJCC 8th Edition): pT1b1, pN not  assigned  Ancillary Studies: Can be performed upon request  Representative Tumor Block: A6    01/06/2022 Procedure   Removal of implanted Port-A-Cath utilizing sharp and blunt dissection. The procedure was uncomplicated.     02/13/2022 Imaging   CT A/P: 1. No evidence of metastatic disease in the abdomen or pelvis. 2. Small hiatal hernia. 3. Congenital small bowel malrotation.  No acute abnormality.     Interval History: Doing well.  Denies any vaginal bleeding or discharge.  Denies any abdominal or pelvic pain.  Reports normal bowel and bladder function.  Is struggling somewhat with her epilepsy diagnosis and making sure that she is getting plenty of sleep and eating well.  She is helping to care for her mother who recently fell and broke her hip.  Past Medical/Surgical History: Past Medical History:  Diagnosis Date   Anxiety    Arthritis    Arthritis of ankle    Right   Cervical cancer (HCC) 2020   Depression    Episode of loss of consciousness    History of radiation therapy    Pelvis 07/31/2021-09/03/2021  HDR tandem and ring 09/26/2021  Dr Antony Blackbird   Hypothyroidism    Insomnia    Pure hypercholesterolemia    Seizure [facility])     Past Surgical History:  Procedure Laterality Date  ABDOMINAL HYSTERECTOMY     ANKLE SURGERY Right    [facility]   IR IMAGING GUIDED PORT INSERTION  07/30/2021   IR REMOVAL TUN ACCESS W/ PORT W/O FL MOD SED  01/03/2022   OPERATIVE ULTRASOUND N/A 09/26/2021   Procedure: OPERATIVE ULTRASOUND;  Surgeon: Antony Blackbird, MD;  Location: [facility];  Service: Urology;  Laterality: N/A;   ROBOTIC ASSISTED TOTAL HYSTERECTOMY WITH BILATERAL SALPINGO OOPHERECTOMY Bilateral 10/24/2021   Procedure: XI ROBOTIC ASSISTED TOTAL HYSTERECTOMY WITH BILATERAL SALPINGO OOPHORECTOMY;  Surgeon: Carver Fila, MD;  Location: [facility];  Service: Gynecology;  Laterality: Bilateral;   TANDEM RING INSERTION N/A  09/26/2021   Procedure: TANDEM RING INSERTION;  Surgeon: Antony Blackbird, MD;  Location: [facility];  Service: Urology;  Laterality: N/A;   WISDOM TOOTH EXTRACTION      Family History  Problem Relation Age of Onset   Colon polyps Mother    Heart attack Father    Diabetes Brother    Pancreatic cancer Maternal Grandmother    Sudden death Neg Hx    Hypertension Neg Hx    Breast cancer Neg Hx    Colon cancer Neg Hx     Social History   Socioeconomic History   Marital status: Married    Spouse name: Not on file   Number of children: 2   Years of education: Not on file   Highest education level: Not on file  Occupational History   Not on file  Tobacco Use   Smoking status: Former    Packs/day: 0.25    Years: 25.00    Additional pack years: 0.00    Total pack years: 6.25    Types: Cigarettes    Quit date: 2016    Years since quitting: 8.3   Smokeless tobacco: Never   Tobacco comments:    20+ yrs  Vaping Use   Vaping Use: Never used  Substance and Sexual Activity   Alcohol use: Yes    Comment: 2 glasses of wine a day   Drug use: Yes    Frequency: 7.0 times per week    Types: Marijuana    Comment: Last use 07-10-21   Sexual activity: Not Currently    Birth control/protection: Post-menopausal  Other Topics Concern   Not on file  Social History Narrative   Lives alone   R handed   Caffeine: 1 C of coffee a day   Social Determinants of Corporate investment banker Strain: Not on file  Food Insecurity: Not on file  Transportation Needs: Not on file  Physical Activity: Not on file  Stress: Not on file  Social Connections: Not on file    Current Medications:  Current Outpatient Medications:    ALPRAZolam (XANAX) 0.5 MG tablet, TAKE ONE TABLET BY MOUTH  AT BEDTIME AS NEEDED FOR ANXIETY, Disp: 90 tablet, Rfl: 1   Brimonidine Tartrate (LUMIFY) 0.025 % SOLN, Place 1 drop into both eyes daily as needed (red eyes (avg 4x/week))., Disp: , Rfl:    Coenzyme Q10 (COQ10) 200 MG  CAPS, Take 200 mg by mouth at bedtime., Disp: , Rfl:    FLUoxetine (PROZAC) 20 MG capsule, Take 3 capsules (60 mg total) by mouth every morning., Disp: 90 capsule, Rfl: 5   ibuprofen (ADVIL) 200 MG tablet, Take 400 mg by mouth every 8 (eight) hours as needed (pain.)., Disp: , Rfl:    lamoTRIgine (LAMICTAL) 100 MG tablet, Take 1 tablet (100 mg total) by mouth 2 (two) times daily., Disp: 180  tablet, Rfl: 1   levETIRAcetam (KEPPRA) 500 MG tablet, Take 1 tablet (500 mg total) by mouth 2 (two) times daily., Disp: 180 tablet, Rfl: 1   levothyroxine (SYNTHROID) 50 MCG tablet, Take 1 tablet (50 mcg total) by mouth daily before breakfast., Disp: 90 tablet, Rfl: 0   Liniments (DEEP BLUE RELIEF EX), Apply 1 Application topically 3 (three) times daily as needed (pain.). doTERRA Deep Blue Rub, Disp: , Rfl:    Midazolam (NAYZILAM) 5 MG/0.1ML SOLN, Place 5 mg into the nose as needed., Disp: 2 each, Rfl: 2   prenatal vitamin w/FE, FA (PRENATAL 1 + 1) 27-1 MG TABS tablet, Take 1 tablet by mouth 2 (two) times a week., Disp: , Rfl:    rosuvastatin (CRESTOR) 10 MG tablet, Take 1 tablet (10 mg total) by mouth daily. Please make overdue appt with Dr. Shari Prows before anymore refills. Thank you 1st attempt (Patient taking differently: Take 10 mg by mouth at bedtime. Please make overdue appt with Dr. Shari Prows before anymore refills. Thank you 1st attempt), Disp: 90 tablet, Rfl: 3   zolpidem (AMBIEN) 10 MG tablet, Take 1 tablet (10 mg total) by mouth at bedtime as needed., Disp: 30 tablet, Rfl: 1   ibuprofen (ADVIL) 800 MG tablet, Take 800 mg by mouth every 8 (eight) hours as needed (severe pain.). (Patient not taking: Reported on 03/06/2023), Disp: , Rfl:   Review of Systems: + Seizures, easy bruising/bleeding, anxiety, depression, decreased concentration. Denies appetite changes, fevers, chills, fatigue, unexplained weight changes. Denies hearing loss, neck lumps or masses, mouth sores, ringing in ears or voice  changes. Denies cough or wheezing.  Denies shortness of breath. Denies chest pain or palpitations. Denies leg swelling. Denies abdominal distention, pain, blood in stools, constipation, diarrhea, nausea, vomiting, or early satiety. Denies pain with intercourse, dysuria, frequency, hematuria or incontinence. Denies hot flashes, pelvic pain, vaginal bleeding or vaginal discharge.   Denies joint pain, back pain or muscle pain/cramps. Denies itching, rash, or wounds. Denies dizziness, headaches, numbness or seizures. Denies swollen lymph nodes or glands.  Physical Exam: BP 120/67 (BP Location: Left Arm, Patient Position: Sitting)   Pulse 88   Temp 99 F (37.2 C) (Oral)   Wt 171 lb 3.2 oz (77.7 kg)   SpO2 97%   BMI 26.03 kg/m  General: Alert, oriented, no acute distress. HEENT: Normocephalic, atraumatic, sclera anicteric. Chest: Clear to auscultation bilaterally.  No wheezes or rhonchi. Cardiovascular: Regular rate and rhythm, no murmurs. Abdomen: soft, nontender.  Normoactive bowel sounds.  No masses or hepatosplenomegaly appreciated.  Well-healed incisions, suture excised from to right lateral incisions. Extremities: Grossly normal range of motion.  Warm, well perfused.  No edema bilaterally. Skin: No rashes or lesions noted. GU: Normal appearing external genitalia without erythema, excoriation, or lesions.  Speculum exam reveals cuff intact, no visible lesions or masses.  No bleeding or discharge.  Moderate atrophy noted, minimal adhesions at apex.  On bimanual exam, cuff is smooth, no nodularity.  This is confirmed on rectovaginal exam.  Laboratory & Radiologic Studies: 08/2022: Vaginal pap NIML, HR HPV negative  Assessment & Plan: Ashley Hammond is a 57 y.o. woman with a history of Stage IB3 SCC of the cervix status post completion surgery after pelvic radiation and one HDR treatment who presents for surveillance visit. Interval surgery 09/2021. CPS 0%   Patient is doing  well from a gynecologic cancer standpoint and is NED on exam today.  Patient has been using her vaginal dilator each times she showers.  She was encouraged to continue doing this regularly.   Per NCCN surveillance recommendations, the patient will continue with follow-up visits every 3 months alternating between my office and radiation oncology.  She is scheduled with Dr. Roselind Messier in August. She will see me back in 6 months.  We will plan on yearly cotesting, due 08/2023.   If she were develop concerning symptoms or have findings on exam that were suspicious for recurrence, I stressed the importance of calling to let me know.    20 minutes of total time was spent for this patient encounter, including preparation, face-to-face counseling with the patient and coordination of care, and documentation of the encounter.  Eugene Garnet, MD  Division of Gynecologic Oncology  Department of Obstetrics and Gynecology  [facility]

## 2023-03-11 ENCOUNTER — Other Ambulatory Visit: Payer: Self-pay | Admitting: Internal Medicine

## 2023-03-11 ENCOUNTER — Other Ambulatory Visit: Payer: Self-pay

## 2023-03-13 ENCOUNTER — Other Ambulatory Visit (HOSPITAL_COMMUNITY): Payer: Self-pay

## 2023-03-13 ENCOUNTER — Telehealth: Payer: Self-pay | Admitting: Internal Medicine

## 2023-03-13 ENCOUNTER — Other Ambulatory Visit: Payer: Self-pay

## 2023-03-13 MED ORDER — LEVOTHYROXINE SODIUM 50 MCG PO TABS
50.0000 ug | ORAL_TABLET | Freq: Every day | ORAL | 0 refills | Status: DC
  Filled 2023-03-13: qty 30, 30d supply, fill #0
  Filled 2023-04-05: qty 30, 30d supply, fill #1
  Filled 2023-05-01: qty 30, 30d supply, fill #2

## 2023-03-13 MED ORDER — ROSUVASTATIN CALCIUM 10 MG PO TABS
10.0000 mg | ORAL_TABLET | Freq: Every day | ORAL | 0 refills | Status: DC
  Filled 2023-03-13: qty 90, 90d supply, fill #0
  Filled 2023-05-01: qty 30, 30d supply, fill #0
  Filled 2023-05-31: qty 30, 30d supply, fill #1
  Filled 2023-06-26: qty 30, 30d supply, fill #2

## 2023-03-13 NOTE — Telephone Encounter (Signed)
I called and schedule Ashley Hammond for an appointment about all of her medications and her 6 month follow up on 04/10/2023, she is taking care of her month, and is also going out of town for a week.leaving Saturday.She is completely out of her thyroid medication. Can we refill her medication to get her thru until then and do her labs.prior to appointment.  levothyroxine (SYNTHROID) 50 MCG tablet  rosuvastatin (CRESTOR) 10 MG tablet

## 2023-03-13 NOTE — Telephone Encounter (Signed)
Rx sent to the pharmacy.

## 2023-04-03 NOTE — Progress Notes (Addendum)
Patient Care Team: Margaree Mackintosh, MD as PCP - General (Internal Medicine) Meriam Sprague, MD as PCP - Cardiology (Cardiology)  Visit Date: 04/10/23  Subjective:    Patient ID: Ashley Hammond , Female   DOB: [birth year], 57 y.o.    MRN: [id number]   57 y.o. Female presents today for TSH check. History of hypothyroidism treated with levothyroxine 50 mcg daily before breakfast. TSH at 1.38.  Situational stress continues with mother's poor health and husband. 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  History of epilepsy treated with Keppra, Lamictal per Dr. Vickey Huger. Had an episode of scent aura with no seizure three weeks ago while in bed at her mother's house. She was having increased stress at the time. Has not been driving.  Past Medical History:  Diagnosis Date   Anxiety    Arthritis    Arthritis of ankle    Right   Cervical cancer (HCC) 2020   Depression    Episode of loss of consciousness    History of radiation therapy    Pelvis 07/31/2021-09/03/2021  HDR tandem and ring 09/26/2021  Dr Antony Blackbird   Hypothyroidism    Insomnia    Pure hypercholesterolemia    Seizure (HCC)      Family History  Problem Relation Age of Onset   Colon polyps Mother    Heart attack Father    Diabetes Brother    Pancreatic cancer Maternal Grandmother    Sudden death Neg Hx    Hypertension Neg Hx    Breast cancer Neg Hx    Colon cancer Neg Hx     Social History   Social History Narrative   Lives alone   R handed   Caffeine: 1 C of coffee a day      Review of Systems  Constitutional:  Negative for fever and malaise/fatigue.  HENT:  Negative for congestion.   Eyes:  Negative for blurred vision.  Respiratory:  Negative for cough and shortness of breath.   Cardiovascular:  Negative for chest pain, palpitations and leg swelling.  Gastrointestinal:  Negative for vomiting.  Musculoskeletal:  Negative for back pain.  Skin:  Negative for rash.  Neurological:  Negative  for loss of consciousness and headaches.  Psychiatric/Behavioral:  The patient is nervous/anxious.         Objective:   Vitals: BP 126/80   Pulse 78   Temp 98.4 F (36.9 C) (Tympanic)   Resp 14   Ht 5\' 8"  (1.727 m)   Wt 168 lb 8 oz (76.4 kg)   SpO2 98%   BMI 25.62 kg/m    Physical Exam Vitals and nursing note reviewed.  Constitutional:      General: She is not in acute distress.    Appearance: Normal appearance. She is not toxic-appearing.  HENT:     Head: Normocephalic and atraumatic.  Neck:     Thyroid: No thyroid mass, thyromegaly or thyroid tenderness.  Cardiovascular:     Rate and Rhythm: Normal rate and regular rhythm. No extrasystoles are present.    Pulses: Normal pulses.     Heart sounds: Normal heart sounds. No murmur heard.    No friction rub. No gallop.  Pulmonary:     Effort: Pulmonary effort is normal. No respiratory distress.     Breath sounds: Normal breath sounds. No wheezing or rales.  Skin:    General: Skin is warm and dry.  Neurological:     General: No focal  deficit present.     Mental Status: She is alert and oriented to person, place, and time. Mental status is at baseline.  Psychiatric:        Mood and Affect: Mood normal.        Behavior: Behavior normal.        Thought Content: Thought content normal.        Judgment: Judgment normal.       Results:   Studies obtained and personally reviewed by me:   Labs:       Component Value Date/Time   NA 142 07/03/2022 1157   K 5.1 07/03/2022 1157   CL 106 07/03/2022 1157   CO2 31 07/03/2022 1157   GLUCOSE 88 07/03/2022 1157   BUN 12 07/03/2022 1157   CREATININE 0.85 07/03/2022 1157   CALCIUM 9.8 07/03/2022 1157   PROT 6.3 07/03/2022 1157   ALBUMIN 4.5 07/25/2021 1725   AST 18 07/03/2022 1157   ALT 12 07/03/2022 1157   ALKPHOS 70 07/25/2021 1725   BILITOT 0.4 07/03/2022 1157   GFRNONAA >60 05/20/2022 2222   GFRNONAA >60 08/28/2021 1441   GFRNONAA 80 09/12/2020 1200   GFRAA 93  09/12/2020 1200     Lab Results  Component Value Date   WBC 3.8 07/03/2022   HGB 13.4 07/03/2022   HCT 39.3 07/03/2022   MCV 99.5 07/03/2022   PLT 207 07/03/2022    Lab Results  Component Value Date   CHOL 253 (H) 07/03/2022   HDL 81 07/03/2022   LDLCALC 153 (H) 07/03/2022   TRIG 83 07/03/2022   CHOLHDL 3.1 07/03/2022    No results found for: "HGBA1C"   Lab Results  Component Value Date   TSH 1.38 04/09/2023     Assessment & Plan:   Hypothyroidism: Continue levothyroxine 50 mcg daily before breakfast. TSH is excellent at 1.38.  Hx of Seizure disorder seen by Neurology. Not able to drive due to this disorder.Is on Keppra  and Lamictal followed by Dr. Vickey Huger.  Hx SCC of cervix followed by GYN Oncology.  Hyperlipidemia (pure hypercholesterolemia) take Crestor 10 mg daily  Depression seen by Ellis Savage treated with Prozac  Situational stress  Plan: She will follow up her in September for health maintenance exam  Return in September for health maintenance exam and fasting labs. Has GYN care at [facility]. Take Crestor. Follow up in September- will have CPE here then    I,Alexander Ruley,acting as a scribe for Margaree Mackintosh, MD.,have documented all relevant documentation on the behalf of Margaree Mackintosh, MD,as directed by  Margaree Mackintosh, MD while in the presence of Margaree Mackintosh, MD.   I, Margaree Mackintosh, MD, have reviewed all documentation for this visit. The documentation on 04/22/23 for the exam, diagnosis, procedures, and orders are all accurate and complete.

## 2023-04-06 ENCOUNTER — Other Ambulatory Visit: Payer: Self-pay

## 2023-04-09 ENCOUNTER — Other Ambulatory Visit: Payer: 59

## 2023-04-09 DIAGNOSIS — E039 Hypothyroidism, unspecified: Secondary | ICD-10-CM

## 2023-04-10 ENCOUNTER — Ambulatory Visit (INDEPENDENT_AMBULATORY_CARE_PROVIDER_SITE_OTHER): Payer: 59 | Admitting: Internal Medicine

## 2023-04-10 ENCOUNTER — Encounter: Payer: Self-pay | Admitting: Internal Medicine

## 2023-04-10 VITALS — BP 126/80 | HR 78 | Temp 98.4°F | Resp 14 | Ht 68.0 in | Wt 168.5 lb

## 2023-04-10 DIAGNOSIS — F419 Anxiety disorder, unspecified: Secondary | ICD-10-CM | POA: Diagnosis not present

## 2023-04-10 DIAGNOSIS — C539 Malignant neoplasm of cervix uteri, unspecified: Secondary | ICD-10-CM

## 2023-04-10 DIAGNOSIS — F32A Depression, unspecified: Secondary | ICD-10-CM

## 2023-04-10 DIAGNOSIS — G40909 Epilepsy, unspecified, not intractable, without status epilepticus: Secondary | ICD-10-CM | POA: Diagnosis not present

## 2023-04-10 DIAGNOSIS — F5101 Primary insomnia: Secondary | ICD-10-CM | POA: Diagnosis not present

## 2023-04-10 DIAGNOSIS — E7800 Pure hypercholesterolemia, unspecified: Secondary | ICD-10-CM | POA: Diagnosis not present

## 2023-04-10 DIAGNOSIS — E039 Hypothyroidism, unspecified: Secondary | ICD-10-CM | POA: Diagnosis not present

## 2023-04-10 LAB — TSH: TSH: 1.38 mIU/L (ref 0.40–4.50)

## 2023-04-22 NOTE — Patient Instructions (Addendum)
TSH is normal on levothyroxine 50 mcg daily.  Continue same dose and follow-up in September for health maintenance exam.  Continue follow-up with Dr. Vickey Huger, neurologist for seizure disorder.  Continue counseling with Ellis Savage and treatment with Prozac.  Take Crestor 10 mg daily for hyperlipidemia.  Continue follow-up with GYN oncology for history of cervical cancer.

## 2023-05-01 ENCOUNTER — Other Ambulatory Visit (HOSPITAL_COMMUNITY): Payer: Self-pay

## 2023-05-01 ENCOUNTER — Encounter (HOSPITAL_COMMUNITY): Payer: Self-pay

## 2023-05-01 ENCOUNTER — Other Ambulatory Visit: Payer: Self-pay

## 2023-05-01 ENCOUNTER — Other Ambulatory Visit: Payer: Self-pay | Admitting: Internal Medicine

## 2023-05-01 MED ORDER — ZOLPIDEM TARTRATE 10 MG PO TABS
10.0000 mg | ORAL_TABLET | Freq: Every evening | ORAL | 1 refills | Status: DC | PRN
  Filled 2023-05-01: qty 30, 30d supply, fill #0

## 2023-05-03 ENCOUNTER — Other Ambulatory Visit (HOSPITAL_COMMUNITY): Payer: Self-pay

## 2023-05-04 ENCOUNTER — Other Ambulatory Visit: Payer: Self-pay

## 2023-05-04 ENCOUNTER — Other Ambulatory Visit (HOSPITAL_COMMUNITY): Payer: Self-pay

## 2023-05-05 ENCOUNTER — Other Ambulatory Visit (HOSPITAL_COMMUNITY): Payer: Self-pay

## 2023-05-05 ENCOUNTER — Other Ambulatory Visit: Payer: Self-pay | Admitting: Neurology

## 2023-05-06 ENCOUNTER — Other Ambulatory Visit (HOSPITAL_COMMUNITY): Payer: Self-pay

## 2023-05-06 ENCOUNTER — Other Ambulatory Visit: Payer: Self-pay

## 2023-05-06 MED ORDER — FLUOXETINE HCL 20 MG PO CAPS
60.0000 mg | ORAL_CAPSULE | Freq: Every morning | ORAL | 2 refills | Status: DC
  Filled 2023-05-06: qty 90, 30d supply, fill #0
  Filled 2023-05-31: qty 90, 30d supply, fill #1
  Filled 2023-06-30: qty 90, 30d supply, fill #2

## 2023-05-29 ENCOUNTER — Telehealth: Payer: Self-pay | Admitting: Internal Medicine

## 2023-05-29 DIAGNOSIS — F419 Anxiety disorder, unspecified: Secondary | ICD-10-CM

## 2023-05-29 MED ORDER — ALPRAZOLAM 0.5 MG PO TABS: 0.5000 mg | ORAL_TABLET | Freq: Two times a day (BID) | ORAL | 0 refills | Status: DC | PRN

## 2023-05-29 NOTE — Telephone Encounter (Signed)
The last time she got xanax from you was 09/2021, yes she will need a refill

## 2023-05-29 NOTE — Telephone Encounter (Signed)
Xanax 0.5 mg # 30 one po twice daily as needed.Use sparingly. Sent to pharmacy, MJB, MD

## 2023-05-31 NOTE — Progress Notes (Signed)
Radiation Oncology         [phone]) [phone] ________________________________  Name: Ashley Hammond MRN: [id number]  Date: 06/01/2023  DOB: 03-Jun-1966  Follow-Up Visit Note  CC: Margaree Mackintosh, MD  Adolphus Birchwood, MD  No diagnosis found.  Diagnosis: The encounter diagnosis was Malignant neoplasm of cervix, unspecified site [facility]).   Squamous cell carcinoma of the cervix, Clinical Stage IB3  Interval Since Last Radiation: 1 year, 8 months, and 4 days  Intent: Curative  Radiation Treatment Dates: 07/31/2021 through 09/26/2021 Site Technique Total Dose (Gy) Dose per Fx (Gy) Completed Fx Beam Energies  Pelvis: Pelvis IMRT 45/45 1.8 25/25 15X  Cervix: Cervix_Bst_Fx1 HDR-brachy 5.5/5.5 5.5 1/1 Ir-192   Narrative:  The patient returns today for routine follow-up. She was last seen here for follow-up on 12/01/22.    Since her last visit, the patient followed up with Dr. Pricilla Holm on 03/06/23. During which time, the patient denied any symptoms concerning for disease recurrence and she was noted as NED on examination.        No other significant oncologic interval history since the patient was last seen for follow-up.   ***                        Allergies:  has No Known Allergies.  Meds: Current Outpatient Medications  Medication Sig Dispense Refill   ALPRAZolam (XANAX) 0.5 MG tablet Take 1 tablet (0.5 mg total) by mouth 2 (two) times daily as needed for anxiety. 30 tablet 0   Brimonidine Tartrate (LUMIFY) 0.025 % SOLN Place 1 drop into both eyes daily as needed (red eyes (avg 4x/week)).     FLUoxetine (PROZAC) 20 MG capsule Take 3 capsules (60 mg total) by mouth in the morning. 90 capsule 2   ibuprofen (ADVIL) 200 MG tablet Take 400 mg by mouth every 8 (eight) hours as needed (pain.).     ibuprofen (ADVIL) 800 MG tablet Take 800 mg by mouth every 8 (eight) hours as needed (severe pain.). (Patient not taking: Reported on 03/06/2023)     lamoTRIgine (LAMICTAL) 100 MG tablet Take 1 tablet (100  mg total) by mouth 2 (two) times daily. 180 tablet 1   levETIRAcetam (KEPPRA) 500 MG tablet Take 1 tablet (500 mg total) by mouth 2 (two) times daily. 180 tablet 1   levothyroxine (SYNTHROID) 50 MCG tablet Take 1 tablet (50 mcg total) by mouth daily before breakfast. 90 tablet 0   Liniments (DEEP BLUE RELIEF EX) Apply 1 Application topically 3 (three) times daily as needed (pain.). doTERRA Deep Blue Rub     Midazolam (NAYZILAM) 5 MG/0.1ML SOLN Place 5 mg into the nose as needed. 2 each 2   prenatal vitamin w/FE, FA (PRENATAL 1 + 1) 27-1 MG TABS tablet Take 1 tablet by mouth 2 (two) times a week.     rosuvastatin (CRESTOR) 10 MG tablet Take 1 tablet (10 mg total) by mouth daily. Please make overdue appt with Dr. Shari Prows before anymore refills. 90 tablet 0   zolpidem (AMBIEN) 10 MG tablet Take 1 tablet (10 mg total) by mouth at bedtime as needed. 30 tablet 1   No current facility-administered medications for this encounter.    Physical Findings: The patient is in no acute distress. Patient is alert and oriented.  vitals were not taken for this visit. .  No significant changes. Lungs are clear to auscultation bilaterally. Heart has regular rate and rhythm. No palpable cervical, supraclavicular, or axillary adenopathy.  Abdomen soft, non-tender, normal bowel sounds.  On pelvic examination the external genitalia were unremarkable. A speculum exam was performed. There are no mucosal lesions noted in the vaginal vault. A Pap smear was obtained of the proximal vagina. On bimanual and rectovaginal examination there were no pelvic masses appreciated. ***   Lab Findings: Lab Results  Component Value Date   WBC 3.8 07/03/2022   HGB 13.4 07/03/2022   HCT 39.3 07/03/2022   MCV 99.5 07/03/2022   PLT 207 07/03/2022    Radiographic Findings: No results found.  Impression: The encounter diagnosis was Malignant neoplasm of cervix, unspecified site [facility]).   Squamous cell carcinoma of the cervix,  Clinical Stage IB3  The patient is recovering from the effects of radiation.  ***  Plan:  ***   *** minutes of total time was spent for this patient encounter, including preparation, face-to-face counseling with the patient and coordination of care, physical exam, and documentation of the encounter. ____________________________________  Billie Lade, PhD, MD  This document serves as a record of services personally performed by Antony Blackbird, MD. It was created on his behalf by Neena Rhymes, a trained medical scribe. The creation of this record is based on the scribe's personal observations and the provider's statements to them. This document has been checked and approved by the attending provider.

## 2023-06-01 ENCOUNTER — Ambulatory Visit
Admission: RE | Admit: 2023-06-01 | Discharge: 2023-06-01 | Disposition: A | Discharge status: Home / Self Care | Payer: 59 | Source: Ambulatory Visit | Attending: Radiation Oncology | Admitting: Radiation Oncology

## 2023-06-01 ENCOUNTER — Encounter: Payer: Self-pay | Admitting: Radiation Oncology

## 2023-06-01 ENCOUNTER — Other Ambulatory Visit: Payer: Self-pay

## 2023-06-01 VITALS — BP 103/71 | HR 79 | Temp 98.0°F | Resp 20 | Ht 68.0 in | Wt 174.4 lb

## 2023-06-01 DIAGNOSIS — Z90710 Acquired absence of both cervix and uterus: Secondary | ICD-10-CM | POA: Diagnosis not present

## 2023-06-01 DIAGNOSIS — Z923 Personal history of irradiation: Secondary | ICD-10-CM | POA: Insufficient documentation

## 2023-06-01 DIAGNOSIS — Z90722 Acquired absence of ovaries, bilateral: Secondary | ICD-10-CM | POA: Insufficient documentation

## 2023-06-01 DIAGNOSIS — Z8541 Personal history of malignant neoplasm of cervix uteri: Secondary | ICD-10-CM | POA: Diagnosis not present

## 2023-06-01 DIAGNOSIS — C539 Malignant neoplasm of cervix uteri, unspecified: Secondary | ICD-10-CM

## 2023-06-01 DIAGNOSIS — Z79899 Other long term (current) drug therapy: Secondary | ICD-10-CM | POA: Diagnosis not present

## 2023-06-01 DIAGNOSIS — Z79890 Hormone replacement therapy (postmenopausal): Secondary | ICD-10-CM | POA: Insufficient documentation

## 2023-06-01 NOTE — Progress Notes (Signed)
Ashley Hammond is here today for follow up post radiation to the pelvic.  They completed their radiation on:    Does the patient complain of any of the following:  Pain: No Abdominal bloating: No Diarrhea/Constipation: No Nausea/Vomiting: Nausea when she has seizures. Vaginal Discharge: No Blood in Urine or Stool: No Urinary Issues (dysuria/incomplete emptying/ incontinence/ increased frequency/urgency): No Does patient report using vaginal dilator 2-3 times a week and/or sexually active 2-3 weeks: Yes, using dilators.  Post radiation skin changes: No   Additional comments if applicable:    BP 103/71 (BP Location: Left Arm, Patient Position: Sitting, Cuff Size: Large)   Pulse 79   Temp 98 F (36.7 C)   Resp 20   Ht 5\' 8"  (1.727 m)   Wt 174 lb 6.4 oz (79.1 kg)   SpO2 98%   BMI 26.52 kg/m

## 2023-06-16 ENCOUNTER — Telehealth: Payer: 59 | Admitting: Emergency Medicine

## 2023-06-16 ENCOUNTER — Other Ambulatory Visit (HOSPITAL_COMMUNITY): Payer: Self-pay

## 2023-06-16 DIAGNOSIS — U071 COVID-19: Secondary | ICD-10-CM | POA: Diagnosis not present

## 2023-06-16 MED ORDER — AZELASTINE HCL 0.1 % NA SOLN
2.0000 | Freq: Two times a day (BID) | NASAL | 0 refills | Status: DC
  Filled 2023-06-16: qty 30, 30d supply, fill #0

## 2023-06-16 MED ORDER — BENZONATATE 100 MG PO CAPS
100.0000 mg | ORAL_CAPSULE | Freq: Two times a day (BID) | ORAL | 0 refills | Status: DC | PRN
  Filled 2023-06-16: qty 20, 10d supply, fill #0

## 2023-06-16 NOTE — Progress Notes (Signed)
E-Visit  for Positive Covid Test Result   We are sorry you are not feeling well. We are here to help!  You have tested positive for COVID-19, meaning that you were infected with the novel coronavirus and could give the virus to others.  Most people with COVID-19 have mild illness and can recover at home without medical care. Do not leave your home, except to get medical care. Do not visit public areas and do not go to places where you are unable to wear a mask. It is important that you stay home  to take care for yourself and to help protect other people in your home and community.      Isolation Instructions:   You are to isolate at home until you have been fever free for at least 24 hours without a fever-reducing medication, and symptoms have been steadily improving for 24 hours. At that time, you can end isolation but need to mask for an additional 5 days.  If you must be around other household members who do not have symptoms, you need to make sure that both you and the family members are masking consistently with a high-quality mask.  If you note any worsening of symptoms despite treatment, please seek an in-person evaluation ASAP. If you note any significant shortness of breath or any chest pain, please seek ER evaluation. Please do not delay care!   Go to the nearest hospital ED for assessment if fever/cough/breathlessness are severe or illness seems like a threat to life.    The following symptoms may appear 2-14 days after exposure: Fever Cough Shortness of breath or difficulty breathing Chills Repeated shaking with chills Muscle pain Headache Sore throat New loss of taste or smell Fatigue Congestion or runny nose Nausea or vomiting Diarrhea  You can use medication such as prescription cough medication called Tessalon Perles 100 mg. You may take 1-2 capsules every 8 hours as needed for cough and prescription for Azelastine nasal spray 2 sprays in each nostril twice per  day  You may also take acetaminophen (Tylenol) as needed for fever.  HOME CARE: Only take medications as instructed by your medical team. Drink plenty of fluids and get plenty of rest. A steam or ultrasonic humidifier can help if you have congestion.   GET HELP RIGHT AWAY IF YOU HAVE EMERGENCY WARNING SIGNS.  Call 911 or proceed to your closest emergency facility if: You develop worsening high fever. Trouble breathing Bluish lips or face Persistent pain or pressure in the chest New confusion Inability to wake or stay awake You cough up blood. Your symptoms become more severe Inability to hold down food or fluids  This list is not all possible symptoms. Contact your medical provider for any symptoms that are severe or concerning to you.   Your e-visit answers were reviewed by a board certified advanced clinical practitioner to complete your personal care plan.  Depending on the condition, your plan could have included both over the counter or prescription medications.  If there is a problem please reply once you have received a response from your provider.  Your safety is important to [place].  If you have drug allergies check your prescription carefully.    You can use MyChart to ask questions about today's visit, request a non-urgent call back, or ask for a work or school excuse for 24 hours related to this e-Visit. If it has been greater than 24 hours you will need to follow up with your provider, or  enter a new e-Visit to address those concerns. You will get an e-mail in the next two days asking about your experience.  I hope that your e-visit has been valuable and will speed your recovery. Thank you for using e-visits.  I have spent 5 minutes in review of e-visit questionnaire, review and updating patient chart, medical decision making and response to patient.   Rica Mast, PhD, FNP-BC

## 2023-06-17 ENCOUNTER — Other Ambulatory Visit: Payer: Self-pay

## 2023-06-26 ENCOUNTER — Other Ambulatory Visit: Payer: Self-pay

## 2023-06-26 ENCOUNTER — Other Ambulatory Visit: Payer: Self-pay | Admitting: Internal Medicine

## 2023-06-26 MED ORDER — LEVOTHYROXINE SODIUM 50 MCG PO TABS
50.0000 ug | ORAL_TABLET | Freq: Every day | ORAL | 0 refills | Status: DC
  Filled 2023-06-26: qty 30, 30d supply, fill #0
  Filled 2023-07-30: qty 30, 30d supply, fill #1
  Filled 2023-09-18: qty 30, 30d supply, fill #2

## 2023-06-30 ENCOUNTER — Other Ambulatory Visit (HOSPITAL_COMMUNITY): Payer: Self-pay

## 2023-07-09 ENCOUNTER — Telehealth: Payer: Self-pay

## 2023-07-09 NOTE — Telephone Encounter (Signed)
Called patient to schedule colonoscopy in [facility] left message on voice mail to call office back to set up procedure.
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
## 2023-07-14 ENCOUNTER — Other Ambulatory Visit: Payer: 59

## 2023-07-16 ENCOUNTER — Other Ambulatory Visit: Payer: 59

## 2023-07-16 ENCOUNTER — Encounter: Payer: 59 | Admitting: Internal Medicine

## 2023-07-16 DIAGNOSIS — E7800 Pure hypercholesterolemia, unspecified: Secondary | ICD-10-CM

## 2023-07-16 DIAGNOSIS — F419 Anxiety disorder, unspecified: Secondary | ICD-10-CM

## 2023-07-16 DIAGNOSIS — E039 Hypothyroidism, unspecified: Secondary | ICD-10-CM

## 2023-07-16 DIAGNOSIS — Z1329 Encounter for screening for other suspected endocrine disorder: Secondary | ICD-10-CM

## 2023-07-16 DIAGNOSIS — Z0000 Encounter for general adult medical examination without abnormal findings: Secondary | ICD-10-CM

## 2023-07-17 ENCOUNTER — Other Ambulatory Visit (HOSPITAL_COMMUNITY): Payer: Self-pay

## 2023-07-17 ENCOUNTER — Encounter: Payer: Self-pay | Admitting: Internal Medicine

## 2023-07-17 ENCOUNTER — Ambulatory Visit (INDEPENDENT_AMBULATORY_CARE_PROVIDER_SITE_OTHER): Payer: 59 | Admitting: Internal Medicine

## 2023-07-17 ENCOUNTER — Other Ambulatory Visit: Payer: 59

## 2023-07-17 VITALS — BP 120/80 | HR 80 | Ht 67.5 in | Wt 171.0 lb

## 2023-07-17 DIAGNOSIS — E039 Hypothyroidism, unspecified: Secondary | ICD-10-CM | POA: Diagnosis not present

## 2023-07-17 DIAGNOSIS — G40909 Epilepsy, unspecified, not intractable, without status epilepticus: Secondary | ICD-10-CM | POA: Diagnosis not present

## 2023-07-17 DIAGNOSIS — F32A Depression, unspecified: Secondary | ICD-10-CM

## 2023-07-17 DIAGNOSIS — F419 Anxiety disorder, unspecified: Secondary | ICD-10-CM | POA: Diagnosis not present

## 2023-07-17 DIAGNOSIS — E7800 Pure hypercholesterolemia, unspecified: Secondary | ICD-10-CM

## 2023-07-17 DIAGNOSIS — Z1211 Encounter for screening for malignant neoplasm of colon: Secondary | ICD-10-CM

## 2023-07-17 DIAGNOSIS — R7989 Other specified abnormal findings of blood chemistry: Secondary | ICD-10-CM

## 2023-07-17 DIAGNOSIS — Z0000 Encounter for general adult medical examination without abnormal findings: Secondary | ICD-10-CM

## 2023-07-17 DIAGNOSIS — F5101 Primary insomnia: Secondary | ICD-10-CM | POA: Diagnosis not present

## 2023-07-17 DIAGNOSIS — F439 Reaction to severe stress, unspecified: Secondary | ICD-10-CM | POA: Diagnosis not present

## 2023-07-17 DIAGNOSIS — C539 Malignant neoplasm of cervix uteri, unspecified: Secondary | ICD-10-CM

## 2023-07-17 MED ORDER — ROSUVASTATIN CALCIUM 10 MG PO TABS
10.0000 mg | ORAL_TABLET | Freq: Every day | ORAL | 3 refills | Status: DC
Start: 2023-07-17 — End: 2024-07-19
  Filled 2023-07-17 – 2023-07-30 (×2): qty 30, 30d supply, fill #0
  Filled 2023-08-26: qty 30, 30d supply, fill #1
  Filled 2023-10-09: qty 30, 30d supply, fill #2
  Filled 2023-11-07: qty 30, 30d supply, fill #3
  Filled 2023-12-25: qty 30, 30d supply, fill #4
  Filled 2024-01-28: qty 30, 30d supply, fill #5
  Filled 2024-02-29: qty 30, 30d supply, fill #6
  Filled 2024-03-26: qty 30, 30d supply, fill #7
  Filled 2024-04-26: qty 30, 30d supply, fill #8
  Filled 2024-06-16: qty 30, 30d supply, fill #9

## 2023-07-17 MED ORDER — ALPRAZOLAM 0.5 MG PO TABS
0.5000 mg | ORAL_TABLET | Freq: Two times a day (BID) | ORAL | 1 refills | Status: DC | PRN
  Filled 2023-07-17: qty 60, 30d supply, fill #0
  Filled 2023-10-12: qty 60, 30d supply, fill #1

## 2023-07-17 MED ORDER — ZOLPIDEM TARTRATE 10 MG PO TABS
10.0000 mg | ORAL_TABLET | Freq: Every evening | ORAL | 1 refills | Status: DC | PRN
Start: 2023-07-17 — End: 2024-06-28
  Filled 2023-07-17: qty 15, 30d supply, fill #0
  Filled 2024-01-10: qty 15, 30d supply, fill #1
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
## 2023-07-17 NOTE — Patient Instructions (Addendum)
Refill Ambien and Xanax as well as Crestor. Please try to exercise a bit and follow low fat diet. Please continue your counseling. Please continue close follow up with Dr. Vickey Huger. Vaccines declines at this time. Transportation remains an issue for her. Suggested [facility] or perhaps they are other community services she can check into for transportation.

## 2023-07-18 LAB — LIPID PANEL
Cholesterol: 206 mg/dL — ABNORMAL HIGH (ref <200)
HDL: 116 mg/dL (ref >=50)
LDL Cholesterol (Calc): 77 mg/dL (calc)
Non-HDL Cholesterol (Calc): 90 mg/dL (calc) (ref <130)
Total CHOL/HDL Ratio: 1.8 (calc) (ref <5.0)
Triglycerides: 52 mg/dL (ref <150)

## 2023-07-18 LAB — CBC WITH DIFFERENTIAL/PLATELET
Absolute Monocytes: 583 cells/uL (ref 200–950)
Basophils Absolute: 32 cells/uL (ref 0–200)
Basophils Relative: 0.6 %
Eosinophils Absolute: 59 cells/uL (ref 15–500)
Eosinophils Relative: 1.1 %
HCT: 42.2 % (ref 35.0–45.0)
Hemoglobin: 13.8 g/dL (ref 11.7–15.5)
Lymphs Abs: 1172 cells/uL (ref 850–3900)
MCH: 32.8 pg (ref 27.0–33.0)
MCHC: 32.7 g/dL (ref 32.0–36.0)
MCV: 100.2 fL — ABNORMAL HIGH (ref 80.0–100.0)
MPV: 9.9 fL (ref 7.5–12.5)
Monocytes Relative: 10.8 %
Neutro Abs: 3553 cells/uL (ref 1500–7800)
Neutrophils Relative %: 65.8 %
Platelets: 191 10*3/uL (ref 140–400)
RBC: 4.21 10*6/uL (ref 3.80–5.10)
RDW: 11.6 % (ref 11.0–15.0)
Total Lymphocyte: 21.7 %
WBC: 5.4 10*3/uL (ref 3.8–10.8)

## 2023-07-18 LAB — COMPLETE METABOLIC PANEL WITH GFR
AG Ratio: 2.2 (calc) (ref 1.0–2.5)
ALT: 19 U/L (ref 6–29)
AST: 20 U/L (ref 10–35)
Albumin: 4.6 g/dL (ref 3.6–5.1)
Alkaline phosphatase (APISO): 76 U/L (ref 37–153)
BUN: 11 mg/dL (ref 7–25)
CO2: 26 mmol/L (ref 20–32)
Calcium: 9.5 mg/dL (ref 8.6–10.4)
Chloride: 101 mmol/L (ref 98–110)
Creat: 0.76 mg/dL (ref 0.50–1.03)
Globulin: 2.1 g/dL (calc) (ref 1.9–3.7)
Glucose, Bld: 90 mg/dL (ref 65–99)
Potassium: 4.2 mmol/L (ref 3.5–5.3)
Sodium: 137 mmol/L (ref 135–146)
Total Bilirubin: 0.5 mg/dL (ref 0.2–1.2)
Total Protein: 6.7 g/dL (ref 6.1–8.1)
eGFR: 90 mL/min/{1.73_m2} (ref >=60)

## 2023-07-18 LAB — TSH: TSH: 1.44 mIU/L (ref 0.40–4.50)

## 2023-07-20 ENCOUNTER — Other Ambulatory Visit: Payer: Self-pay

## 2023-07-30 ENCOUNTER — Other Ambulatory Visit: Payer: Self-pay

## 2023-07-30 ENCOUNTER — Other Ambulatory Visit (HOSPITAL_COMMUNITY): Payer: Self-pay

## 2023-07-30 MED ORDER — FLUOXETINE HCL 20 MG PO CAPS
60.0000 mg | ORAL_CAPSULE | Freq: Every morning | ORAL | 3 refills | Status: DC
  Filled 2023-07-30: qty 90, 30d supply, fill #0
  Filled 2023-08-26: qty 90, 30d supply, fill #1
  Filled 2023-10-09: qty 90, 30d supply, fill #2
  Filled 2023-11-07: qty 90, 30d supply, fill #3

## 2023-07-31 ENCOUNTER — Other Ambulatory Visit (HOSPITAL_COMMUNITY): Payer: Self-pay

## 2023-07-31 ENCOUNTER — Other Ambulatory Visit: Payer: Self-pay | Admitting: Internal Medicine

## 2023-07-31 DIAGNOSIS — Z1212 Encounter for screening for malignant neoplasm of rectum: Secondary | ICD-10-CM

## 2023-07-31 DIAGNOSIS — Z1211 Encounter for screening for malignant neoplasm of colon: Secondary | ICD-10-CM

## 2023-08-13 ENCOUNTER — Other Ambulatory Visit (HOSPITAL_COMMUNITY): Payer: Self-pay

## 2023-08-13 ENCOUNTER — Telehealth: Payer: Self-pay

## 2023-08-13 NOTE — Telephone Encounter (Signed)
*  [facility]  Pharmacy Patient Advocate Encounter  Received notification from [facility] that Prior Authorization for Nayzilam 5MG /0.1ML solution  has been APPROVED from 08/13/2023 to 08/12/2024. Ran test claim, Copay is $383.06. This test claim was processed through [facility]- copay amounts may vary at other pharmacies due to pharmacy/plan contracts, or as the patient moves through the different stages of their insurance plan.   PA #/Case ID/Reference #: [id number]

## 2023-08-14 ENCOUNTER — Other Ambulatory Visit (HOSPITAL_COMMUNITY): Payer: Self-pay

## 2023-08-14 ENCOUNTER — Other Ambulatory Visit: Payer: Self-pay

## 2023-08-18 ENCOUNTER — Telehealth: Payer: Self-pay | Admitting: Internal Medicine

## 2023-08-18 NOTE — Telephone Encounter (Signed)
Patient said she doesn't know if she can come in at 4pm, "she wanted send someone for a whooping cough test here" I told her we don't have those here she asked then how would you test her for whooping cough if she comes in?

## 2023-08-18 NOTE — Telephone Encounter (Signed)
Patient called and said she thinks she might have whooping cough and wasn't sure what the next step should be. No fever, has had cough for a week and will sometimes make her throw up, It has made her really tired as well. She wasn't sure if she needs an appt and if she does she didn't know if it should be a virtual visit or not.

## 2023-08-26 ENCOUNTER — Other Ambulatory Visit: Payer: Self-pay | Admitting: Neurology

## 2023-08-26 ENCOUNTER — Other Ambulatory Visit: Payer: Self-pay

## 2023-08-27 ENCOUNTER — Other Ambulatory Visit: Payer: Self-pay

## 2023-08-27 ENCOUNTER — Other Ambulatory Visit (HOSPITAL_COMMUNITY): Payer: Self-pay

## 2023-08-27 MED ORDER — LAMOTRIGINE 100 MG PO TABS
100.0000 mg | ORAL_TABLET | Freq: Two times a day (BID) | ORAL | 0 refills | Status: DC
  Filled 2023-08-27: qty 60, 30d supply, fill #0

## 2023-08-28 ENCOUNTER — Other Ambulatory Visit (HOSPITAL_COMMUNITY): Payer: Self-pay

## 2023-08-28 ENCOUNTER — Other Ambulatory Visit: Payer: Self-pay

## 2023-09-03 ENCOUNTER — Encounter: Payer: Self-pay | Admitting: Gynecologic Oncology

## 2023-09-04 ENCOUNTER — Other Ambulatory Visit (HOSPITAL_COMMUNITY)
Admission: RE | Admit: 2023-09-04 | Discharge: 2023-09-04 | Disposition: A | Discharge status: Home / Self Care | Payer: 59 | Source: Ambulatory Visit | Attending: Gynecologic Oncology | Admitting: Gynecologic Oncology

## 2023-09-04 ENCOUNTER — Inpatient Hospital Stay: Payer: 59 | Attending: Gynecologic Oncology | Admitting: Gynecologic Oncology

## 2023-09-04 ENCOUNTER — Encounter: Payer: Self-pay | Admitting: Gynecologic Oncology

## 2023-09-04 VITALS — BP 130/81 | HR 82 | Temp 98.7°F | Resp 20 | Wt 175.4 lb

## 2023-09-04 DIAGNOSIS — Z1151 Encounter for screening for human papillomavirus (HPV): Secondary | ICD-10-CM | POA: Diagnosis not present

## 2023-09-04 DIAGNOSIS — Z9221 Personal history of antineoplastic chemotherapy: Secondary | ICD-10-CM | POA: Insufficient documentation

## 2023-09-04 DIAGNOSIS — C539 Malignant neoplasm of cervix uteri, unspecified: Secondary | ICD-10-CM | POA: Insufficient documentation

## 2023-09-04 DIAGNOSIS — Z8541 Personal history of malignant neoplasm of cervix uteri: Secondary | ICD-10-CM | POA: Diagnosis not present

## 2023-09-04 DIAGNOSIS — Z90710 Acquired absence of both cervix and uterus: Secondary | ICD-10-CM | POA: Diagnosis not present

## 2023-09-04 DIAGNOSIS — Z90722 Acquired absence of ovaries, bilateral: Secondary | ICD-10-CM | POA: Insufficient documentation

## 2023-09-04 NOTE — Patient Instructions (Signed)
It was good to see you today.  I do not see or feel any evidence of cancer recurrence on your exam.  I will see you for follow-up in August. I will let you know when pap results are back.  As always, if you develop any new and concerning symptoms before your next visit, please call to see me sooner.

## 2023-09-04 NOTE — Progress Notes (Signed)
Gynecologic Oncology Return Clinic Visit  09/04/23  Reason for Visit:  Surveillance visit in the setting of cervical cancer   Treatment History: Oncology History Overview Note  PD-L1 CPS 0%   Malignant neoplasm of cervix (HCC)  05/20/2021 Initial Diagnosis   The patient reported a history of light vaginal bleeding since approximately May 2022.  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  Due to the abnormal bleeding that she was receiving she was then recommended to follow-up with her gynecologist, Dr. Oscar La, who first evaluated the endometrium with an endometrial biopsy performed on 05/20/2021 which revealed benign inactive atrophic endometrium negative for hyperplasia or malignancy.  The bleeding continued and the patient returned for a colposcopic evaluation of the cervix with biopsies (due to the presence of high risk HPV).  This took place on 06/17/2021.  At the time of that colposcopic evaluation there was some abnormal bleeding noted and friability to the cervix.  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   05/20/2021 Pathology Results   FINAL MICROSCOPIC DIAGNOSIS:   A. ENDOMETRIUM, BIOPSY:  - Benign inactive atrophic endometrium  - Negative for hyperplasia or malignancy   05/30/2021 Imaging   US pelvis Small anteverted uterus Thin, symmetrical endometrium Normal ovaries bilaterally No free fluid.   06/17/2021 Pathology Results   FINAL MICROSCOPIC DIAGNOSIS:   A. CERVIX, 6 O'CLOCK, BIOPSY:  -  Invasive squamous cell carcinoma  -  See comment   B. ENDOCERVIX, CURETTAGE:  -  Squamous cell carcinoma  -  See comment   COMMENT:   A.  The carcinoma is positive for p16 and p40 supporting the diagnosis  of invasive squamous cell carcinoma.     07/11/2021 PET scan   Intense hypermetabolic activity associated with ill-defined nodular enlargement of the cervix/lower uterine segment, consistent with patient's biopsy proven cervical neoplasm.   No definite scintigraphic evidence of metastatic disease in the neck, chest, abdomen or pelvis.   Right middle lobe 4-5 mm perifissural pulmonary nodule unchanged in size from October 25, 2020 and below the resolution of PET-CT but favored to reflect a peri- fissural lymph node, attention on follow-up studies suggested   Nodular focus of hypermetabolic activity along the right anterior sublingual region, which is nonspecific correlation with direct visualization is suggested.   Hypermetabolic hyperplasia of the tonsils with a hypermetabolic 5 mm left level 1b cervical lymph node, favored reactive.   Hypermetabolic activity associated with callus formation in the right anterior sixth rib likely representing a healing nonpathologic rib fracture.   07/19/2021 Initial Diagnosis   Malignant neoplasm of cervix (HCC)   07/23/2021 Cancer Staging   Staging form: Cervix Uteri, AJCC Version 9 - Clinical stage from 07/23/2021: FIGO Stage IB3 (cT1b3, cN0, cM0) - Signed by Artis Delay, MD on 07/23/2021 Stage prefix: Initial diagnosis   07/25/2021 Imaging   1. No acute intracranial abnormality. 2. Minimal multifocal hyperintense T2-weight signal within the white matter, nonspecific. This may be seen in the setting of chronic small vessel disease or migraine headaches.     07/25/2021 Imaging   CT head No acute intracranial abnormality.   07/30/2021 Procedure   Successful placement of a power injectable Port-A-Cath via the right internal jugular vein. The catheter is ready for  immediate use.     08/02/2021 - 08/30/2021 Chemotherapy   Patient is on Treatment Plan : Cervical Cisplatin q7d     10/24/2021 Surgery   TRH/BSO  Findings: On EUA, small mobile uterus. What  appears to be residual tumor (approximately 1cm) replaces the cervical os. On intra-abdominal entry, normal upper abdominal survey. Normal omentum, small and large bowel. Uterus 6cm, normal appearing adnexa. Mild retroperitoneal edema and fibrosis given recent RT. Moderate adhesions between bladder and cervix. Good visual margin taken around the cervix while trying to preserve as much vaginal length as possible. No intra-abdominal or pelvic evidence of disease.   10/24/2021 Pathology Results   A. UTERUS, CERVIX, BILATERAL FALLOPIAN TUBES AND OVARIES:  - Invasive adenosquamous carcinoma of the cervix, see comment  - Carcinoma invades for depth of 0.6 cm  - Resection margins are negative for carcinoma  - Endometriosis of the cervix  - Uterus with benign inactive endometrium  - Benign unremarkable bilateral fallopian tubes and ovaries  - See oncology table      COMMENT:   Most of the tumor shows a squamous morphology with patchy areas of  glandular differentiation. Immunostains show that the tumor cells are  positive for p16, CK5/6 and show evidence of mucin production with  mucicarmine special stain.  Immunostain for p63 is negative.  This  immunoprofile is consistent with above interpretation.     ONCOLOGY TABLE:   UTERINE CERVIX, CARCINOMA: Resection   Procedure: Total hysterectomy and bilateral salpingo-oophorectomy  Tumor Size: 0.8 cm (as measured on HE glass slide)  Histologic Type: Adenosquamous carcinoma  Histologic Grade: G3: Poorly differentiated  Stromal Invasion: Present       Depth of stromal invasion (mm): 6 mm  Other Tissue/ Organ: Not applicable  Margins: All margins negative for invasive carcinoma  Margin Status for HSIL or AIS: All margins negative for HSIL or AIS  Lymphovascular invasion: Not identified       Regional Lymph Nodes: Not applicable (no lymph nodes submitted or  found)  Distant Metastasis:       Distant sites involved: Not applicable  Pathologic  Stage Classification (pTNM, AJCC 8th Edition): pT1b1, pN not  assigned  Ancillary Studies: Can be performed upon request  Representative Tumor Block: A6    01/06/2022 Procedure   Removal of implanted Port-A-Cath utilizing sharp and blunt dissection. The procedure was uncomplicated.     02/13/2022 Imaging   CT A/P: 1. No evidence of metastatic disease in the abdomen or pelvis. 2. Small hiatal hernia. 3. Congenital small bowel malrotation.  No acute abnormality.     Interval History: Doing well.  Denies any vaginal bleeding or discharge.  Reports baseline bowel bladder function.  Denies any abdominal or pelvic pain.  Past Medical/Surgical History: Past Medical History:  Diagnosis Date   Anxiety    Arthritis    Arthritis of ankle    Right   Cervical cancer (HCC) 2020   Depression    Episode of loss of consciousness    History of radiation therapy    Pelvis 07/31/2021-09/03/2021  HDR tandem and ring 09/26/2021  Dr Antony Blackbird   Hypothyroidism    Insomnia    Pure hypercholesterolemia    Seizure [facility])     Past Surgical History:  Procedure Laterality Date   ABDOMINAL HYSTERECTOMY     ANKLE SURGERY Right    [facility]   IR IMAGING GUIDED PORT INSERTION  07/30/2021   IR REMOVAL TUN ACCESS W/ PORT W/O FL MOD SED  01/03/2022   OPERATIVE ULTRASOUND N/A 09/26/2021   Procedure: OPERATIVE ULTRASOUND;  Surgeon: Antony Blackbird, MD;  Location: [facility];  Service: Urology;  Laterality: N/A;   ROBOTIC ASSISTED TOTAL HYSTERECTOMY WITH BILATERAL SALPINGO OOPHERECTOMY Bilateral 10/24/2021   Procedure: XI ROBOTIC ASSISTED TOTAL HYSTERECTOMY WITH BILATERAL SALPINGO OOPHORECTOMY;  Surgeon: Carver Fila, MD;  Location: [facility];  Service: Gynecology;  Laterality: Bilateral;   TANDEM RING INSERTION N/A 09/26/2021   Procedure: TANDEM RING INSERTION;  Surgeon: Antony Blackbird, MD;  Location: [facility];  Service: Urology;  Laterality: N/A;   WISDOM TOOTH EXTRACTION      Family History  Problem Relation  Age of Onset   Colon polyps Mother    Heart attack Father    Diabetes Brother    Pancreatic cancer Maternal Grandmother    Sudden death Neg Hx    Hypertension Neg Hx    Breast cancer Neg Hx    Colon cancer Neg Hx     Social History   Socioeconomic History   Marital status: Married    Spouse name: Not on file   Number of children: 2   Years of education: Not on file   Highest education level: Not on file  Occupational History   Not on file  Tobacco Use   Smoking status: Former    Current packs/day: 0.00    Average packs/day: 0.3 packs/day for 25.0 years (6.3 ttl pk-yrs)    Types: Cigarettes    Start date: 62    Quit date: 2016    Years since quitting: 8.8   Smokeless tobacco: Never   Tobacco comments:    20+ yrs  Vaping Use   Vaping status: Never Used  Substance and Sexual Activity   Alcohol use: Yes    Comment: 2 glasses of wine a day   Drug use: Yes    Frequency: 7.0 times per week    Types: Marijuana    Comment: Last use 07-10-21   Sexual activity: Not Currently    Birth control/protection: Post-menopausal  Other Topics Concern   Not on file  Social History Narrative   Lives alone   R handed   Caffeine: 1 C of coffee a day   Social Determinants of Corporate investment banker Strain: Not on file  Food Insecurity: Not on file  Transportation Needs: Not on file  Physical Activity: Not on file  Stress: Not on file  Social Connections: Not on file    Current Medications:  Current Outpatient Medications:    ALPRAZolam (XANAX) 0.5 MG tablet, Take 1 tablet (0.5 mg total) by mouth 2 (two) times daily as needed for anxiety., Disp: 60 tablet, Rfl: 1   azelastine (ASTELIN) 0.1 % nasal spray, Place 2 sprays into both nostrils 2 (two) times daily. Use in each nostril as directed, Disp: 30 mL, Rfl: 0   benzonatate (TESSALON) 100 MG capsule, Take 1 capsule (100 mg total) by mouth 2 (two) times daily as needed for cough., Disp: 20 capsule, Rfl: 0   Brimonidine  Tartrate (LUMIFY) 0.025 % SOLN, Place 1 drop into both eyes daily as needed (red eyes (avg 4x/week))., Disp: , Rfl:    FLUoxetine (PROZAC) 20 MG capsule, Take 3 capsules (60 mg total) by mouth in the morning., Disp: 90 capsule, Rfl: 2   ibuprofen (ADVIL) 200 MG tablet, Take 400 mg by mouth every 8 (eight) hours as needed (pain.)., Disp: , Rfl:    lamoTRIgine (LAMICTAL) 100 MG tablet, Take 1 tablet (100 mg  total) by mouth 2 (two) times daily. (need appt for more refills), Disp: 60 tablet, Rfl: 0   levETIRAcetam (KEPPRA) 500 MG tablet, Take 1 tablet (500 mg total) by mouth 2 (two) times daily., Disp: 180 tablet, Rfl: 1   levothyroxine (SYNTHROID) 50 MCG tablet, Take 1 tablet (50 mcg total) by mouth daily before breakfast., Disp: 90 tablet, Rfl: 0   Midazolam (NAYZILAM) 5 MG/0.1ML SOLN, Place 5 mg into the nose as needed., Disp: 2 each, Rfl: 2   prenatal vitamin w/FE, FA (PRENATAL 1 + 1) 27-1 MG TABS tablet, Take 1 tablet by mouth 2 (two) times a week., Disp: , Rfl:    rosuvastatin (CRESTOR) 10 MG tablet, Take 1 tablet (10 mg total) by mouth daily., Disp: 90 tablet, Rfl: 3   zolpidem (AMBIEN) 10 MG tablet, Take 1 tablet (10 mg total) by mouth at bedtime as needed for sleep., Disp: 90 tablet, Rfl: 1  Review of Systems: + anxiety Denies appetite changes, fevers, chills, fatigue, unexplained weight changes. Denies hearing loss, neck lumps or masses, mouth sores, ringing in ears or voice changes. Denies cough or wheezing.  Denies shortness of breath. Denies chest pain or palpitations. Denies leg swelling. Denies abdominal distention, pain, blood in stools, constipation, diarrhea, nausea, vomiting, or early satiety. Denies pain with intercourse, dysuria, frequency, hematuria or incontinence. Denies hot flashes, pelvic pain, vaginal bleeding or vaginal discharge.   Denies joint pain, back pain or muscle pain/cramps. Denies itching, rash, or wounds. Denies dizziness, headaches, numbness or  seizures. Denies swollen lymph nodes or glands, denies easy bruising or bleeding. Denies depression, confusion, or decreased concentration.  Physical Exam: BP 130/81 (BP Location: Left Arm, Patient Position: Sitting)   Pulse 82   Temp 98.7 F (37.1 C) (Oral)   Resp 20   Wt 175 lb 6.4 oz (79.6 kg)   SpO2 98%   BMI 27.07 kg/m  General: Alert, oriented, no acute distress. HEENT: Normocephalic, atraumatic, sclera anicteric. Chest: Clear to auscultation bilaterally.  No wheezes or rhonchi. Cardiovascular: Regular rate and rhythm, no murmurs. Abdomen: soft, nontender.  Normoactive bowel sounds.  No masses or hepatosplenomegaly appreciated. Well healed incisions. Extremities: Grossly normal range of motion.  Warm, well perfused.  No edema bilaterally. Skin: No rashes or lesions noted. GU: Normal appearing external genitalia without erythema, excoriation, or lesions.  Speculum exam reveals cuff intact, no visible lesions or masses.  No bleeding or discharge.  Moderate atrophy noted, minimal adhesions at apex, unchanged from prior visits.  On bimanual exam, cuff is smooth, no nodularity.  This is confirmed on rectovaginal exam.  Laboratory & Radiologic Studies: 08/2022: Vaginal pap NIML, HR HPV negative  Assessment & Plan: Ashley Hammond is a 57 y.o. woman with a history of Stage IB3 SCC of the cervix status post completion surgery after pelvic radiation and one HDR treatment who presents for surveillance visit. Interval surgery 09/2021. CPS 0%   Patient is doing well from a gynecologic cancer standpoint and is NED on exam today.  Pap and HPV collected today.   Per NCCN surveillance recommendations, the patient will continue with follow-up visits every 3 months alternating between my office and radiation oncology.  She is scheduled with Dr. Roselind Messier in February.  At that time, she will be more than 2 years out from completion of adjuvant treatment and we will transition to visits every 6  months.  I will plan to see her back in August.  We will plan on yearly cotesting.   If  she were develop concerning symptoms or have findings on exam that were suspicious for recurrence, I stressed the importance of calling to let me know.    20 minutes of total time was spent for this patient encounter, including preparation, face-to-face counseling with the patient and coordination of care, and documentation of the encounter.  Eugene Garnet, MD  Division of Gynecologic Oncology  Department of Obstetrics and Gynecology  [facility]

## 2023-09-10 LAB — CYTOLOGY - PAP
Comment: NEGATIVE
Diagnosis: NEGATIVE
High risk HPV: NEGATIVE

## 2023-09-10 NOTE — Progress Notes (Signed)
Please let the patient know pap and HPV are both back and negative. Thank you

## 2023-09-11 ENCOUNTER — Telehealth: Payer: Self-pay | Admitting: *Deleted

## 2023-09-11 NOTE — Telephone Encounter (Signed)
Spoke with Ashley Hammond in regards to her results of recent Pap. Pt states she received her negative results through MyChart and thanked the office for calling. Pt had no concerns or questions at this time.

## 2023-09-11 NOTE — Telephone Encounter (Signed)
-----   Message from Carver Fila sent at 09/10/2023  5:58 PM EST ----- Please let the patient know pap and HPV are both back and negative. Thank you

## 2023-09-11 NOTE — Telephone Encounter (Signed)
Attempted to reach patient to relay results of Pap. Left voicemail requesting call back.

## 2023-09-15 ENCOUNTER — Other Ambulatory Visit (HOSPITAL_COMMUNITY): Payer: Self-pay

## 2023-09-18 ENCOUNTER — Other Ambulatory Visit: Payer: Self-pay

## 2023-09-18 ENCOUNTER — Other Ambulatory Visit (HOSPITAL_COMMUNITY): Payer: Self-pay

## 2023-09-23 DIAGNOSIS — Z1211 Encounter for screening for malignant neoplasm of colon: Secondary | ICD-10-CM | POA: Diagnosis not present

## 2023-10-01 LAB — COLOGUARD: COLOGUARD: NEGATIVE

## 2023-10-09 ENCOUNTER — Other Ambulatory Visit: Payer: Self-pay | Admitting: Neurology

## 2023-10-09 ENCOUNTER — Other Ambulatory Visit: Payer: Self-pay

## 2023-10-09 ENCOUNTER — Other Ambulatory Visit (HOSPITAL_COMMUNITY): Payer: Self-pay

## 2023-10-12 ENCOUNTER — Other Ambulatory Visit (HOSPITAL_COMMUNITY): Payer: Self-pay

## 2023-10-12 ENCOUNTER — Other Ambulatory Visit: Payer: Self-pay | Admitting: Internal Medicine

## 2023-10-12 MED ORDER — LEVOTHYROXINE SODIUM 50 MCG PO TABS
50.0000 ug | ORAL_TABLET | Freq: Every day | ORAL | 0 refills | Status: DC
Start: 2023-10-12 — End: 2024-01-28
  Filled 2023-10-12: qty 30, 30d supply, fill #0
  Filled 2023-11-30: qty 30, 30d supply, fill #1
  Filled 2024-01-05: qty 30, 30d supply, fill #2

## 2023-10-13 ENCOUNTER — Other Ambulatory Visit: Payer: Self-pay

## 2023-10-14 ENCOUNTER — Other Ambulatory Visit (HOSPITAL_COMMUNITY): Payer: Self-pay

## 2023-10-14 ENCOUNTER — Other Ambulatory Visit: Payer: Self-pay

## 2023-10-14 MED ORDER — LAMOTRIGINE 100 MG PO TABS
100.0000 mg | ORAL_TABLET | Freq: Two times a day (BID) | ORAL | 0 refills | Status: DC
  Filled 2023-10-14: qty 60, 30d supply, fill #0

## 2023-11-03 ENCOUNTER — Other Ambulatory Visit: Payer: Self-pay | Admitting: Neurology

## 2023-11-07 ENCOUNTER — Other Ambulatory Visit: Payer: Self-pay | Admitting: Neurology

## 2023-11-09 ENCOUNTER — Other Ambulatory Visit: Payer: Self-pay

## 2023-11-09 NOTE — Telephone Encounter (Signed)
 Last seen on 05/22/22 No 3 month follow up scheduled

## 2023-11-11 ENCOUNTER — Other Ambulatory Visit (HOSPITAL_COMMUNITY): Payer: Self-pay

## 2023-11-11 ENCOUNTER — Other Ambulatory Visit: Payer: Self-pay

## 2023-11-11 DIAGNOSIS — F331 Major depressive disorder, recurrent, moderate: Secondary | ICD-10-CM | POA: Diagnosis not present

## 2023-11-11 MED ORDER — ALPRAZOLAM 0.5 MG PO TABS
0.5000 mg | ORAL_TABLET | Freq: Every day | ORAL | 5 refills | Status: DC | PRN
  Filled 2023-11-11: qty 30, 30d supply, fill #0

## 2023-11-11 MED ORDER — FLUOXETINE HCL 20 MG PO CAPS
60.0000 mg | ORAL_CAPSULE | Freq: Every morning | ORAL | 5 refills | Status: DC
  Filled 2023-11-11 – 2023-12-03 (×4): qty 90, 30d supply, fill #0
  Filled 2024-01-10: qty 90, 30d supply, fill #1
  Filled 2024-02-05: qty 90, 30d supply, fill #2
  Filled 2024-03-10: qty 90, 30d supply, fill #3
  Filled 2024-04-05: qty 90, 30d supply, fill #4
  Filled 2024-05-05: qty 90, 30d supply, fill #5

## 2023-11-11 MED ORDER — ZOLPIDEM TARTRATE 10 MG PO TABS
10.0000 mg | ORAL_TABLET | Freq: Every evening | ORAL | 3 refills | Status: DC | PRN
  Filled 2023-11-11: qty 30, 30d supply, fill #0
  Filled 2024-01-28 – 2024-02-22 (×2): qty 15, 30d supply, fill #0

## 2023-11-12 ENCOUNTER — Telehealth: Payer: Self-pay | Admitting: Neurology

## 2023-11-12 ENCOUNTER — Other Ambulatory Visit: Payer: Self-pay

## 2023-11-12 ENCOUNTER — Other Ambulatory Visit (HOSPITAL_COMMUNITY): Payer: Self-pay

## 2023-11-12 MED ORDER — LAMOTRIGINE 100 MG PO TABS
100.0000 mg | ORAL_TABLET | Freq: Two times a day (BID) | ORAL | 0 refills | Status: DC
  Filled 2023-11-12: qty 60, 30d supply, fill #0

## 2023-11-12 NOTE — Telephone Encounter (Signed)
Received a notification from the pt's NP at [facility].  She is requesting input from Dr Vickey Huger. Will send the notification to Dr Dohmeier for her to provide her input.

## 2023-11-13 ENCOUNTER — Encounter: Payer: Self-pay | Admitting: Neurology

## 2023-11-13 NOTE — Telephone Encounter (Signed)
Dr Dohmeier would recommend increasing the lamotrigine to 150 mg BID. (Total of 300 mg daily)

## 2023-11-17 ENCOUNTER — Other Ambulatory Visit (HOSPITAL_COMMUNITY): Payer: Self-pay

## 2023-11-30 ENCOUNTER — Other Ambulatory Visit: Payer: Self-pay

## 2023-11-30 ENCOUNTER — Other Ambulatory Visit (HOSPITAL_COMMUNITY): Payer: Self-pay

## 2023-12-02 ENCOUNTER — Other Ambulatory Visit (HOSPITAL_COMMUNITY): Payer: Self-pay

## 2023-12-02 ENCOUNTER — Telehealth: Payer: Self-pay | Admitting: Neurology

## 2023-12-02 ENCOUNTER — Ambulatory Visit: Payer: 59 | Admitting: Neurology

## 2023-12-02 ENCOUNTER — Encounter: Payer: Self-pay | Admitting: Neurology

## 2023-12-02 VITALS — BP 119/75 | HR 81 | Ht 68.0 in | Wt 175.0 lb

## 2023-12-02 DIAGNOSIS — R55 Syncope and collapse: Secondary | ICD-10-CM

## 2023-12-02 DIAGNOSIS — C539 Malignant neoplasm of cervix uteri, unspecified: Secondary | ICD-10-CM

## 2023-12-02 DIAGNOSIS — G40009 Localization-related (focal) (partial) idiopathic epilepsy and epileptic syndromes with seizures of localized onset, not intractable, without status epilepticus: Secondary | ICD-10-CM | POA: Diagnosis not present

## 2023-12-02 DIAGNOSIS — F419 Anxiety disorder, unspecified: Secondary | ICD-10-CM

## 2023-12-02 DIAGNOSIS — R4189 Other symptoms and signs involving cognitive functions and awareness: Secondary | ICD-10-CM

## 2023-12-02 DIAGNOSIS — F32A Depression, unspecified: Secondary | ICD-10-CM

## 2023-12-02 MED ORDER — LAMOTRIGINE 100 MG PO TABS
100.0000 mg | ORAL_TABLET | Freq: Two times a day (BID) | ORAL | 0 refills | Status: DC
  Filled 2023-12-02 – 2023-12-08 (×2): qty 60, 30d supply, fill #0

## 2023-12-02 MED ORDER — NAYZILAM 5 MG/0.1ML NA SOLN
5.0000 mg | NASAL | 2 refills | Status: DC | PRN
  Filled 2023-12-02 – 2023-12-22 (×3): qty 2, 5d supply, fill #0
  Filled 2024-02-05: qty 2, 5d supply, fill #1
  Filled 2024-04-19: qty 2, 5d supply, fill #2

## 2023-12-02 MED ORDER — LEVETIRACETAM 500 MG PO TABS
500.0000 mg | ORAL_TABLET | Freq: Two times a day (BID) | ORAL | 1 refills | Status: DC
  Filled 2023-12-02: qty 60, 30d supply, fill #0
  Filled 2023-12-25: qty 60, 30d supply, fill #1
  Filled 2024-01-10 – 2024-01-26 (×2): qty 60, 30d supply, fill #2
  Filled 2024-02-29: qty 60, 30d supply, fill #3
  Filled 2024-03-26: qty 60, 30d supply, fill #4
  Filled 2024-04-26: qty 60, 30d supply, fill #5

## 2023-12-02 MED ORDER — ALPRAZOLAM 0.5 MG PO TABS
0.5000 mg | ORAL_TABLET | Freq: Every day | ORAL | 1 refills | Status: DC | PRN
  Filled 2023-12-02: qty 30, 30d supply, fill #0
  Filled 2024-01-10: qty 30, 30d supply, fill #1
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
## 2023-12-02 NOTE — Telephone Encounter (Signed)
 Referral for neuropsychology sent through [facility] to [facility]. Phone: [phone], Fax: [phone]

## 2023-12-03 ENCOUNTER — Encounter: Payer: Self-pay | Admitting: Neurology

## 2023-12-03 ENCOUNTER — Telehealth: Payer: Self-pay | Admitting: Internal Medicine

## 2023-12-03 ENCOUNTER — Other Ambulatory Visit (HOSPITAL_COMMUNITY): Payer: Self-pay

## 2023-12-03 NOTE — Telephone Encounter (Signed)
 Copied from CRM [id number]. Topic: General - Other >> Dec 03, 2023  1:59 PM Felizardo Hotter wrote: Reason for CRM: Pt called needs to speak with someone about disability, pt declined appt. Please call pt. [phone]

## 2023-12-03 NOTE — Telephone Encounter (Signed)
 Called patient back to see what she was needing. She was wanting to see what she needs to do about starting the process about getting disability. I let her know she would need to file it with Social Security and then they would request medical records from our office and any doctors that she had seen. She had been told by another doctor that we started the process. She has gone online and started the paperwork with social security. I let her know that is the first step and they would determine the next steps.

## 2023-12-07 ENCOUNTER — Ambulatory Visit
Admission: RE | Admit: 2023-12-07 | Discharge: 2023-12-07 | Disposition: A | Discharge status: Home / Self Care | Payer: 59 | Source: Ambulatory Visit | Attending: Radiation Oncology | Admitting: Radiation Oncology

## 2023-12-08 ENCOUNTER — Other Ambulatory Visit (HOSPITAL_COMMUNITY): Payer: Self-pay

## 2023-12-08 ENCOUNTER — Other Ambulatory Visit: Payer: Self-pay

## 2023-12-11 ENCOUNTER — Other Ambulatory Visit (HOSPITAL_COMMUNITY): Payer: Self-pay

## 2023-12-14 DIAGNOSIS — M2032 Hallux varus (acquired), left foot: Secondary | ICD-10-CM | POA: Diagnosis not present

## 2023-12-14 DIAGNOSIS — M13871 Other specified arthritis, right ankle and foot: Secondary | ICD-10-CM | POA: Diagnosis not present

## 2023-12-17 ENCOUNTER — Other Ambulatory Visit (HOSPITAL_COMMUNITY): Payer: Self-pay

## 2023-12-20 NOTE — Progress Notes (Signed)
 Radiation Oncology         [phone] ________________________________  Name: Ashley Hammond MRN: [id number]  Date: 12/21/2023  DOB: [birth year]  Follow-Up Visit Note  CC: Margaree Mackintosh, MD  Adolphus Birchwood, MD  No diagnosis found.  Diagnosis: The encounter diagnosis was Malignant neoplasm of cervix, unspecified site [facility]).   Squamous cell carcinoma of the cervix, Clinical Stage IB3  Interval Since Last Radiation: 2 years, 2 months, and 23 days   Intent: Curative  Radiation Treatment Dates: 07/31/2021 through 09/26/2021 Site Technique Total Dose (Gy) Dose per Fx (Gy) Completed Fx Beam Energies  Pelvis: Pelvis IMRT 45/45 1.8 25/25 15X  Cervix: Cervix_Bst_Fx1 HDR-brachy 5.5/5.5 5.5 1/1 Ir-192    Narrative:  The patient returns today for routine follow-up. She was last seen here or follow-up on 06/01/23.      During her most recent visit with Dr. Pricilla Holm on 09/04/23, she denied any symptoms concerning for disease recurrence and she was noted as NED on examination. Routine Pap and HPV testing were collected at that time and both came back negative.       No other significant oncologic interval history since the patient was last seen here for follow-up.   ***            Allergies:  has no known allergies.  Meds: Current Outpatient Medications  Medication Sig Dispense Refill   ALPRAZolam (XANAX) 0.5 MG tablet Take 1 tablet (0.5 mg total) by mouth daily as needed for anxiety 30 tablet 1   azelastine (ASTELIN) 0.1 % nasal spray Place 2 sprays into both nostrils 2 (two) times daily. Use in each nostril as directed 30 mL 0   benzonatate (TESSALON) 100 MG capsule Take 1 capsule (100 mg total) by mouth 2 (two) times daily as needed for cough. 20 capsule 0   Brimonidine Tartrate (LUMIFY) 0.025 % SOLN Place 1 drop into both eyes daily as needed (red eyes (avg 4x/week)).     FLUoxetine (PROZAC) 20 MG capsule Take 3 capsules (60 mg total) by mouth in the morning. 90 capsule 2    FLUoxetine (PROZAC) 20 MG capsule Take 3 capsules (60 mg total) by mouth in the morning. 90 capsule 5   ibuprofen (ADVIL) 200 MG tablet Take 400 mg by mouth every 8 (eight) hours as needed (pain.).     lamoTRIgine (LAMICTAL) 100 MG tablet Take 1 tablet (100 mg total) by mouth 2 (two) times daily. (need appt for more refills) 60 tablet 0   levETIRAcetam (KEPPRA) 500 MG tablet Take 1 tablet (500 mg total) by mouth 2 (two) times daily. 180 tablet 1   levothyroxine (SYNTHROID) 50 MCG tablet Take 1 tablet (50 mcg total) by mouth daily before breakfast. 90 tablet 0   Midazolam (NAYZILAM) 5 MG/0.1ML SOLN Place 5 mg into the nose as needed. 2 each 2   prenatal vitamin w/FE, FA (PRENATAL 1 + 1) 27-1 MG TABS tablet Take 1 tablet by mouth 2 (two) times a week.     rosuvastatin (CRESTOR) 10 MG tablet Take 1 tablet (10 mg total) by mouth daily. 90 tablet 3   zolpidem (AMBIEN) 10 MG tablet Take 1 tablet (10 mg total) by mouth at bedtime as needed for sleep. 90 tablet 1   zolpidem (AMBIEN) 10 MG tablet Take 1 tablet (10 mg total) by mouth at bedtime as needed. 30 tablet 3   No current facility-administered medications for this encounter.    Physical Findings: The patient is in no acute  distress. Patient is alert and oriented.  vitals were not taken for this visit. .  No significant changes. Lungs are clear to auscultation bilaterally. Heart has regular rate and rhythm. No palpable cervical, supraclavicular, or axillary adenopathy. Abdomen soft, non-tender, normal bowel sounds.  On pelvic examination the external genitalia were unremarkable. A speculum exam was performed. There are no mucosal lesions noted in the vaginal vault. A Pap smear was obtained of the proximal vagina. On bimanual and rectovaginal examination there were no pelvic masses appreciated. ***    Lab Findings: Lab Results  Component Value Date   WBC 5.4 07/17/2023   HGB 13.8 07/17/2023   HCT 42.2 07/17/2023   MCV 100.2 (H) 07/17/2023    PLT 191 07/17/2023    Radiographic Findings: No results found.  Impression: Squamous cell carcinoma of the cervix, Clinical Stage IB3  The patient is recovering from the effects of radiation.  ***  Plan:  ***   *** minutes of total time was spent for this patient encounter, including preparation, face-to-face counseling with the patient and coordination of care, physical exam, and documentation of the encounter. ____________________________________  Billie Lade, PhD, MD  This document serves as a record of services personally performed by Antony Blackbird, MD. It was created on his behalf by Neena Rhymes, a trained medical scribe. The creation of this record is based on the scribe's personal observations and the provider's statements to them. This document has been checked and approved by the attending provider.

## 2023-12-21 ENCOUNTER — Encounter: Payer: Self-pay | Admitting: Radiation Oncology

## 2023-12-21 ENCOUNTER — Other Ambulatory Visit (HOSPITAL_COMMUNITY): Payer: Self-pay

## 2023-12-21 ENCOUNTER — Ambulatory Visit
Admission: RE | Admit: 2023-12-21 | Discharge: 2023-12-21 | Disposition: A | Discharge status: Home / Self Care | Payer: 59 | Source: Ambulatory Visit | Attending: Radiation Oncology | Admitting: Radiation Oncology

## 2023-12-21 ENCOUNTER — Other Ambulatory Visit: Payer: Self-pay

## 2023-12-21 VITALS — BP 124/86 | HR 87 | Temp 97.7°F | Resp 18 | Ht 68.0 in | Wt 177.2 lb

## 2023-12-21 DIAGNOSIS — N952 Postmenopausal atrophic vaginitis: Secondary | ICD-10-CM | POA: Diagnosis not present

## 2023-12-21 DIAGNOSIS — Z923 Personal history of irradiation: Secondary | ICD-10-CM | POA: Insufficient documentation

## 2023-12-21 DIAGNOSIS — Z79899 Other long term (current) drug therapy: Secondary | ICD-10-CM | POA: Insufficient documentation

## 2023-12-21 DIAGNOSIS — Z8541 Personal history of malignant neoplasm of cervix uteri: Secondary | ICD-10-CM | POA: Insufficient documentation

## 2023-12-21 DIAGNOSIS — Z79890 Hormone replacement therapy (postmenopausal): Secondary | ICD-10-CM | POA: Insufficient documentation

## 2023-12-21 DIAGNOSIS — C539 Malignant neoplasm of cervix uteri, unspecified: Secondary | ICD-10-CM | POA: Diagnosis not present

## 2023-12-21 DIAGNOSIS — C531 Malignant neoplasm of exocervix: Secondary | ICD-10-CM

## 2023-12-21 MED ORDER — METRONIDAZOLE 0.75 % VA GEL
1.0000 | Freq: Two times a day (BID) | VAGINAL | 0 refills | Status: DC
  Filled 2023-12-21: qty 70, 5d supply, fill #0

## 2023-12-21 NOTE — Progress Notes (Signed)
 Ashley Hammond is here today for follow up post radiation to the pelvic.  They completed their radiation on: 09/26/21   Does the patient complain of any of the following:  Pain: No Abdominal bloating: No Diarrhea/Constipation: No Nausea/Vomiting: Vomiting at night.  Vaginal Discharge: Yes, light yellow with odor.  Blood in Urine or Stool: No Urinary Issues (dysuria/incomplete emptying/ incontinence/ increased frequency/urgency):  Reports urinary incontinence with laughter and coughing.  Does patient report using vaginal dilator 2-3 times a week and/or sexually active 2-3 weeks: Yes.  Post radiation skin changes:    Additional comments if applicable:   BP 124/86 (BP Location: Left Arm, Patient Position: Sitting, Cuff Size: Normal)   Pulse 87   Temp 97.7 F (36.5 C)   Resp 18   Ht 5\' 8"  (1.727 m)   Wt 177 lb 3.2 oz (80.4 kg)   SpO2 98%   BMI 26.94 kg/m

## 2023-12-22 ENCOUNTER — Other Ambulatory Visit (HOSPITAL_COMMUNITY): Payer: Self-pay

## 2023-12-22 ENCOUNTER — Other Ambulatory Visit: Payer: Self-pay

## 2023-12-25 ENCOUNTER — Other Ambulatory Visit: Payer: Self-pay

## 2024-01-05 ENCOUNTER — Other Ambulatory Visit (HOSPITAL_COMMUNITY): Payer: Self-pay

## 2024-01-10 ENCOUNTER — Other Ambulatory Visit: Payer: Self-pay

## 2024-01-10 ENCOUNTER — Other Ambulatory Visit: Payer: Self-pay | Admitting: Neurology

## 2024-01-11 ENCOUNTER — Other Ambulatory Visit (HOSPITAL_COMMUNITY): Payer: Self-pay

## 2024-01-11 ENCOUNTER — Other Ambulatory Visit: Payer: Self-pay

## 2024-01-11 MED ORDER — LAMOTRIGINE 100 MG PO TABS
100.0000 mg | ORAL_TABLET | Freq: Two times a day (BID) | ORAL | 10 refills | Status: DC
  Filled 2024-01-11: qty 60, 30d supply, fill #0
  Filled 2024-02-29: qty 60, 30d supply, fill #1
  Filled 2024-03-26: qty 60, 30d supply, fill #2
  Filled 2024-04-26: qty 60, 30d supply, fill #3
  Filled 2024-06-14: qty 60, 30d supply, fill #4

## 2024-01-11 NOTE — Telephone Encounter (Signed)
 Last seen on 12/02/23 Follow up scheduled on 12/01/24

## 2024-01-11 NOTE — Progress Notes (Signed)
 Patient Care Team: Margaree Mackintosh, MD as PCP - General (Internal Medicine) Meriam Sprague, MD (Inactive) as PCP - Cardiology (Cardiology)  Visit Date: 01/14/24  Subjective:   Chief Complaint  Patient presents with   Medical Management of Chronic Issues  Patient NF:[id number] F Antonellis,Female DOB:1966/04/28,57 y.o. HQI:[id number]   58 y.o. Female presents today for 6 months follow-up for Hypothyroidism. Patient has a past medical history of Hypothyroidism, seizure disorder, SCC of Cervix, HLD, and Depression. Last seen 07/17/2023 for her annual visit, in the interim she has seen  Dr. Eugene Garnet w/ Gyn-Onc, Ellis Savage for anxiety/depression, Dr, Porfirio Mylar Dohmeier w/ Neurology for seizure management, and Dr. Toni Arthurs w/ [facility].   History of Hypothyroidism treated with Levothyroxine 50 mcg daily before breakfast. 01/12/2024 TSH 2.46, elevated from 1.38 but still WNL.Continue same dose of thyroid replacement.  History of Pure Hypercholesterolemia treated with Rosuvastatin 10 mg daily. 01/12/2024 Lipid Panel, compared to 06/2023 w/ Cholesterol 221, elevated from 206, otherwise WNL. Coronary Cardiac Score 94 in 2021.    History of Epilepsy treated with Keppra 500 mg BID and Lamictal 100 mg BID per Dr. Vickey Huger w/ Neurology. Unable to drive due to this disorder. Says that she does still have seizures sporadically, with an olfactory aura and a new symptom of tingling prior to seizure onset, and can usually prevent them by taking 1-2 Xanax during those auras. Uses [employer] for transportation.  History of Squamous Cell Carcinoma (SCC) of Cervix followed by GYN Oncology,  was seen 08/2023.    History of Depression followed by Ellis Savage treated with Prozac 60 mg daily; History of Situational Stress discussed.Husband has alcohol issues and resides in another residence but sometimes comes home drunk.  History of  Arthritis, Right Ankle; Lateral Ligament Reconstruction in 10/2018. Says that she  is having difficulty walking again, recently saw Dr. Toni Arthurs in February for this.     Other labs reviewed: CBC and C-MET were WNL.  Vaccine Counseling: Due for Pneumococcal and Shingles vaccines. Discussed but patient declines at present time.  Mammogram ordered.   Colo-guard NEGATIVE 09/23/2023.  Past Medical History:  Diagnosis Date   Anxiety    Arthritis    Arthritis of ankle    Right   Cervical cancer (HCC) 2020   Depression    Episode of loss of consciousness    History of radiation therapy    Pelvis 07/31/2021-09/03/2021  HDR tandem and ring 09/26/2021  Dr Antony Blackbird   Hypothyroidism    Insomnia    Pure hypercholesterolemia    Seizure (HCC)   No Known Allergies  Family History  Problem Relation Age of Onset   Colon polyps Mother    Heart attack Father    Diabetes Brother    Pancreatic cancer Maternal Grandmother    Sudden death Neg Hx    Hypertension Neg Hx    Breast cancer Neg Hx    Colon cancer Neg Hx    Seizures Neg Hx    Social History   Social History Narrative   Lives alone   R handed   Caffeine: 1 C of coffee a day   Retired    Review of Systems  Constitutional:  Negative for fever and malaise/fatigue.  HENT:  Negative for congestion.   Eyes:  Negative for blurred vision.  Respiratory:  Negative for cough and shortness of breath.   Cardiovascular:  Negative for chest pain, palpitations and leg swelling.  Gastrointestinal:  Negative for vomiting.  Musculoskeletal:  Negative for back pain.  Skin:  Negative for rash.  Neurological:  Negative for loss of consciousness and headaches.     Objective:  Vitals: BP 116/74   Pulse 86   Temp 98.3 F (36.8 C)   Wt 177 lb (80.3 kg)   SpO2 97%   BMI 26.91 kg/m   Physical Exam Vitals and nursing note reviewed.  Constitutional:      General: She is not in acute distress.    Appearance: Normal appearance. She is not toxic-appearing.  HENT:     Head: Normocephalic and atraumatic.  Cardiovascular:      Rate and Rhythm: Normal rate and regular rhythm. No extrasystoles are present.    Pulses: Normal pulses.     Heart sounds: Normal heart sounds. No murmur heard.    No friction rub. No gallop.  Pulmonary:     Effort: Pulmonary effort is normal. No respiratory distress.     Breath sounds: Normal breath sounds. No wheezing or rales.  Skin:    General: Skin is warm and dry.  Neurological:     Mental Status: She is alert and oriented to person, place, and time. Mental status is at baseline.  Psychiatric:        Mood and Affect: Mood normal.        Behavior: Behavior normal.        Thought Content: Thought content normal.        Judgment: Judgment normal.     Results:  Studies Obtained And Personally Reviewed By Me:  Colo-guard NEGATIVE 09/23/2023.  Labs:     Component Value Date/Time   NA 141 01/12/2024 1205   K 4.0 01/12/2024 1205   CL 101 01/12/2024 1205   CO2 29 01/12/2024 1205   GLUCOSE 99 01/12/2024 1205   BUN 10 01/12/2024 1205   CREATININE 0.85 01/12/2024 1205   CALCIUM 9.5 01/12/2024 1205   PROT 6.6 01/12/2024 1205   ALBUMIN 4.5 07/25/2021 1725   AST 23 01/12/2024 1205   ALT 15 01/12/2024 1205   ALKPHOS 70 07/25/2021 1725   BILITOT 0.6 01/12/2024 1205   GFRNONAA >60 05/20/2022 2222   GFRNONAA >60 08/28/2021 1441   GFRNONAA 80 09/12/2020 1200   GFRAA 93 09/12/2020 1200    Lab Results  Component Value Date   WBC 5.9 01/12/2024   HGB 13.6 01/12/2024   HCT 41.5 01/12/2024   MCV 99.3 01/12/2024   PLT 213 01/12/2024   Lab Results  Component Value Date   CHOL 221 (H) 01/12/2024   HDL 136 01/12/2024   LDLCALC 71 01/12/2024   TRIG 62 01/12/2024   CHOLHDL 1.6 01/12/2024   Lab Results  Component Value Date   TSH 2.46 01/12/2024   Assessment & Plan:  Hypothyroidism treated with Levothyroxine 50 mcg daily before breakfast. 01/12/2024 TSH 2.46, elevated from 1.38 but still WNL.  Pure Hypercholesterolemia treated with Rosuvastatin 10 mg daily. 01/12/2024  Lipid Panel, compared to 06/2023 w/ Cholesterol 221, elevated from 206, otherwise WNL. Coronary Cardiac Score 94 in 2021.    Seizure disorder- treated with Keppra 500 mg BID and Lamictal 100 mg BID per Dr. Vickey Huger w/ Neurology. Unable to drive due to this disorder. Says that she does still have seizures sporadically, with an olfactory aura and a new symptom of tingling prior to seizure onset, and can usually prevent them by taking 1-2 Xanax during those auras.   Squamous Cell Carcinoma (SCC) of Cervix followed by GYN Oncology, who she saw in 08/2023.  Depression followed by Ellis Savage treated with Prozac 60 mg daily; Situational Stress discussed.  Varus Arthritis, Right Ankle; Lateral Ligament Reconstruction in 10/2018. Says that she is having difficulty walking again, recently saw Dr. Toni Arthurs in February for this.   Chronic insomnia treated with Ambien for years    Other labs reviewed: CBC and C-MET were WNL.  Vaccine Counseling: Due for PNA and Shingles 1/2 - postponed   Mammogram ordered.   Colo-guard NEGATIVE 09/23/2023.  I,Emily Lagle,acting as a Neurosurgeon for Margaree Mackintosh, MD.,have documented all relevant documentation on the behalf of Margaree Mackintosh, MD,as directed by  Margaree Mackintosh, MD while in the presence of Margaree Mackintosh, MD.   I, Margaree Mackintosh, MD, have reviewed all documentation for this visit. The documentation on 01/15/24 for the exam, diagnosis, procedures, and orders are all accurate and complete.

## 2024-01-12 ENCOUNTER — Other Ambulatory Visit: Payer: Self-pay

## 2024-01-12 ENCOUNTER — Other Ambulatory Visit: Payer: 59

## 2024-01-12 DIAGNOSIS — E7800 Pure hypercholesterolemia, unspecified: Secondary | ICD-10-CM | POA: Diagnosis not present

## 2024-01-12 DIAGNOSIS — E039 Hypothyroidism, unspecified: Secondary | ICD-10-CM

## 2024-01-12 DIAGNOSIS — R748 Abnormal levels of other serum enzymes: Secondary | ICD-10-CM

## 2024-01-12 DIAGNOSIS — R7989 Other specified abnormal findings of blood chemistry: Secondary | ICD-10-CM | POA: Diagnosis not present

## 2024-01-12 DIAGNOSIS — D500 Iron deficiency anemia secondary to blood loss (chronic): Secondary | ICD-10-CM

## 2024-01-12 NOTE — Progress Notes (Signed)
 Lab only

## 2024-01-13 LAB — COMPREHENSIVE METABOLIC PANEL
AG Ratio: 2.3 (calc) (ref 1.0–2.5)
ALT: 15 U/L (ref 6–29)
AST: 23 U/L (ref 10–35)
Albumin: 4.6 g/dL (ref 3.6–5.1)
Alkaline phosphatase (APISO): 82 U/L (ref 37–153)
BUN: 10 mg/dL (ref 7–25)
CO2: 29 mmol/L (ref 20–32)
Calcium: 9.5 mg/dL (ref 8.6–10.4)
Chloride: 101 mmol/L (ref 98–110)
Creat: 0.85 mg/dL (ref 0.50–1.03)
Globulin: 2 g/dL (ref 1.9–3.7)
Glucose, Bld: 99 mg/dL (ref 65–139)
Potassium: 4 mmol/L (ref 3.5–5.3)
Sodium: 141 mmol/L (ref 135–146)
Total Bilirubin: 0.6 mg/dL (ref 0.2–1.2)
Total Protein: 6.6 g/dL (ref 6.1–8.1)
eGFR: 79 mL/min/{1.73_m2} (ref >=60)

## 2024-01-13 LAB — CBC WITH DIFFERENTIAL/PLATELET
Absolute Lymphocytes: 1038 {cells}/uL (ref 850–3900)
Absolute Monocytes: 502 {cells}/uL (ref 200–950)
Basophils Absolute: 30 {cells}/uL (ref 0–200)
Basophils Relative: 0.5 %
Eosinophils Absolute: 83 {cells}/uL (ref 15–500)
Eosinophils Relative: 1.4 %
HCT: 41.5 % (ref 35.0–45.0)
Hemoglobin: 13.6 g/dL (ref 11.7–15.5)
MCH: 32.5 pg (ref 27.0–33.0)
MCHC: 32.8 g/dL (ref 32.0–36.0)
MCV: 99.3 fL (ref 80.0–100.0)
MPV: 9.5 fL (ref 7.5–12.5)
Monocytes Relative: 8.5 %
Neutro Abs: 4248 {cells}/uL (ref 1500–7800)
Neutrophils Relative %: 72 %
Platelets: 213 10*3/uL (ref 140–400)
RBC: 4.18 10*6/uL (ref 3.80–5.10)
RDW: 11.7 % (ref 11.0–15.0)
Total Lymphocyte: 17.6 %
WBC: 5.9 10*3/uL (ref 3.8–10.8)

## 2024-01-13 LAB — LIPID PANEL
Cholesterol: 221 mg/dL — ABNORMAL HIGH (ref <200)
HDL: 136 mg/dL (ref >=50)
LDL Cholesterol (Calc): 71 mg/dL
Non-HDL Cholesterol (Calc): 85 mg/dL (ref <130)
Total CHOL/HDL Ratio: 1.6 (calc) (ref <5.0)
Triglycerides: 62 mg/dL (ref <150)

## 2024-01-13 LAB — TSH: TSH: 2.46 m[IU]/L (ref 0.40–4.50)

## 2024-01-14 ENCOUNTER — Ambulatory Visit (INDEPENDENT_AMBULATORY_CARE_PROVIDER_SITE_OTHER): Payer: 59 | Admitting: Internal Medicine

## 2024-01-14 ENCOUNTER — Encounter: Payer: Self-pay | Admitting: Internal Medicine

## 2024-01-14 VITALS — BP 116/74 | HR 86 | Temp 98.3°F | Wt 177.0 lb

## 2024-01-14 DIAGNOSIS — Z0000 Encounter for general adult medical examination without abnormal findings: Secondary | ICD-10-CM | POA: Diagnosis not present

## 2024-01-14 DIAGNOSIS — M19071 Primary osteoarthritis, right ankle and foot: Secondary | ICD-10-CM | POA: Diagnosis not present

## 2024-01-14 DIAGNOSIS — R7989 Other specified abnormal findings of blood chemistry: Secondary | ICD-10-CM | POA: Diagnosis not present

## 2024-01-14 DIAGNOSIS — E7800 Pure hypercholesterolemia, unspecified: Secondary | ICD-10-CM | POA: Diagnosis not present

## 2024-01-14 DIAGNOSIS — F32A Depression, unspecified: Secondary | ICD-10-CM | POA: Diagnosis not present

## 2024-01-14 DIAGNOSIS — E039 Hypothyroidism, unspecified: Secondary | ICD-10-CM

## 2024-01-14 DIAGNOSIS — F5101 Primary insomnia: Secondary | ICD-10-CM | POA: Diagnosis not present

## 2024-01-14 DIAGNOSIS — G40909 Epilepsy, unspecified, not intractable, without status epilepticus: Secondary | ICD-10-CM | POA: Diagnosis not present

## 2024-01-14 DIAGNOSIS — C539 Malignant neoplasm of cervix uteri, unspecified: Secondary | ICD-10-CM | POA: Diagnosis not present

## 2024-01-14 DIAGNOSIS — F419 Anxiety disorder, unspecified: Secondary | ICD-10-CM | POA: Diagnosis not present

## 2024-01-14 DIAGNOSIS — F439 Reaction to severe stress, unspecified: Secondary | ICD-10-CM | POA: Diagnosis not present

## 2024-01-15 NOTE — Patient Instructions (Addendum)
 Situational stress with husband discussed.  Patient has seizure disorder and continues to have transportation issues due to not being able to drive.  Usually takes an Pharmacist, community for transportation.  Continue follow-up with neurology for history of seizure disorder.  Having issues with right ankle being seen by Dr. Victorino Dike.  Has been on Ambien for years for insomnia and this can be refilled as needed.  Discussed pneumonia and shingles vaccines but patient declined pneumonia vaccine today.  Cologuard was negative in November 2024.  Have ordered mammogram.  Being followed for squamous cell carcinoma of the cervix by GYN oncology.  Continue same dose of thyroid replacement.  Patient will follow-up again in October.

## 2024-01-15 NOTE — Progress Notes (Incomplete)
 Patient Care Team: Margaree Mackintosh, MD as PCP - General (Internal Medicine) Meriam Sprague, MD (Inactive) as PCP - Cardiology (Cardiology)  Visit Date: 01/14/24  Subjective:   Chief Complaint  Patient presents with  . Medical Management of Chronic Issues  Patient Ashley Hammond,Female DOB:Jan 27, 1966,58 y.o. QMV:[id number]   58 y.o. Female presents today for 6 months follow-up for Hypothyroidism. Patient has a past medical history of Hypothyroidism, seizure disorder, SCC of Cervix, HLD, and Depression. Last seen 07/17/2023 for her annual visit, in the interim she has seen  Dr. Eugene Garnet w/ Gyn-Onc, Ellis Savage for anxiety/depression, Dr, Porfirio Mylar Dohmeier w/ Neurology for seizure management, and Dr. Toni Arthurs w/ [facility].   History of Hypothyroidism treated with Levothyroxine 50 mcg daily before breakfast. 01/12/2024 TSH 2.46, elevated from 1.38 but still WNL.Continue same dose of thyroid replacement.  History of Pure Hypercholesterolemia treated with Rosuvastatin 10 mg daily. 01/12/2024 Lipid Panel, compared to 06/2023 w/ Cholesterol 221, elevated from 206, otherwise WNL. Coronary Cardiac Score 94 in 2021.    History of Epilepsy treated with Keppra 500 mg BID and Lamictal 100 mg BID per Dr. Vickey Huger w/ Neurology. Unable to drive due to this disorder. Says that she does still have seizures sporadically, with an olfactory aura and a new symptom of tingling prior to seizure onset, and can usually prevent them by taking 1-2 Xanax during those auras. Uses [employer] for transportation.  History of Squamous Cell Carcinoma (SCC) of Cervix followed by GYN Oncology,  was seen 08/2023.    History of Depression followed by Ellis Savage treated with Prozac 60 mg daily; History of Situational Stress discussed.  History of Varus Arthritis, Right Ankle; Lateral Ligament Reconstruction in 10/2018. Says that she is having difficulty walking again, recently saw Dr. Toni Arthurs in February for  this.     Other labs reviewed: CBC and C-MET were WNL.  Vaccine Counseling: Due for PNA and Shingles 1/2 - postponed   Mammogram ordered.   Colo-guard NEGATIVE 09/23/2023.  Past Medical History:  Diagnosis Date  . Anxiety   . Arthritis   . Arthritis of ankle    Right  . Cervical cancer (HCC) 2020  . Depression   . Episode of loss of consciousness   . History of radiation therapy    Pelvis 07/31/2021-09/03/2021  HDR tandem and ring 09/26/2021  Dr Antony Blackbird  . Hypothyroidism   . Insomnia   . Pure hypercholesterolemia   . Seizure (HCC)   No Known Allergies  Family History  Problem Relation Age of Onset  . Colon polyps Mother   . Heart attack Father   . Diabetes Brother   . Pancreatic cancer Maternal Grandmother   . Sudden death Neg Hx   . Hypertension Neg Hx   . Breast cancer Neg Hx   . Colon cancer Neg Hx   . Seizures Neg Hx    Social History   Social History Narrative   Lives alone   R handed   Caffeine: 1 C of coffee a day   Retired    Review of Systems  Constitutional:  Negative for fever and malaise/fatigue.  HENT:  Negative for congestion.   Eyes:  Negative for blurred vision.  Respiratory:  Negative for cough and shortness of breath.   Cardiovascular:  Negative for chest pain, palpitations and leg swelling.  Gastrointestinal:  Negative for vomiting.  Musculoskeletal:  Negative for back pain.  Skin:  Negative for rash.  Neurological:  Negative for loss of  consciousness and headaches.     Objective:  Vitals: BP 116/74   Pulse 86   Temp 98.3 F (36.8 C)   Wt 177 lb (80.3 kg)   SpO2 97%   BMI 26.91 kg/m   Physical Exam Vitals and nursing note reviewed.  Constitutional:      General: She is not in acute distress.    Appearance: Normal appearance. She is not toxic-appearing.  HENT:     Head: Normocephalic and atraumatic.  Cardiovascular:     Rate and Rhythm: Normal rate and regular rhythm. No extrasystoles are present.    Pulses: Normal pulses.      Heart sounds: Normal heart sounds. No murmur heard.    No friction rub. No gallop.  Pulmonary:     Effort: Pulmonary effort is normal. No respiratory distress.     Breath sounds: Normal breath sounds. No wheezing or rales.  Skin:    General: Skin is warm and dry.  Neurological:     Mental Status: She is alert and oriented to person, place, and time. Mental status is at baseline.  Psychiatric:        Mood and Affect: Mood normal.        Behavior: Behavior normal.        Thought Content: Thought content normal.        Judgment: Judgment normal.     Results:  Studies Obtained And Personally Reviewed By Me:  Colo-guard NEGATIVE 09/23/2023.  Labs:     Component Value Date/Time   NA 141 01/12/2024 1205   K 4.0 01/12/2024 1205   CL 101 01/12/2024 1205   CO2 29 01/12/2024 1205   GLUCOSE 99 01/12/2024 1205   BUN 10 01/12/2024 1205   CREATININE 0.85 01/12/2024 1205   CALCIUM 9.5 01/12/2024 1205   PROT 6.6 01/12/2024 1205   ALBUMIN 4.5 07/25/2021 1725   AST 23 01/12/2024 1205   ALT 15 01/12/2024 1205   ALKPHOS 70 07/25/2021 1725   BILITOT 0.6 01/12/2024 1205   GFRNONAA >60 05/20/2022 2222   GFRNONAA >60 08/28/2021 1441   GFRNONAA 80 09/12/2020 1200   GFRAA 93 09/12/2020 1200    Lab Results  Component Value Date   WBC 5.9 01/12/2024   HGB 13.6 01/12/2024   HCT 41.5 01/12/2024   MCV 99.3 01/12/2024   PLT 213 01/12/2024   Lab Results  Component Value Date   CHOL 221 (H) 01/12/2024   HDL 136 01/12/2024   LDLCALC 71 01/12/2024   TRIG 62 01/12/2024   CHOLHDL 1.6 01/12/2024   Lab Results  Component Value Date   TSH 2.46 01/12/2024   Assessment & Plan:  Hypothyroidism treated with Levothyroxine 50 mcg daily before breakfast. 01/12/2024 TSH 2.46, elevated from 1.38 but still WNL.  Pure Hypercholesterolemia treated with Rosuvastatin 10 mg daily. 01/12/2024 Lipid Panel, compared to 06/2023 w/ Cholesterol 221, elevated from 206, otherwise WNL. Coronary Cardiac Score 94 in  2021.    Epilepsy treated with Keppra 500 mg BID and Lamictal 100 mg BID per Dr. Vickey Huger w/ Neurology. Unable to drive due to this disorder. Says that she does still have seizures sporadically, with an olfactory aura and a new symptom of tingling prior to seizure onset, and can usually prevent them by taking 1-2 Xanax during those auras.   Squamous Cell Carcinoma (SCC) of Cervix followed by GYN Oncology, who she saw in 08/2023.    Depression followed by Ellis Savage treated with Prozac 60 mg daily; Situational Stress discussed.  Varus  Arthritis, Right Ankle; Lateral Ligament Reconstruction in 10/2018. Says that she is having difficulty walking again, recently saw Dr. Toni Arthurs in February for this.     Other labs reviewed: CBC and C-MET were WNL.  Vaccine Counseling: Due for PNA and Shingles 1/2 - postponed   Mammogram ordered.   Colo-guard NEGATIVE 09/23/2023.  I,Emily Lagle,acting as a Neurosurgeon for Margaree Mackintosh, MD.,have documented all relevant documentation on the behalf of Margaree Mackintosh, MD,as directed by  Margaree Mackintosh, MD while in the presence of Margaree Mackintosh, MD.   ***

## 2024-01-26 ENCOUNTER — Other Ambulatory Visit (HOSPITAL_COMMUNITY): Payer: Self-pay

## 2024-01-26 ENCOUNTER — Encounter: Payer: Self-pay | Admitting: Psychology

## 2024-01-28 ENCOUNTER — Other Ambulatory Visit: Payer: Self-pay

## 2024-01-28 ENCOUNTER — Other Ambulatory Visit: Payer: Self-pay | Admitting: Internal Medicine

## 2024-01-28 ENCOUNTER — Other Ambulatory Visit (HOSPITAL_COMMUNITY): Payer: Self-pay

## 2024-01-29 ENCOUNTER — Other Ambulatory Visit (HOSPITAL_COMMUNITY): Payer: Self-pay

## 2024-01-29 MED ORDER — LEVOTHYROXINE SODIUM 50 MCG PO TABS
50.0000 ug | ORAL_TABLET | Freq: Every day | ORAL | 1 refills | Status: DC
  Filled 2024-01-29: qty 90, 90d supply, fill #0
  Filled 2024-02-05: qty 30, 30d supply, fill #0
  Filled 2024-03-10: qty 30, 30d supply, fill #1
  Filled 2024-04-05: qty 30, 30d supply, fill #2
  Filled 2024-04-26 – 2024-05-02 (×2): qty 30, 30d supply, fill #3
  Filled 2024-06-16: qty 30, 30d supply, fill #4
  Filled 2024-07-19: qty 30, 30d supply, fill #5

## 2024-02-05 ENCOUNTER — Other Ambulatory Visit: Payer: Self-pay

## 2024-02-05 ENCOUNTER — Other Ambulatory Visit (HOSPITAL_COMMUNITY): Payer: Self-pay

## 2024-02-08 ENCOUNTER — Telehealth: Payer: Self-pay | Admitting: *Deleted

## 2024-02-08 NOTE — Telephone Encounter (Signed)
 LMOM for the patient to call the office back. Per provider need to move appt from 8/1 at 1:30 pm to another day in the month.

## 2024-02-08 NOTE — Telephone Encounter (Signed)
 Patient called back and moved appt from 8/1 to 8/15

## 2024-02-18 ENCOUNTER — Other Ambulatory Visit (HOSPITAL_COMMUNITY): Payer: Self-pay

## 2024-02-22 ENCOUNTER — Other Ambulatory Visit: Payer: Self-pay | Admitting: Neurology

## 2024-02-22 ENCOUNTER — Other Ambulatory Visit (HOSPITAL_COMMUNITY): Payer: Self-pay

## 2024-02-22 NOTE — Telephone Encounter (Signed)
 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 Reports stress induced attacks that share the same aura and could be anxiety driven. Both respond to xanax .  I refilled xanax .  Follow up scheduled on 12/01/24   Dispensed Days Supply Quantity Provider Pharmacy  ALPRAZolam  (XANAX ) 0.5 MG tablet 01/11/2024 30 30 tablet Dohmeier, Raoul Byes, MD [place] [facility]...      Rx pending to be signed

## 2024-02-23 ENCOUNTER — Other Ambulatory Visit: Payer: Self-pay

## 2024-02-23 MED ORDER — ALPRAZOLAM 0.5 MG PO TABS
0.5000 mg | ORAL_TABLET | Freq: Every day | ORAL | 1 refills | Status: DC | PRN
  Filled 2024-02-23: qty 30, 30d supply, fill #0
  Filled 2024-04-19: qty 30, 30d supply, fill #1

## 2024-02-23 MED ORDER — ALPRAZOLAM 0.5 MG PO TABS
0.5000 mg | ORAL_TABLET | Freq: Every day | ORAL | 1 refills | Status: DC | PRN
Start: 2024-02-23 — End: 2024-02-23
  Filled 2024-02-23: qty 30, 30d supply, fill #0

## 2024-02-24 ENCOUNTER — Other Ambulatory Visit (HOSPITAL_COMMUNITY): Payer: Self-pay

## 2024-02-24 ENCOUNTER — Other Ambulatory Visit: Payer: Self-pay

## 2024-02-29 ENCOUNTER — Other Ambulatory Visit (HOSPITAL_COMMUNITY): Payer: Self-pay

## 2024-03-10 ENCOUNTER — Other Ambulatory Visit (HOSPITAL_COMMUNITY): Payer: Self-pay

## 2024-03-26 ENCOUNTER — Other Ambulatory Visit (HOSPITAL_COMMUNITY): Payer: Self-pay

## 2024-04-05 ENCOUNTER — Other Ambulatory Visit (HOSPITAL_COMMUNITY): Payer: Self-pay

## 2024-04-20 ENCOUNTER — Other Ambulatory Visit: Payer: Self-pay

## 2024-04-26 ENCOUNTER — Other Ambulatory Visit (HOSPITAL_COMMUNITY): Payer: Self-pay

## 2024-04-27 ENCOUNTER — Other Ambulatory Visit: Payer: Self-pay

## 2024-05-05 ENCOUNTER — Other Ambulatory Visit: Payer: Self-pay

## 2024-05-11 ENCOUNTER — Other Ambulatory Visit (HOSPITAL_COMMUNITY): Payer: Self-pay

## 2024-05-11 ENCOUNTER — Other Ambulatory Visit: Payer: Self-pay

## 2024-05-11 DIAGNOSIS — F332 Major depressive disorder, recurrent severe without psychotic features: Secondary | ICD-10-CM | POA: Diagnosis not present

## 2024-05-11 MED ORDER — ZOLPIDEM TARTRATE 10 MG PO TABS
ORAL_TABLET | ORAL | 5 refills | Status: DC
  Filled 2024-05-11: qty 30, 30d supply, fill #0

## 2024-05-11 MED ORDER — DESVENLAFAXINE SUCCINATE ER 50 MG PO TB24
50.0000 mg | ORAL_TABLET | Freq: Every morning | ORAL | 2 refills | Status: DC
  Filled 2024-05-11: qty 30, 30d supply, fill #0
  Filled 2024-05-12: qty 30, 30d supply, fill #1

## 2024-05-11 MED ORDER — LAMOTRIGINE 200 MG PO TABS
200.0000 mg | ORAL_TABLET | Freq: Two times a day (BID) | ORAL | 2 refills | Status: DC
  Filled 2024-05-11: qty 60, 30d supply, fill #0
  Filled 2024-06-14: qty 60, 30d supply, fill #1
  Filled 2024-07-19: qty 60, 30d supply, fill #2

## 2024-05-11 MED ORDER — ALPRAZOLAM 0.5 MG PO TABS
0.5000 mg | ORAL_TABLET | Freq: Every day | ORAL | 5 refills | Status: AC | PRN
  Filled 2024-07-06: qty 30, 30d supply, fill #0
  Filled 2024-08-25: qty 30, 30d supply, fill #1
  Filled 2024-10-30: qty 30, 30d supply, fill #2

## 2024-05-12 ENCOUNTER — Other Ambulatory Visit (HOSPITAL_COMMUNITY): Payer: Self-pay

## 2024-05-27 ENCOUNTER — Other Ambulatory Visit (HOSPITAL_COMMUNITY): Payer: Self-pay

## 2024-05-27 ENCOUNTER — Other Ambulatory Visit: Payer: Self-pay

## 2024-05-27 ENCOUNTER — Ambulatory Visit: Payer: 59 | Admitting: Gynecologic Oncology

## 2024-05-27 DIAGNOSIS — F331 Major depressive disorder, recurrent, moderate: Secondary | ICD-10-CM | POA: Diagnosis not present

## 2024-05-27 MED ORDER — ALPRAZOLAM 0.5 MG PO TABS
0.5000 mg | ORAL_TABLET | Freq: Every day | ORAL | 5 refills | Status: DC
  Filled 2024-05-27: qty 30, 30d supply, fill #0

## 2024-05-27 MED ORDER — LAMOTRIGINE 200 MG PO TABS: 200.0000 mg | ORAL_TABLET | Freq: Two times a day (BID) | ORAL | 2 refills | Status: DC

## 2024-05-27 MED ORDER — ZOLPIDEM TARTRATE 10 MG PO TABS: 10.0000 mg | ORAL_TABLET | Freq: Every evening | ORAL | 5 refills | Status: DC

## 2024-05-27 MED ORDER — DESVENLAFAXINE SUCCINATE ER 50 MG PO TB24: 50.0000 mg | ORAL_TABLET | Freq: Every morning | ORAL | 2 refills | Status: DC

## 2024-05-30 ENCOUNTER — Other Ambulatory Visit (HOSPITAL_COMMUNITY): Payer: Self-pay

## 2024-06-07 ENCOUNTER — Other Ambulatory Visit (HOSPITAL_COMMUNITY): Payer: Self-pay

## 2024-06-07 ENCOUNTER — Encounter: Payer: Self-pay | Admitting: Gynecologic Oncology

## 2024-06-07 ENCOUNTER — Telehealth: Payer: Self-pay | Admitting: Neurology

## 2024-06-07 MED ORDER — LEVETIRACETAM 500 MG PO TABS
500.0000 mg | ORAL_TABLET | Freq: Two times a day (BID) | ORAL | 1 refills | Status: AC
  Filled 2024-06-07: qty 60, 30d supply, fill #0
  Filled 2024-07-06: qty 60, 30d supply, fill #1
  Filled 2024-07-07 – 2024-08-02 (×2): qty 60, 30d supply, fill #2
  Filled 2024-09-06: qty 60, 30d supply, fill #3
  Filled 2024-10-17: qty 60, 30d supply, fill #4
  Filled 2024-11-04: qty 60, 30d supply, fill #5

## 2024-06-07 NOTE — Telephone Encounter (Signed)
 Pt called to do medication request the patient is currently out of Medication  and not sure if it okay to go without  medication  levETIRAcetam  (KEPPRA ) 500 MG tablet     Pt would like medication to be sent to   home  if possible Pt is not able to leave home at this time

## 2024-06-07 NOTE — Telephone Encounter (Signed)
 Refill sent to [facility]. Placed in pharmacy not that she would like mail delivery

## 2024-06-09 ENCOUNTER — Telehealth: Payer: Self-pay

## 2024-06-09 NOTE — Telephone Encounter (Signed)
 Ashley Hammond called to reschedule her upcoming appointment for tomorrow 8/15 with Dr.Tucker    Scheduled for first available of 9/25 @ 3:00. Pt agreed to date/time

## 2024-06-10 ENCOUNTER — Inpatient Hospital Stay: Admitting: Gynecologic Oncology

## 2024-06-10 DIAGNOSIS — C539 Malignant neoplasm of cervix uteri, unspecified: Secondary | ICD-10-CM

## 2024-06-14 ENCOUNTER — Other Ambulatory Visit (HOSPITAL_COMMUNITY): Payer: Self-pay

## 2024-06-16 ENCOUNTER — Other Ambulatory Visit (HOSPITAL_COMMUNITY): Payer: Self-pay

## 2024-06-20 DIAGNOSIS — M25571 Pain in right ankle and joints of right foot: Secondary | ICD-10-CM | POA: Diagnosis not present

## 2024-06-24 ENCOUNTER — Telehealth: Payer: Self-pay | Admitting: *Deleted

## 2024-06-24 NOTE — Telephone Encounter (Signed)
 LMOM for the patient to call the office back. Need to move appt from 9/25 to 10/3 @ 2 pm

## 2024-06-28 ENCOUNTER — Other Ambulatory Visit: Payer: Self-pay | Admitting: Neurology

## 2024-06-28 ENCOUNTER — Other Ambulatory Visit: Payer: Self-pay | Admitting: Internal Medicine

## 2024-06-28 NOTE — Telephone Encounter (Signed)
 LMOM for the patient to call the office back. Need to move appt from 9/25 to 10/3 @ 2 pm

## 2024-06-29 ENCOUNTER — Other Ambulatory Visit (HOSPITAL_COMMUNITY): Payer: Self-pay

## 2024-06-29 MED ORDER — ZOLPIDEM TARTRATE 10 MG PO TABS
10.0000 mg | ORAL_TABLET | Freq: Every evening | ORAL | 0 refills | Status: AC | PRN
  Filled 2024-07-06: qty 90, 90d supply, fill #0

## 2024-06-29 NOTE — Telephone Encounter (Signed)
 LMOM for the patient to call the office back. Need to move appt from 9/25 to 10/3 @ 2 pm

## 2024-06-30 NOTE — Telephone Encounter (Signed)
 Sent patient a my chart message regarding the appt change. Patient aware

## 2024-07-01 ENCOUNTER — Other Ambulatory Visit (HOSPITAL_COMMUNITY): Payer: Self-pay

## 2024-07-04 DIAGNOSIS — M19171 Post-traumatic osteoarthritis, right ankle and foot: Secondary | ICD-10-CM | POA: Diagnosis not present

## 2024-07-04 DIAGNOSIS — M21171 Varus deformity, not elsewhere classified, right ankle: Secondary | ICD-10-CM | POA: Diagnosis not present

## 2024-07-04 DIAGNOSIS — M21861 Other specified acquired deformities of right lower leg: Secondary | ICD-10-CM | POA: Diagnosis not present

## 2024-07-04 DIAGNOSIS — M25571 Pain in right ankle and joints of right foot: Secondary | ICD-10-CM | POA: Diagnosis not present

## 2024-07-04 DIAGNOSIS — M216X1 Other acquired deformities of right foot: Secondary | ICD-10-CM | POA: Diagnosis not present

## 2024-07-04 DIAGNOSIS — M25371 Other instability, right ankle: Secondary | ICD-10-CM | POA: Diagnosis not present

## 2024-07-05 ENCOUNTER — Other Ambulatory Visit (HOSPITAL_COMMUNITY): Payer: Self-pay

## 2024-07-06 ENCOUNTER — Other Ambulatory Visit (HOSPITAL_COMMUNITY): Payer: Self-pay

## 2024-07-06 ENCOUNTER — Encounter: Payer: Self-pay | Admitting: Neurology

## 2024-07-06 ENCOUNTER — Other Ambulatory Visit: Payer: Self-pay

## 2024-07-06 MED ORDER — NAYZILAM 5 MG/0.1ML NA SOLN
5.0000 mg | NASAL | 2 refills | Status: AC | PRN
  Filled 2024-07-06: qty 2, 5d supply, fill #0

## 2024-07-07 ENCOUNTER — Other Ambulatory Visit (HOSPITAL_COMMUNITY): Payer: Self-pay

## 2024-07-07 ENCOUNTER — Other Ambulatory Visit: Payer: Self-pay

## 2024-07-07 ENCOUNTER — Encounter: Payer: Self-pay | Admitting: Pharmacist

## 2024-07-08 ENCOUNTER — Other Ambulatory Visit: Payer: Self-pay

## 2024-07-11 DIAGNOSIS — X58XXXD Exposure to other specified factors, subsequent encounter: Secondary | ICD-10-CM | POA: Diagnosis not present

## 2024-07-11 DIAGNOSIS — S82891D Other fracture of right lower leg, subsequent encounter for closed fracture with routine healing: Secondary | ICD-10-CM | POA: Diagnosis not present

## 2024-07-12 ENCOUNTER — Other Ambulatory Visit: Payer: Self-pay

## 2024-07-18 ENCOUNTER — Ambulatory Visit: Admitting: Psychology

## 2024-07-19 ENCOUNTER — Other Ambulatory Visit: Payer: Self-pay

## 2024-07-19 ENCOUNTER — Other Ambulatory Visit (HOSPITAL_COMMUNITY): Payer: Self-pay

## 2024-07-19 ENCOUNTER — Other Ambulatory Visit: Payer: Self-pay | Admitting: Internal Medicine

## 2024-07-19 MED ORDER — ROSUVASTATIN CALCIUM 10 MG PO TABS
10.0000 mg | ORAL_TABLET | Freq: Every day | ORAL | 3 refills | Status: AC
  Filled 2024-07-19: qty 30, 30d supply, fill #0
  Filled 2024-08-25: qty 30, 30d supply, fill #1
  Filled 2024-09-20: qty 30, 30d supply, fill #2
  Filled 2024-10-30: qty 30, 30d supply, fill #3
  Filled 2024-11-26: qty 30, 30d supply, fill #4

## 2024-07-21 ENCOUNTER — Ambulatory Visit: Admitting: Gynecologic Oncology

## 2024-07-26 ENCOUNTER — Other Ambulatory Visit (HOSPITAL_COMMUNITY): Payer: Self-pay

## 2024-07-29 ENCOUNTER — Inpatient Hospital Stay: Attending: Gynecologic Oncology | Admitting: Gynecologic Oncology

## 2024-07-29 ENCOUNTER — Encounter: Payer: Self-pay | Admitting: Gynecologic Oncology

## 2024-07-29 VITALS — BP 126/69 | HR 92 | Temp 98.4°F | Resp 16 | Ht 68.0 in | Wt 174.0 lb

## 2024-07-29 DIAGNOSIS — Z1231 Encounter for screening mammogram for malignant neoplasm of breast: Secondary | ICD-10-CM

## 2024-07-29 DIAGNOSIS — Z9221 Personal history of antineoplastic chemotherapy: Secondary | ICD-10-CM | POA: Insufficient documentation

## 2024-07-29 DIAGNOSIS — R1110 Vomiting, unspecified: Secondary | ICD-10-CM | POA: Diagnosis not present

## 2024-07-29 DIAGNOSIS — C539 Malignant neoplasm of cervix uteri, unspecified: Secondary | ICD-10-CM

## 2024-07-29 DIAGNOSIS — Z8541 Personal history of malignant neoplasm of cervix uteri: Secondary | ICD-10-CM | POA: Diagnosis not present

## 2024-07-29 DIAGNOSIS — Z923 Personal history of irradiation: Secondary | ICD-10-CM | POA: Diagnosis not present

## 2024-07-29 DIAGNOSIS — Z90722 Acquired absence of ovaries, bilateral: Secondary | ICD-10-CM | POA: Diagnosis not present

## 2024-07-29 DIAGNOSIS — Z9079 Acquired absence of other genital organ(s): Secondary | ICD-10-CM | POA: Diagnosis not present

## 2024-07-29 DIAGNOSIS — Z90710 Acquired absence of both cervix and uterus: Secondary | ICD-10-CM | POA: Diagnosis not present

## 2024-07-29 DIAGNOSIS — Z1151 Encounter for screening for human papillomavirus (HPV): Secondary | ICD-10-CM | POA: Insufficient documentation

## 2024-07-29 NOTE — Progress Notes (Signed)
 Gynecologic Oncology Return Clinic Visit  07/29/24  Reason for Visit: Surveillance visit in the setting of cervical cancer   Treatment History: Oncology History Overview Note  PD-L1 CPS 0%   Malignant neoplasm of cervix (HCC)  05/20/2021 Initial Diagnosis   The patient reported a history of light vaginal bleeding since approximately May 2022.  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  Due to the abnormal bleeding that she was receiving she was then recommended to follow-up with her gynecologist, Dr. Jertson, who first evaluated the endometrium with an endometrial biopsy performed on 05/20/2021 which revealed benign inactive atrophic endometrium negative for hyperplasia or malignancy.  The bleeding continued and the patient returned for a colposcopic evaluation of the cervix with biopsies (due to the presence of high risk HPV).  This took place on 06/17/2021.  At the time of that colposcopic evaluation there was some abnormal bleeding noted and friability to the cervix.  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   05/20/2021 Pathology Results   FINAL MICROSCOPIC DIAGNOSIS:   A. ENDOMETRIUM, BIOPSY:  - Benign inactive atrophic endometrium  - Negative for hyperplasia or malignancy   05/30/2021 Imaging   US  pelvis Small anteverted uterus Thin, symmetrical endometrium Normal ovaries bilaterally No free fluid.   06/17/2021 Pathology Results   FINAL MICROSCOPIC DIAGNOSIS:   A. CERVIX, 6 O'CLOCK, BIOPSY:  -  Invasive squamous cell carcinoma  -  See comment   B. ENDOCERVIX, CURETTAGE:  -  Squamous cell carcinoma  -  See comment   COMMENT:   A.  The carcinoma is positive for p16 and p40 supporting the diagnosis  of invasive squamous cell carcinoma.     07/11/2021 PET scan   Intense hypermetabolic activity associated with ill-defined nodular enlargement of the cervix/lower uterine segment, consistent with patient's biopsy proven cervical neoplasm.   No definite scintigraphic evidence of metastatic disease in the neck, chest, abdomen or pelvis.   Right middle lobe 4-5 mm perifissural pulmonary nodule unchanged in size from October 25, 2020 and below the resolution of PET-CT but favored to reflect a peri- fissural lymph node, attention on follow-up studies suggested   Nodular focus of hypermetabolic activity along the right anterior sublingual region, which is nonspecific correlation with direct visualization is suggested.   Hypermetabolic hyperplasia of the tonsils with a hypermetabolic 5 mm left level 1b cervical lymph node, favored reactive.   Hypermetabolic activity associated with callus formation in the right anterior sixth rib likely representing a healing nonpathologic rib fracture.   07/19/2021 Initial Diagnosis   Malignant neoplasm of cervix (HCC)   07/23/2021 Cancer Staging   Staging form: Cervix Uteri, AJCC Version 9 - Clinical stage from 07/23/2021: FIGO Stage IB3 (cT1b3, cN0, cM0) - Signed by Lonn Hicks, MD on 07/23/2021 Stage prefix: Initial diagnosis   07/25/2021 Imaging   1. No acute intracranial abnormality. 2. Minimal multifocal hyperintense T2-weight signal within the white matter, nonspecific. This may be seen in the setting of chronic small vessel disease or migraine headaches.     07/25/2021 Imaging   CT head No acute intracranial abnormality.   07/30/2021 Procedure   Successful placement of a power injectable Port-A-Cath via the right internal jugular vein. The catheter is ready for immediate  use.     08/02/2021 - 08/30/2021 Chemotherapy   Patient is on Treatment Plan : Cervical Cisplatin  q7d     10/24/2021 Surgery   TRH/BSO  Findings: On EUA, small mobile uterus. What  appears to be residual tumor (approximately 1cm) replaces the cervical os. On intra-abdominal entry, normal upper abdominal survey. Normal omentum, small and large bowel. Uterus 6cm, normal appearing adnexa. Mild retroperitoneal edema and fibrosis given recent RT. Moderate adhesions between bladder and cervix. Good visual margin taken around the cervix while trying to preserve as much vaginal length as possible. No intra-abdominal or pelvic evidence of disease.   10/24/2021 Pathology Results   A. UTERUS, CERVIX, BILATERAL FALLOPIAN TUBES AND OVARIES:  - Invasive adenosquamous carcinoma of the cervix, see comment  - Carcinoma invades for depth of 0.6 cm  - Resection margins are negative for carcinoma  - Endometriosis of the cervix  - Uterus with benign inactive endometrium  - Benign unremarkable bilateral fallopian tubes and ovaries  - See oncology table      COMMENT:   Most of the tumor shows a squamous morphology with patchy areas of  glandular differentiation. Immunostains show that the tumor cells are  positive for p16, CK5/6 and show evidence of mucin production with  mucicarmine special stain.  Immunostain for p63 is negative.  This  immunoprofile is consistent with above interpretation.     ONCOLOGY TABLE:   UTERINE CERVIX, CARCINOMA: Resection   Procedure: Total hysterectomy and bilateral salpingo-oophorectomy  Tumor Size: 0.8 cm (as measured on HE glass slide)  Histologic Type: Adenosquamous carcinoma  Histologic Grade: G3: Poorly differentiated  Stromal Invasion: Present       Depth of stromal invasion (mm): 6 mm  Other Tissue/ Organ: Not applicable  Margins: All margins negative for invasive carcinoma  Margin Status for HSIL or AIS: All margins negative for HSIL or AIS  Lymphovascular invasion: Not identified       Regional Lymph Nodes: Not applicable (no lymph nodes submitted or  found)  Distant Metastasis:       Distant sites involved: Not applicable  Pathologic  Stage Classification (pTNM, AJCC 8th Edition): pT1b1, pN not  assigned  Ancillary Studies: Can be performed upon request  Representative Tumor Block: A6    01/06/2022 Procedure   Removal of implanted Port-A-Cath utilizing sharp and blunt dissection. The procedure was uncomplicated.     02/13/2022 Imaging   CT A/P: 1. No evidence of metastatic disease in the abdomen or pelvis. 2. Small hiatal hernia. 3. Congenital small bowel malrotation.  No acute abnormality.     Interval History: Patient presents today to the office for continued follow-up.  She reports doing well from a gynecologic standpoint.  She has upcoming right major ankle surgery on August 10, 2024 [facility] which she is nervous about. She is tolerating her diet and reports eating smaller meals throughout the day.  She has noticed for the past 6 months having intermittent emesis which she wonders if this is related to her epilepsy diagnosis.  She thinks her last mammogram was probably in 2023.  No abdominal or pelvic pain.  No early satiety or abnormal bloating.  Bladder is functioning without difficulty with no hematuria or dysuria.  No vaginal bleeding or discharge reported.  Bowels are functioning at baseline.  No new lower extremity edema except for around the right ankle.  Past Medical/Surgical History: Past Medical History:  Diagnosis Date   Anxiety    Arthritis    Arthritis of  ankle    Right   Cervical cancer (HCC) 2020   Depression    Episode of loss of consciousness    History of radiation therapy    Pelvis 07/31/2021-09/03/2021  HDR tandem and ring 09/26/2021  Dr Lynwood Nasuti   Hypothyroidism    Insomnia    Pure hypercholesterolemia    Seizure [facility])     Past Surgical History:  Procedure Laterality Date   ABDOMINAL HYSTERECTOMY     ANKLE SURGERY Right    [facility]   IR IMAGING GUIDED PORT INSERTION  07/30/2021   IR REMOVAL TUN ACCESS W/ PORT W/O FL MOD SED  01/03/2022   OPERATIVE ULTRASOUND N/A 09/26/2021    Procedure: OPERATIVE ULTRASOUND;  Surgeon: Nasuti Lynwood, MD;  Location: [facility];  Service: Urology;  Laterality: N/A;   ROBOTIC ASSISTED TOTAL HYSTERECTOMY WITH BILATERAL SALPINGO OOPHERECTOMY Bilateral 10/24/2021   Procedure: XI ROBOTIC ASSISTED TOTAL HYSTERECTOMY WITH BILATERAL SALPINGO OOPHORECTOMY;  Surgeon: Viktoria Comer SAUNDERS, MD;  Location: [facility];  Service: Gynecology;  Laterality: Bilateral;   TANDEM RING INSERTION N/A 09/26/2021   Procedure: TANDEM RING INSERTION;  Surgeon: Nasuti Lynwood, MD;  Location: [facility];  Service: Urology;  Laterality: N/A;   WISDOM TOOTH EXTRACTION      Family History  Problem Relation Age of Onset   Colon polyps Mother    Heart attack Father    Diabetes Brother    Pancreatic cancer Maternal Grandmother    Sudden death Neg Hx    Hypertension Neg Hx    Breast cancer Neg Hx    Colon cancer Neg Hx    Seizures Neg Hx     Social History   Socioeconomic History   Marital status: Married    Spouse name: Not on file   Number of children: 2   Years of education: Not on file   Highest education level: Not on file  Occupational History   Not on file  Tobacco Use   Smoking status: Former    Current packs/day: 0.00    Average packs/day: 0.3 packs/day for 25.0 years (6.3 ttl pk-yrs)    Types: Cigarettes    Start date: 28    Quit date: 2016    Years since quitting: 9.7   Smokeless tobacco: Never   Tobacco comments:    20+ yrs  Vaping Use   Vaping status: Never Used  Substance and Sexual Activity   Alcohol use: Yes    Alcohol/week: 14.0 standard drinks of alcohol    Types: 14 Glasses of wine per week    Comment: 2 glasses of wine a day   Drug use: Yes    Frequency: 7.0 times per week    Types: Marijuana    Comment: Last use 07-10-21   Sexual activity: Not Currently    Birth control/protection: Post-menopausal  Other Topics Concern   Not on file  Social History Narrative   Lives alone   R handed   Caffeine: 1 C of coffee a day   Retired     Teacher, early years/pre Strain: Low Risk  (07/04/2024)   Received from [facility]   Overall Financial Resource Strain (CARDIA)    Difficulty of Paying Living Expenses: Not very hard  Food Insecurity: No Food Insecurity (07/04/2024)   Received from [facility]   Hunger Vital Sign    Within the past 12 months, you worried that your food would run out before you got the money to  buy more.: Never true    Within the past 12 months, the food you bought just didn't last and you didn't have money to get more.: Never true  Transportation Needs: Unmet Transportation Needs (07/04/2024)   Received from [facility] - Transportation    In the past 12 months, has lack of transportation kept you from medical appointments or from getting medications?: Yes    Lack of Transportation (Non-Medical): Yes  Physical Activity: Not on file  Stress: Not on file  Social Connections: Not on file    Current Medications:  Current Outpatient Medications:    ALPRAZolam  (XANAX ) 0.5 MG tablet, Take 1 tablet (0.5 mg total) by mouth daily as needed for anxiety, Disp: 30 tablet, Rfl: 5   Brimonidine Tartrate (LUMIFY) 0.025 % SOLN, Place 1 drop into both eyes daily as needed (red eyes (avg 4x/week))., Disp: , Rfl:    FLUoxetine  (PROZAC ) 20 MG tablet, Take 20 mg by mouth daily. (Patient taking differently: Take 20 mg by mouth daily. Takes 2 tablets once daily), Disp: , Rfl:    ibuprofen  (ADVIL ) 200 MG tablet, Take 400 mg by mouth every 8 (eight) hours as needed (pain.)., Disp: , Rfl:    lamoTRIgine  (LAMICTAL ) 200 MG tablet, Take 1 tablet (200 mg total) by mouth 2 (two) times daily., Disp: 60 tablet, Rfl: 2   levETIRAcetam  (KEPPRA ) 500 MG tablet, Take 1 tablet (500 mg total) by mouth 2 (two) times daily., Disp: 180 tablet, Rfl: 1   levothyroxine  (SYNTHROID ) 50 MCG tablet, Take 1 tablet (50 mcg total) by mouth daily before breakfast., Disp: 90  tablet, Rfl: 1   Midazolam  (NAYZILAM ) 5 MG/0.1ML SOLN, Place 5 mg into the nose as needed., Disp: 2 each, Rfl: 2   prenatal vitamin w/FE, FA (PRENATAL 1 + 1) 27-1 MG TABS tablet, Take 1 tablet by mouth 2 (two) times a week., Disp: , Rfl:    rosuvastatin  (CRESTOR ) 10 MG tablet, Take 1 tablet (10 mg total) by mouth daily., Disp: 90 tablet, Rfl: 3   zolpidem  (AMBIEN ) 10 MG tablet, Take 1 tablet (10 mg total) by mouth at bedtime as needed for sleep., Disp: 90 tablet, Rfl: 0  Review of Systems: On ROS intake form, + for joint pain, anxiety, seizures, depression, hot flashes at night, problem with walking, nervousness about upcoming ankle surgery Denies appetite changes, fevers, chills, fatigue, unexplained weight changes. Denies hearing loss, neck lumps or masses, mouth sores, ringing in ears or voice changes. Denies cough or wheezing.  Denies shortness of breath. Denies chest pain or palpitations. Denies leg swelling. Denies abdominal distention, pain, blood in stools, constipation, diarrhea, nausea, vomiting, or early satiety. Denies pain with intercourse, dysuria, frequency, hematuria or incontinence. Denies hot flashes, pelvic pain, vaginal bleeding or vaginal discharge.   Denies joint pain, back pain or muscle pain/cramps. Denies itching, rash, or wounds. Denies dizziness, headaches, numbness or seizures. Denies swollen lymph nodes or glands, denies easy bruising or bleeding. Denies anxiety, depression, confusion, or decreased concentration.  Physical Exam: BP 126/69 (BP Location: Right Arm, Patient Position: Sitting)   Pulse 92   Temp 98.4 F (36.9 C) (Oral)   Resp 16   Ht 5' 8 (1.727 m)   Wt 174 lb (78.9 kg)   SpO2 100%   BMI 26.46 kg/m  General: Alert, oriented, no acute distress. HEENT: Normocephalic, atraumatic, sclera anicteric. Chest: Clear to auscultation bilaterally.  No wheezes or rhonchi. Cardiovascular: Regular rate and rhythm, no murmurs. Abdomen: soft, nontender.  Normoactive bowel sounds.  No masses or hepatosplenomegaly appreciated. Well healed incisions. Extremities: Grossly normal range of motion.  Warm, well perfused.  No edema bilaterally. Skin: No rashes or lesions noted. GU: Normal appearing external genitalia without erythema, excoriation, or lesions.  Speculum exam reveals cuff intact, no visible lesions or masses.  No bleeding or discharge.  Moderate atrophy noted, minimal adhesions at apex, unchanged from prior visits.  On bimanual exam, cuff is smooth, no nodularity.  This is confirmed on rectovaginal exam. Thin Prep pap smear obtained of the vagina without difficulty.   Laboratory & Radiologic Studies: 08/2023: NILM pap, HR HPV negative  Assessment & Plan: Ashley Hammond is a 58 y.o. woman with a history of Stage IB3 SCC of the cervix status post completion surgery after pelvic radiation and one HDR treatment who presents for surveillance visit. Interval surgery 09/2021. CPS 0%   Patient is doing well from a gynecologic cancer standpoint and is NED on exam today.   Pap and HPV collected today. We will plan on yearly cotesting.  In regards to her GI symptoms of intermittent emesis, referral to GI recommended for work-up. She would like to wait on a referral to GI at this time.   Order for mammogram placed.   Per NCCN surveillance recommendations, the patient will continue with follow-up visits every 6 months alternating between my office and radiation oncology.  She will see Dr. Shannon in April.  We will see her back in 1 year.  If she were develop concerning symptoms or have findings on exam that were suspicious for recurrence, I stressed the importance of calling to let me know.    20 minutes of total time was spent for this patient encounter, including preparation, face-to-face counseling with the patient and coordination of care, and documentation of the encounter.  Comer Dollar, MD  Division of Gynecologic Oncology   Department of Obstetrics and Gynecology  [facility]

## 2024-07-29 NOTE — Patient Instructions (Signed)
 It was good seeing you today. No concerning findings for cervical cancer recurrence on today's exam.   We will contact you with the results of your pap smear from today. You may have light spotting after the pap today.   We will be thinking about you with your upcoming ankle surgery.  Plan for follow up in one year with Jaylenne Hamelin NP or sooner if needed. Please let us  know if you have any needs or new symptoms.  If and when you are ready for a referral to further evaluate the stomach issues (vomiting), we can place a referral to a gastroenterologist.   We have also placed an order for your mammogram so you can schedule this when ready.   Symptoms to report to your health care team include vaginal bleeding, rectal bleeding, bloating, weight loss without effort, new and persistent pain, new and  persistent fatigue, new leg swelling, new masses (i.e., bumps in your neck or groin), new and persistent cough, new and persistent nausea and vomiting, change in bowel or bladder habits, and any other concerns.

## 2024-08-02 ENCOUNTER — Other Ambulatory Visit (HOSPITAL_COMMUNITY): Payer: Self-pay

## 2024-08-03 LAB — CYTOLOGY - PAP
Comment: NEGATIVE
Diagnosis: NEGATIVE
High risk HPV: POSITIVE — AB

## 2024-08-04 ENCOUNTER — Ambulatory Visit: Payer: Self-pay | Admitting: Gynecologic Oncology

## 2024-08-04 NOTE — Progress Notes (Signed)
 sure

## 2024-08-04 NOTE — Progress Notes (Signed)
 Patient stated she has surgery on 10/15 for an ankle replacement. She will be in a cast for multiple weeks.  Can she come tomorrow at 12 pm?   Rosaline

## 2024-08-05 ENCOUNTER — Encounter: Payer: Self-pay | Admitting: Gynecologic Oncology

## 2024-08-05 ENCOUNTER — Inpatient Hospital Stay: Admitting: Gynecologic Oncology

## 2024-08-05 VITALS — BP 115/63 | HR 84 | Temp 97.7°F | Resp 16 | Wt 176.0 lb

## 2024-08-05 DIAGNOSIS — Z9079 Acquired absence of other genital organ(s): Secondary | ICD-10-CM | POA: Diagnosis not present

## 2024-08-05 DIAGNOSIS — N890 Mild vaginal dysplasia: Secondary | ICD-10-CM | POA: Diagnosis not present

## 2024-08-05 DIAGNOSIS — Z1151 Encounter for screening for human papillomavirus (HPV): Secondary | ICD-10-CM | POA: Diagnosis not present

## 2024-08-05 DIAGNOSIS — Z90722 Acquired absence of ovaries, bilateral: Secondary | ICD-10-CM | POA: Diagnosis not present

## 2024-08-05 DIAGNOSIS — Z8541 Personal history of malignant neoplasm of cervix uteri: Secondary | ICD-10-CM | POA: Diagnosis not present

## 2024-08-05 DIAGNOSIS — Z90710 Acquired absence of both cervix and uterus: Secondary | ICD-10-CM | POA: Diagnosis not present

## 2024-08-05 DIAGNOSIS — B977 Papillomavirus as the cause of diseases classified elsewhere: Secondary | ICD-10-CM

## 2024-08-05 DIAGNOSIS — R1110 Vomiting, unspecified: Secondary | ICD-10-CM | POA: Diagnosis not present

## 2024-08-05 DIAGNOSIS — Z9221 Personal history of antineoplastic chemotherapy: Secondary | ICD-10-CM | POA: Diagnosis not present

## 2024-08-05 DIAGNOSIS — C539 Malignant neoplasm of cervix uteri, unspecified: Secondary | ICD-10-CM

## 2024-08-05 DIAGNOSIS — Z923 Personal history of irradiation: Secondary | ICD-10-CM | POA: Diagnosis not present

## 2024-08-05 NOTE — Patient Instructions (Signed)
 I will let you know when I get biopsy results back next week.  If normal or low-grade, I will send you a MyChart message and we will plan to continue our follow-up as scheduled.  If biopsy shows high-grade precancer, I will call you to discuss treatment options.

## 2024-08-05 NOTE — Progress Notes (Signed)
 Gynecologic Oncology Return Clinic Visit  08/05/24  Reason for Visit: colposcopy  Treatment History: Oncology History Overview Note  PD-L1 CPS 0%   Malignant neoplasm of cervix (HCC)  05/20/2021 Initial Diagnosis   The patient reported a history of light vaginal bleeding since approximately May 2022.  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  Due to the abnormal bleeding that she was receiving she was then recommended to follow-up with her gynecologist, Dr. Jertson, who first evaluated the endometrium with an endometrial biopsy performed on 05/20/2021 which revealed benign inactive atrophic endometrium negative for hyperplasia or malignancy.  The bleeding continued and the patient returned for a colposcopic evaluation of the cervix with biopsies (due to the presence of high risk HPV).  This took place on 06/17/2021.  At the time of that colposcopic evaluation there was some abnormal bleeding noted and friability to the cervix.  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   05/20/2021 Pathology Results   FINAL MICROSCOPIC DIAGNOSIS:   A. ENDOMETRIUM, BIOPSY:  - Benign inactive atrophic endometrium  - Negative for hyperplasia or malignancy   05/30/2021 Imaging   US  pelvis Small anteverted uterus Thin, symmetrical endometrium Normal ovaries bilaterally No free fluid.   06/17/2021 Pathology Results   FINAL MICROSCOPIC DIAGNOSIS:   A. CERVIX, 6 O'CLOCK, BIOPSY:  -  Invasive squamous cell carcinoma  -  See comment   B. ENDOCERVIX, CURETTAGE:  -  Squamous cell carcinoma  -  See comment   COMMENT:   A.  The carcinoma is positive for p16 and p40 supporting the diagnosis of invasive squamous cell carcinoma.      07/11/2021 PET scan   Intense hypermetabolic activity associated with ill-defined nodular enlargement of the cervix/lower uterine segment, consistent with patient's biopsy proven cervical neoplasm.   No definite scintigraphic evidence of metastatic disease in the neck, chest, abdomen or pelvis.   Right middle lobe 4-5 mm perifissural pulmonary nodule unchanged in size from October 25, 2020 and below the resolution of PET-CT but favored to reflect a peri- fissural lymph node, attention on follow-up studies suggested   Nodular focus of hypermetabolic activity along the right anterior sublingual region, which is nonspecific correlation with direct visualization is suggested.   Hypermetabolic hyperplasia of the tonsils with a hypermetabolic 5 mm left level 1b cervical lymph node, favored reactive.   Hypermetabolic activity associated with callus formation in the right anterior sixth rib likely representing a healing nonpathologic rib fracture.   07/19/2021 Initial Diagnosis   Malignant neoplasm of cervix (HCC)   07/23/2021 Cancer Staging   Staging form: Cervix Uteri, AJCC Version 9 - Clinical stage from 07/23/2021: FIGO Stage IB3 (cT1b3, cN0, cM0) - Signed by Lonn Hicks, MD on 07/23/2021 Stage prefix: Initial diagnosis   07/25/2021 Imaging   1. No acute intracranial abnormality. 2. Minimal multifocal hyperintense T2-weight signal within the white matter, nonspecific. This may be seen in the setting of chronic small vessel disease or migraine headaches.     07/25/2021 Imaging   CT head No acute intracranial abnormality.   07/30/2021 Procedure   Successful placement of a power injectable Port-A-Cath via the right internal jugular vein. The catheter is ready for immediate use.     08/02/2021 - 08/30/2021  Chemotherapy   Patient is on Treatment Plan : Cervical Cisplatin  q7d     10/24/2021 Surgery   TRH/BSO  Findings: On EUA, small mobile uterus. What appears to be residual tumor  (approximately 1cm) replaces the cervical os. On intra-abdominal entry, normal upper abdominal survey. Normal omentum, small and large bowel. Uterus 6cm, normal appearing adnexa. Mild retroperitoneal edema and fibrosis given recent RT. Moderate adhesions between bladder and cervix. Good visual margin taken around the cervix while trying to preserve as much vaginal length as possible. No intra-abdominal or pelvic evidence of disease.   10/24/2021 Pathology Results   A. UTERUS, CERVIX, BILATERAL FALLOPIAN TUBES AND OVARIES:  - Invasive adenosquamous carcinoma of the cervix, see comment  - Carcinoma invades for depth of 0.6 cm  - Resection margins are negative for carcinoma  - Endometriosis of the cervix  - Uterus with benign inactive endometrium  - Benign unremarkable bilateral fallopian tubes and ovaries  - See oncology table      COMMENT:   Most of the tumor shows a squamous morphology with patchy areas of  glandular differentiation. Immunostains show that the tumor cells are  positive for p16, CK5/6 and show evidence of mucin production with  mucicarmine special stain.  Immunostain for p63 is negative.  This  immunoprofile is consistent with above interpretation.     ONCOLOGY TABLE:   UTERINE CERVIX, CARCINOMA: Resection   Procedure: Total hysterectomy and bilateral salpingo-oophorectomy  Tumor Size: 0.8 cm (as measured on HE glass slide)  Histologic Type: Adenosquamous carcinoma  Histologic Grade: G3: Poorly differentiated  Stromal Invasion: Present       Depth of stromal invasion (mm): 6 mm  Other Tissue/ Organ: Not applicable  Margins: All margins negative for invasive carcinoma  Margin Status for HSIL or AIS: All margins negative for HSIL or AIS  Lymphovascular invasion: Not identified       Regional Lymph Nodes: Not applicable (no lymph nodes submitted or  found)  Distant Metastasis:       Distant sites involved: Not applicable  Pathologic Stage Classification (pTNM,  AJCC 8th Edition): pT1b1, pN not  assigned  Ancillary Studies: Can be performed upon request  Representative Tumor Block: A6    01/06/2022 Procedure   Removal of implanted Port-A-Cath utilizing sharp and blunt dissection. The procedure was uncomplicated.     02/13/2022 Imaging   CT A/P: 1. No evidence of metastatic disease in the abdomen or pelvis. 2. Small hiatal hernia. 3. Congenital small bowel malrotation.  No acute abnormality.     Interval History: Doing well.  Past Medical/Surgical History: Past Medical History:  Diagnosis Date   Anxiety    Arthritis    Arthritis of ankle    Right   Cervical cancer (HCC) 2020   Depression    Episode of loss of consciousness    History of radiation therapy    Pelvis 07/31/2021-09/03/2021  HDR tandem and ring 09/26/2021  Dr Lynwood Nasuti   Hypothyroidism    Insomnia    Pure hypercholesterolemia    Seizure [facility])     Past Surgical History:  Procedure Laterality Date   ABDOMINAL HYSTERECTOMY     ANKLE SURGERY Right    [facility]   IR IMAGING GUIDED PORT INSERTION  07/30/2021   IR REMOVAL TUN ACCESS W/ PORT W/O FL MOD SED  01/03/2022   OPERATIVE ULTRASOUND N/A 09/26/2021   Procedure: OPERATIVE ULTRASOUND;  Surgeon: Nasuti Lynwood, MD;  Location: [facility];  Service: Urology;  Laterality: N/A;  ROBOTIC ASSISTED TOTAL HYSTERECTOMY WITH BILATERAL SALPINGO OOPHERECTOMY Bilateral 10/24/2021   Procedure: XI ROBOTIC ASSISTED TOTAL HYSTERECTOMY WITH BILATERAL SALPINGO OOPHORECTOMY;  Surgeon: Viktoria Comer SAUNDERS, MD;  Location: [facility];  Service: Gynecology;  Laterality: Bilateral;   TANDEM RING INSERTION N/A 09/26/2021   Procedure: TANDEM RING INSERTION;  Surgeon: Shannon Agent, MD;  Location: [facility];  Service: Urology;  Laterality: N/A;   WISDOM TOOTH EXTRACTION      Family History  Problem Relation Age of Onset   Colon polyps Mother    Heart attack Father    Diabetes Brother    Pancreatic cancer Maternal Grandmother    Sudden death Neg Hx     Hypertension Neg Hx    Breast cancer Neg Hx    Colon cancer Neg Hx    Seizures Neg Hx     Social History   Socioeconomic History   Marital status: Married    Spouse name: Not on file   Number of children: 2   Years of education: Not on file   Highest education level: Not on file  Occupational History   Not on file  Tobacco Use   Smoking status: Former    Current packs/day: 0.00    Average packs/day: 0.3 packs/day for 25.0 years (6.3 ttl pk-yrs)    Types: Cigarettes    Start date: 6    Quit date: 2016    Years since quitting: 9.7   Smokeless tobacco: Never   Tobacco comments:    20+ yrs  Vaping Use   Vaping status: Never Used  Substance and Sexual Activity   Alcohol use: Yes    Alcohol/week: 14.0 standard drinks of alcohol    Types: 14 Glasses of wine per week    Comment: 2 glasses of wine a day   Drug use: Yes    Frequency: 7.0 times per week    Types: Marijuana    Comment: Last use 07-10-21   Sexual activity: Not Currently    Birth control/protection: Post-menopausal  Other Topics Concern   Not on file  Social History Narrative   Lives alone   R handed   Caffeine: 1 C of coffee a day   Retired    Teacher, early years/pre Strain: Low Risk  (07/04/2024)   Received from [facility]   Overall Financial Resource Strain (CARDIA)    Difficulty of Paying Living Expenses: Not very hard  Food Insecurity: No Food Insecurity (07/04/2024)   Received from [facility]   Hunger Vital Sign    Within the past 12 months, you worried that your food would run out before you got the money to buy more.: Never true    Within the past 12 months, the food you bought just didn't last and you didn't have money to get more.: Never true  Transportation Needs: Unmet Transportation Needs (07/04/2024)   Received [facility]   PRAPARE - Transportation    In the past 12 months, has lack of transportation kept you  from medical appointments or from getting medications?: Yes    Lack of Transportation (Non-Medical): Yes  Physical Activity: Not on file  Stress: Not on file  Social Connections: Not on file    Current Medications:  Current Outpatient Medications:    ALPRAZolam  (XANAX ) 0.5 MG tablet, Take 1 tablet (0.5 mg total) by mouth daily as needed for anxiety, Disp: 30 tablet, Rfl: 5   Brimonidine Tartrate (LUMIFY) 0.025 %  SOLN, Place 1 drop into both eyes daily as needed (red eyes (avg 4x/week))., Disp: , Rfl:    FLUoxetine  (PROZAC ) 20 MG tablet, Take 20 mg by mouth daily. (Patient taking differently: Take 20 mg by mouth daily. Takes 2 tablets once daily), Disp: , Rfl:    ibuprofen  (ADVIL ) 200 MG tablet, Take 400 mg by mouth every 8 (eight) hours as needed (pain.)., Disp: , Rfl:    lamoTRIgine  (LAMICTAL ) 200 MG tablet, Take 1 tablet (200 mg total) by mouth 2 (two) times daily., Disp: 60 tablet, Rfl: 2   levETIRAcetam  (KEPPRA ) 500 MG tablet, Take 1 tablet (500 mg total) by mouth 2 (two) times daily., Disp: 180 tablet, Rfl: 1   levothyroxine  (SYNTHROID ) 50 MCG tablet, Take 1 tablet (50 mcg total) by mouth daily before breakfast., Disp: 90 tablet, Rfl: 1   Midazolam  (NAYZILAM ) 5 MG/0.1ML SOLN, Place 5 mg into the nose as needed., Disp: 2 each, Rfl: 2   prenatal vitamin w/FE, FA (PRENATAL 1 + 1) 27-1 MG TABS tablet, Take 1 tablet by mouth 2 (two) times a week., Disp: , Rfl:    rosuvastatin  (CRESTOR ) 10 MG tablet, Take 1 tablet (10 mg total) by mouth daily., Disp: 90 tablet, Rfl: 3   zolpidem  (AMBIEN ) 10 MG tablet, Take 1 tablet (10 mg total) by mouth at bedtime as needed for sleep., Disp: 90 tablet, Rfl: 0  Review of Systems: Denies appetite changes, fevers, chills, fatigue, unexplained weight changes. Denies hearing loss, neck lumps or masses, mouth sores, ringing in ears or voice changes. Denies cough or wheezing.  Denies shortness of breath. Denies chest pain or palpitations. Denies leg  swelling. Denies abdominal distention, pain, blood in stools, constipation, diarrhea, nausea, vomiting, or early satiety. Denies pain with intercourse, dysuria, frequency, hematuria or incontinence. Denies hot flashes, pelvic pain, vaginal bleeding or vaginal discharge.   Denies joint pain, back pain or muscle pain/cramps. Denies itching, rash, or wounds. Denies dizziness, headaches, numbness or seizures. Denies swollen lymph nodes or glands, denies easy bruising or bleeding. Denies anxiety, depression, confusion, or decreased concentration.  Physical Exam: BP 115/63 (BP Location: Right Arm, Patient Position: Sitting)   Pulse 84   Temp 97.7 F (36.5 C) (Oral)   Resp 16   Wt 176 lb (79.8 kg)   SpO2 100%   BMI 26.76 kg/m  General: Alert, oriented, no acute distress. HEENT: Posterior oropharynx clear, sclera anicteric. Chest: Unlabored breathing on room air.  GU: Normal appearing external genitalia without erythema, excoriation, or lesions.  Speculum exam reveals mildly atrophic vaginal mucosa, radiation changes noted at the apex.  Colposcopy findings noted below.    Colposcopy, vaginal biopsy procedure Preoperative diagnosis: HR HPV infection on recent pap, history of cervical cancer Postoperative diagnosis: Same as above Physician: Viktoria MD Estimated blood loss: Minimal Specimens: Vaginal cuff biopsy Procedure: After the procedure was discussed with the patient including risks and benefits, she gave verbal consent.  She was then placed in dorsolithotomy position and a speculum was placed in the vagina.  Once the upper vagina and cuff was visualized, 5% acetic acid was applied.  Colposcopy was then performed with findings of diffuse mild acetowhite, some punctations thought to be related to radiation changes.  Lugol's was then applied with mild diffuse decreased uptake at the vaginal apex.  Tischler forceps were then used with the patient's permission to biopsy the posterior aspect  along the midportion of the cuff.  Biopsy was placed in formalin.  Silver nitrate was used to achieve  hemostasis. Overall the patient tolerated the procedure well.  All instruments were removed from the vagina.  Laboratory & Radiologic Studies: 10/3: Pap NILM, HR HPV +  Assessment & Plan: Ashley Hammond is a 58 y.o. woman with a history of Stage IB3 SCC of the cervix status post completion surgery after pelvic radiation and one HDR treatment who presents for surveillance visit. Interval surgery 09/2021. CPS 0%  Colposcopy performed today.  Vaginal biopsy taken.  I will contact her with these results.  Discussed that if normal or low-grade, we will continue with close surveillance as planned.  If high-grade, discussed possible treatment including topical treatment and surgical excision versus laser.  If biopsy shows high-grade dysplasia, I will call her to discuss treatment options again and to make some decisions moving forward.  20 minutes of total time was spent for this patient encounter, including preparation, face-to-face counseling with the patient and coordination of care, and documentation of the encounter.  Comer Dollar, MD  Division of Gynecologic Oncology  Department of Obstetrics and Gynecology  [facility]

## 2024-08-06 ENCOUNTER — Other Ambulatory Visit (HOSPITAL_COMMUNITY): Payer: Self-pay

## 2024-08-07 ENCOUNTER — Other Ambulatory Visit (HOSPITAL_COMMUNITY): Payer: Self-pay

## 2024-08-08 ENCOUNTER — Telehealth: Payer: Self-pay | Admitting: Neurology

## 2024-08-08 ENCOUNTER — Other Ambulatory Visit (HOSPITAL_COMMUNITY): Payer: Self-pay

## 2024-08-08 ENCOUNTER — Other Ambulatory Visit: Payer: Self-pay

## 2024-08-08 MED ORDER — ACETAMINOPHEN 500 MG PO TABS
1000.0000 mg | ORAL_TABLET | Freq: Three times a day (TID) | ORAL | 0 refills | Status: AC | PRN
  Filled 2024-08-08: qty 30, 5d supply, fill #0

## 2024-08-08 MED ORDER — ASPIRIN 325 MG PO TBEC
325.0000 mg | DELAYED_RELEASE_TABLET | Freq: Every day | ORAL | 1 refills | Status: AC
  Filled 2024-08-08: qty 30, 30d supply, fill #0

## 2024-08-08 MED ORDER — OXYCODONE HCL 5 MG PO TABS
5.0000 mg | ORAL_TABLET | ORAL | 0 refills | Status: AC | PRN
  Filled 2024-08-08: qty 20, 4d supply, fill #0

## 2024-08-08 MED ORDER — IBUPROFEN 800 MG PO TABS
800.0000 mg | ORAL_TABLET | Freq: Three times a day (TID) | ORAL | 2 refills | Status: AC | PRN
  Filled 2024-08-08: qty 30, 10d supply, fill #0

## 2024-08-08 NOTE — Telephone Encounter (Signed)
 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 Please advise.

## 2024-08-09 ENCOUNTER — Ambulatory Visit: Payer: Self-pay | Admitting: Gynecologic Oncology

## 2024-08-09 LAB — SURGICAL PATHOLOGY

## 2024-08-10 ENCOUNTER — Other Ambulatory Visit (HOSPITAL_COMMUNITY): Payer: Self-pay

## 2024-08-11 ENCOUNTER — Other Ambulatory Visit (HOSPITAL_COMMUNITY): Payer: Self-pay

## 2024-08-12 ENCOUNTER — Other Ambulatory Visit: Payer: Self-pay

## 2024-08-12 ENCOUNTER — Encounter: Admitting: Internal Medicine

## 2024-08-12 ENCOUNTER — Other Ambulatory Visit (HOSPITAL_COMMUNITY): Payer: Self-pay

## 2024-08-12 MED ORDER — FLUOXETINE HCL 20 MG PO CAPS
20.0000 mg | ORAL_CAPSULE | Freq: Every morning | ORAL | 2 refills | Status: AC
  Filled 2024-08-12: qty 30, 30d supply, fill #0
  Filled 2024-09-06 – 2024-09-26 (×3): qty 30, 30d supply, fill #1

## 2024-08-25 ENCOUNTER — Other Ambulatory Visit: Payer: Self-pay | Admitting: Internal Medicine

## 2024-08-25 ENCOUNTER — Other Ambulatory Visit (HOSPITAL_COMMUNITY): Payer: Self-pay

## 2024-08-25 MED ORDER — LEVOTHYROXINE SODIUM 50 MCG PO TABS
50.0000 ug | ORAL_TABLET | Freq: Every day | ORAL | 1 refills | Status: AC
  Filled 2024-08-25: qty 30, 30d supply, fill #0
  Filled 2024-09-20: qty 30, 30d supply, fill #1
  Filled 2024-10-30: qty 30, 30d supply, fill #2
  Filled 2024-11-26: qty 30, 30d supply, fill #3

## 2024-08-26 ENCOUNTER — Other Ambulatory Visit: Payer: Self-pay

## 2024-08-26 ENCOUNTER — Other Ambulatory Visit (HOSPITAL_COMMUNITY): Payer: Self-pay

## 2024-08-26 MED ORDER — LAMOTRIGINE 200 MG PO TABS
200.0000 mg | ORAL_TABLET | Freq: Two times a day (BID) | ORAL | 2 refills | Status: AC
  Filled 2024-08-26: qty 60, 30d supply, fill #0
  Filled 2024-09-20: qty 60, 30d supply, fill #1

## 2024-08-31 ENCOUNTER — Other Ambulatory Visit: Payer: Self-pay

## 2024-08-31 ENCOUNTER — Other Ambulatory Visit (HOSPITAL_COMMUNITY): Payer: Self-pay

## 2024-08-31 MED ORDER — FLUOXETINE HCL 20 MG PO CAPS
60.0000 mg | ORAL_CAPSULE | Freq: Every morning | ORAL | 2 refills | Status: AC
  Filled 2024-08-31: qty 90, 30d supply, fill #0
  Filled 2024-10-06: qty 90, 30d supply, fill #1
  Filled 2024-11-26: qty 90, 30d supply, fill #2

## 2024-09-06 ENCOUNTER — Other Ambulatory Visit (HOSPITAL_COMMUNITY): Payer: Self-pay

## 2024-09-07 ENCOUNTER — Other Ambulatory Visit: Payer: Self-pay

## 2024-09-19 ENCOUNTER — Telehealth: Payer: Self-pay | Admitting: Neurology

## 2024-09-19 NOTE — Telephone Encounter (Signed)
    The patient's orthopedic surgery team requested medical clearance for  ankle and food surgery.  I will share this documentation with PCP:  Dr Perri.  Cc Helene HERO Schweitzer Mickey, MD    Plan : 1)  Keppra  will be converted form PO dose to Iv dose the morning of the surgery.  She can then resume  po medication after surgery as soon as safely able to swallow.    2) Lamictal  400 mg bid po , the PM dose of  Lamictal  dose should be taken later on the night prior to surgery, around midnight ( 400 mg) and resume after surgery at the usual time, skipping one AM dose.   3) the patient has emergency / rescue Xanax  medication.   Dedra Gores, MD

## 2024-09-19 NOTE — Telephone Encounter (Signed)
 Clearance form signed, faxed back. Confirmation received.

## 2024-09-20 ENCOUNTER — Other Ambulatory Visit (HOSPITAL_COMMUNITY): Payer: Self-pay

## 2024-09-20 ENCOUNTER — Other Ambulatory Visit: Payer: Self-pay

## 2024-09-26 ENCOUNTER — Other Ambulatory Visit (HOSPITAL_COMMUNITY): Payer: Self-pay

## 2024-09-26 ENCOUNTER — Other Ambulatory Visit: Payer: Self-pay

## 2024-09-26 DIAGNOSIS — F330 Major depressive disorder, recurrent, mild: Secondary | ICD-10-CM | POA: Diagnosis not present

## 2024-09-26 DIAGNOSIS — F332 Major depressive disorder, recurrent severe without psychotic features: Secondary | ICD-10-CM | POA: Diagnosis not present

## 2024-09-26 DIAGNOSIS — Z5181 Encounter for therapeutic drug level monitoring: Secondary | ICD-10-CM | POA: Diagnosis not present

## 2024-09-26 DIAGNOSIS — G40A11 Absence epileptic syndrome, intractable, with status epilepticus: Secondary | ICD-10-CM | POA: Diagnosis not present

## 2024-09-26 MED ORDER — FLUOXETINE HCL 20 MG PO CAPS
60.0000 mg | ORAL_CAPSULE | Freq: Every morning | ORAL | 1 refills | Status: AC
  Filled 2024-09-26: qty 90, 30d supply, fill #0
  Filled 2024-10-30: qty 270, 90d supply, fill #0

## 2024-09-26 MED ORDER — LAMOTRIGINE 200 MG PO TABS
200.0000 mg | ORAL_TABLET | Freq: Two times a day (BID) | ORAL | 1 refills | Status: AC
  Filled 2024-10-30: qty 180, 90d supply, fill #0

## 2024-09-26 MED ORDER — ALPRAZOLAM 0.5 MG PO TABS
0.5000 mg | ORAL_TABLET | Freq: Every day | ORAL | 5 refills | Status: AC | PRN
  Filled 2024-09-26: qty 30, 30d supply, fill #0

## 2024-09-26 MED ORDER — ZOLPIDEM TARTRATE 10 MG PO TABS
ORAL_TABLET | ORAL | 5 refills | Status: AC
  Filled 2024-10-30: qty 30, 30d supply, fill #0

## 2024-09-27 ENCOUNTER — Other Ambulatory Visit (HOSPITAL_COMMUNITY): Payer: Self-pay

## 2024-10-06 ENCOUNTER — Other Ambulatory Visit (HOSPITAL_COMMUNITY): Payer: Self-pay

## 2024-10-17 ENCOUNTER — Other Ambulatory Visit (HOSPITAL_COMMUNITY): Payer: Self-pay

## 2024-10-31 ENCOUNTER — Other Ambulatory Visit: Payer: Self-pay

## 2024-10-31 ENCOUNTER — Other Ambulatory Visit (HOSPITAL_COMMUNITY): Payer: Self-pay

## 2024-11-04 ENCOUNTER — Other Ambulatory Visit (HOSPITAL_COMMUNITY): Payer: Self-pay

## 2024-11-07 ENCOUNTER — Other Ambulatory Visit: Payer: Self-pay

## 2024-11-14 NOTE — Progress Notes (Signed)
 CLORISSA GRUENBERG                                          MRN: [id number]   11/14/2024   The [facility] Quality Team Specialist reviewed this patient medical record for the purposes of chart review for care gap closure. The following were reviewed: chart review for care gap closure-breast cancer screening.    [facility] Quality Team

## 2024-11-26 ENCOUNTER — Other Ambulatory Visit (HOSPITAL_COMMUNITY): Payer: Self-pay

## 2024-11-27 ENCOUNTER — Other Ambulatory Visit (HOSPITAL_COMMUNITY): Payer: Self-pay

## 2024-11-28 ENCOUNTER — Other Ambulatory Visit: Payer: Self-pay

## 2024-12-01 ENCOUNTER — Ambulatory Visit: Payer: 59 | Admitting: Neurology

## 2024-12-19 ENCOUNTER — Ambulatory Visit: Payer: 59 | Admitting: Radiation Oncology

## 2025-02-16 ENCOUNTER — Ambulatory Visit: Payer: Self-pay | Admitting: Radiation Oncology

## 2025-05-16 ENCOUNTER — Ambulatory Visit: Admitting: Neurology

## 2025-08-15 ENCOUNTER — Ambulatory Visit: Admitting: Gynecologic Oncology
